# Patient Record
Sex: Male | Born: 1965 | Race: White | Hispanic: No
Health system: Western US, Academic
[De-identification: ages and names within clinical notes are randomized; demographics above are authoritative.]

## PROBLEM LIST (undated history)

## (undated) DIAGNOSIS — M459 Ankylosing spondylitis of unspecified sites in spine: Secondary | ICD-10-CM

## (undated) DIAGNOSIS — G4730 Sleep apnea, unspecified: Secondary | ICD-10-CM

## (undated) DIAGNOSIS — G2581 Restless legs syndrome: Secondary | ICD-10-CM

## (undated) DIAGNOSIS — K219 Gastro-esophageal reflux disease without esophagitis: Secondary | ICD-10-CM

## (undated) DIAGNOSIS — J3089 Other allergic rhinitis: Secondary | ICD-10-CM

## (undated) HISTORY — DX: Restless legs syndrome: G25.81

## (undated) HISTORY — PX: PR UNLISTED PROCEDURE SHOULDER: 23929

## (undated) HISTORY — DX: Other allergic rhinitis: J30.89

## (undated) HISTORY — DX: Sleep apnea, unspecified: G47.30

## (undated) HISTORY — PX: PR CHOLECYSTECTOMY: 47600

## (undated) HISTORY — DX: Ankylosing spondylitis of unspecified sites in spine: M45.9

## (undated) HISTORY — DX: Gastro-esophageal reflux disease without esophagitis: K21.9

## (undated) MED ORDER — LOSARTAN POTASSIUM 50 MG OR TABS
ORAL_TABLET | ORAL | Status: AC
Start: 2012-03-17 — End: ?

## (undated) MED ORDER — TIZANIDINE HCL 4 MG OR TABS
4.0000 mg | ORAL_TABLET | Freq: Three times a day (TID) | ORAL | 0 refills | Status: AC | PRN
Start: 2023-09-30 — End: ?

## (undated) MED ORDER — TIZANIDINE HCL 4 MG OR TABS
4.0000 mg | ORAL_TABLET | Freq: Three times a day (TID) | ORAL | 0 refills | Status: AC | PRN
Start: 2023-10-07 — End: ?

## (undated) MED ORDER — METFORMIN HCL ER 500 MG OR TB24
EXTENDED_RELEASE_TABLET | ORAL | Status: AC
Start: 2015-09-18 — End: ?

## (undated) MED ORDER — TIZANIDINE HCL 4 MG OR TABS
4.0000 mg | ORAL_TABLET | Freq: Three times a day (TID) | ORAL | 0 refills | Status: AC | PRN
Start: 2023-10-13 — End: ?

## (undated) MED ORDER — CLARITHROMYCIN 500 MG OR TABS
ORAL_TABLET | ORAL | Status: AC
Start: 2014-10-18 — End: ?

## (undated) MED ORDER — TIZANIDINE HCL 4 MG OR TABS
4.0000 mg | ORAL_TABLET | Freq: Three times a day (TID) | ORAL | 0 refills | Status: AC | PRN
Start: 2023-10-09 — End: ?

---

## 2006-06-24 HISTORY — PX: PR COLONOSCOPY STOMA DX INCLUDING COLLJ SPEC SPX: 44388

## 2006-06-24 HISTORY — PX: PR ESOPHAGOGASTRODUODENOSCOPY TRANSORAL DIAGNOSTIC: 43235

## 2006-11-18 ENCOUNTER — Encounter (INDEPENDENT_AMBULATORY_CARE_PROVIDER_SITE_OTHER): Payer: Self-pay | Admitting: Family Medicine

## 2006-11-18 DIAGNOSIS — R197 Diarrhea, unspecified: Secondary | ICD-10-CM

## 2007-05-31 ENCOUNTER — Encounter (INDEPENDENT_AMBULATORY_CARE_PROVIDER_SITE_OTHER): Payer: Self-pay | Admitting: Family Medicine

## 2007-07-14 ENCOUNTER — Encounter (INDEPENDENT_AMBULATORY_CARE_PROVIDER_SITE_OTHER): Payer: Self-pay | Admitting: Family Medicine

## 2007-07-14 DIAGNOSIS — M25519 Pain in unspecified shoulder: Secondary | ICD-10-CM

## 2007-12-13 IMAGING — CT CT ABDOMEN W/ CM
2 of 5 series · 17 of 46 positions shown, 19 images · IV contrast (READICAT/WATER & [ID] OMNI 300)
Comparison: None.

CLINICAL DATA: Left sided abdominal and pelvic pain. 
 ABDOMEN CT WITH CONTRAST:
TECHNIQUE: Multidetector CT imaging of the abdomen was performed following the standard protocol during bolus administration of intravenous contrast.
 Contrast:  888cc Omnipaque 300 and oral contrast.
TECHNIQUE: Multidetector CT imaging of the pelvis was performed following the standard protocol during bolus administration of intravenous contrast.

[Series 3: routine abdomen · axial · 0.78mm/px · z∈[-455,-40]mm · 14 of 92 slices shown, 16 images]
[im 5/92  soft-tissue]
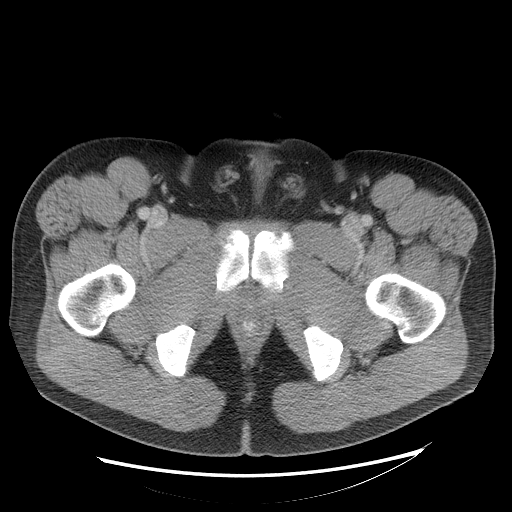
[im 5/92  bone]
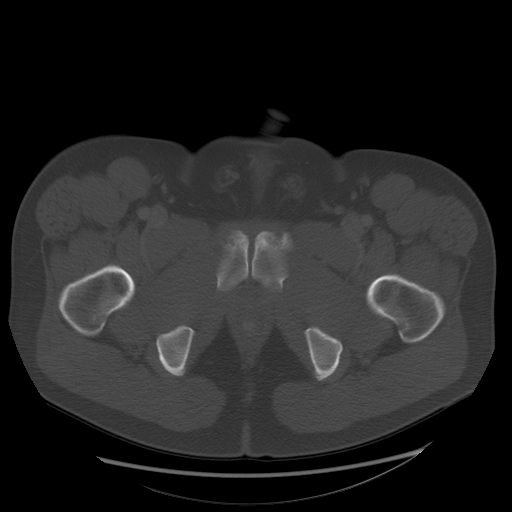
[im 10/92  soft-tissue]
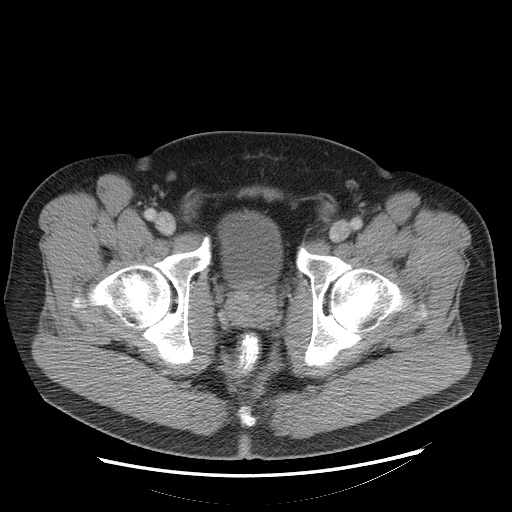
[im 20/92  soft-tissue]
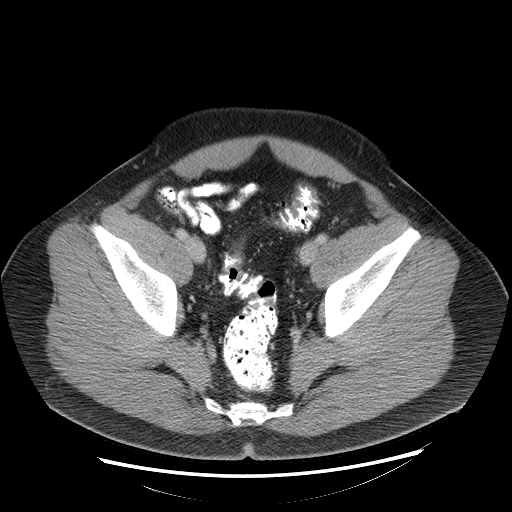
[im 24/92  soft-tissue]
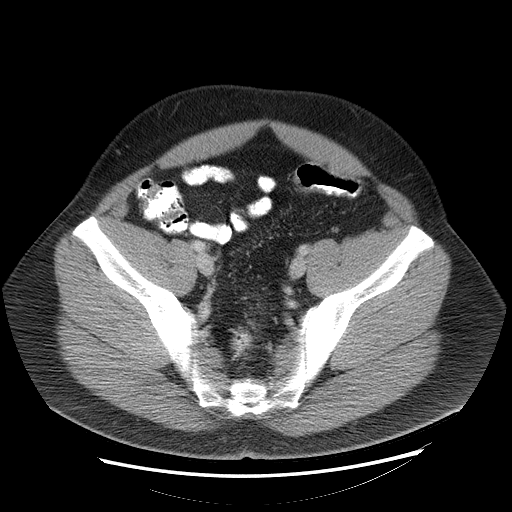
[im 29/92  soft-tissue]
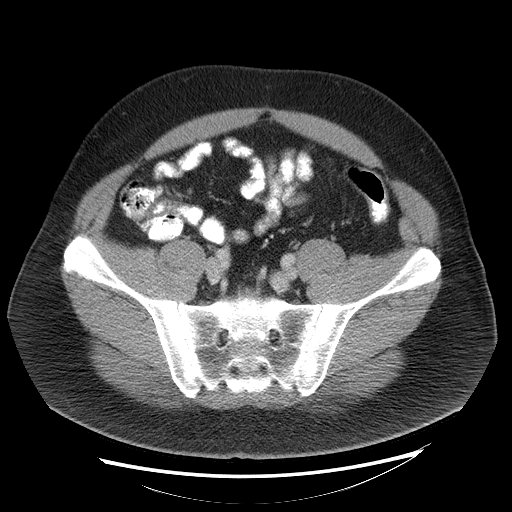
[im 39/92  soft-tissue]
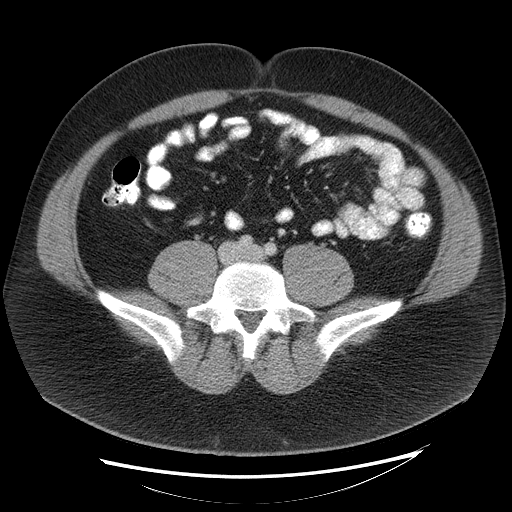
[im 44/92  soft-tissue]
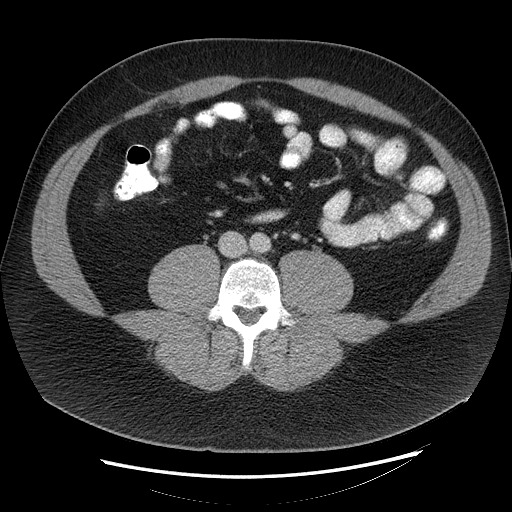
[im 48/92  soft-tissue]
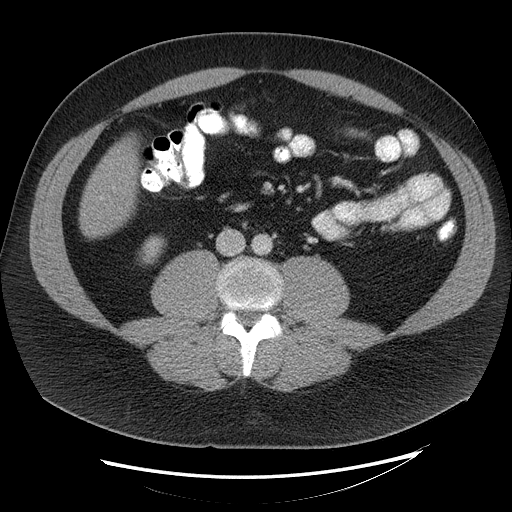
[im 53/92  soft-tissue]
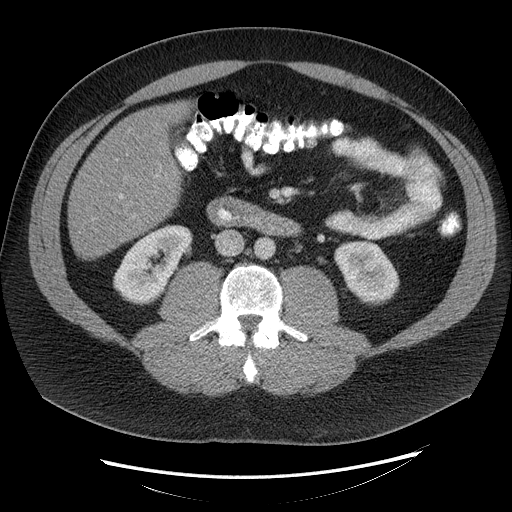
[im 53/92  bone]
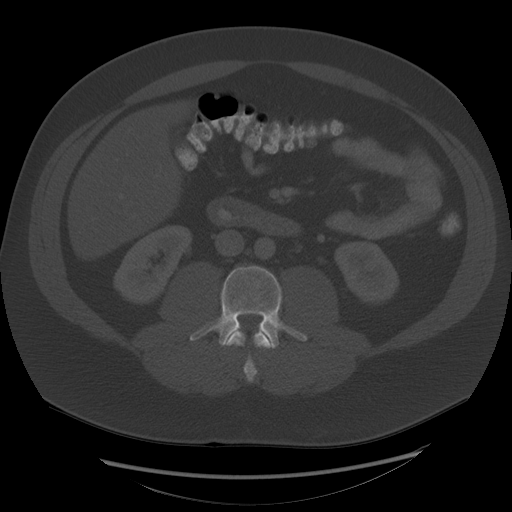
[im 63/92  soft-tissue]
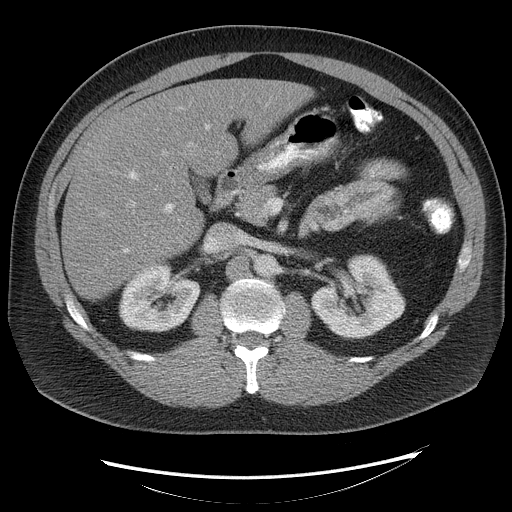
[im 68/92  soft-tissue]
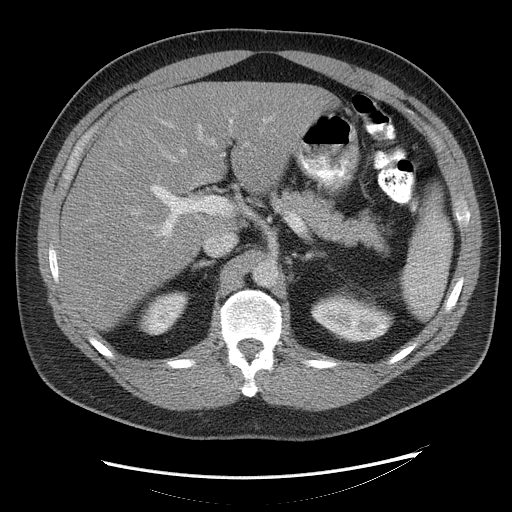
[im 72/92  soft-tissue]
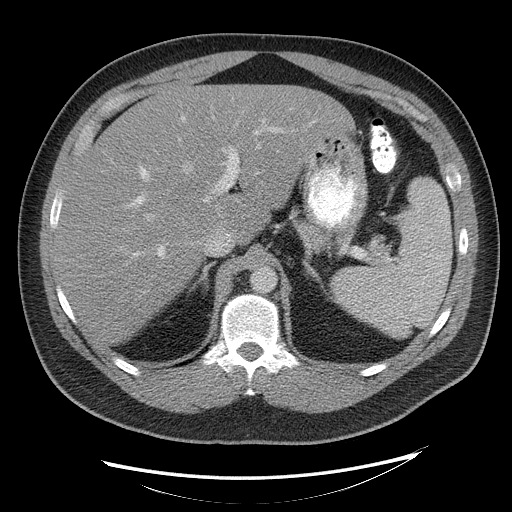
[im 82/92  soft-tissue]
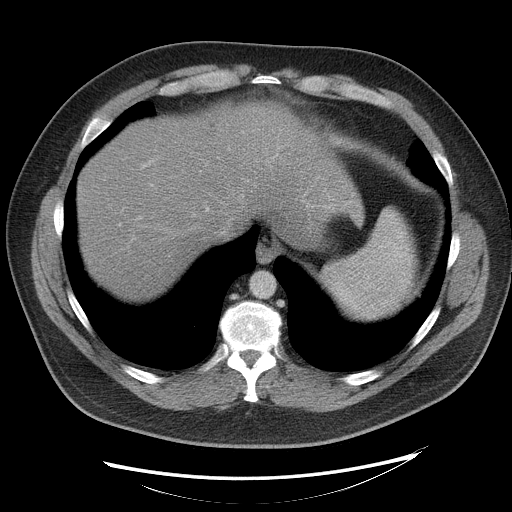
[im 87/92  soft-tissue]
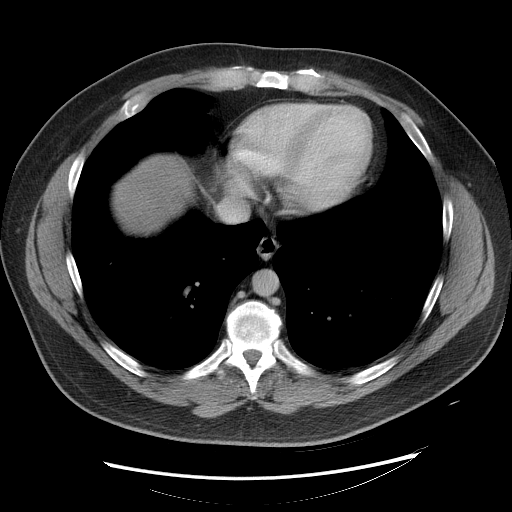

[Series 602: sagittal body · sagittal · 0.92mm/px · 3 of 161 slices shown]
[im 54/161  soft-tissue]
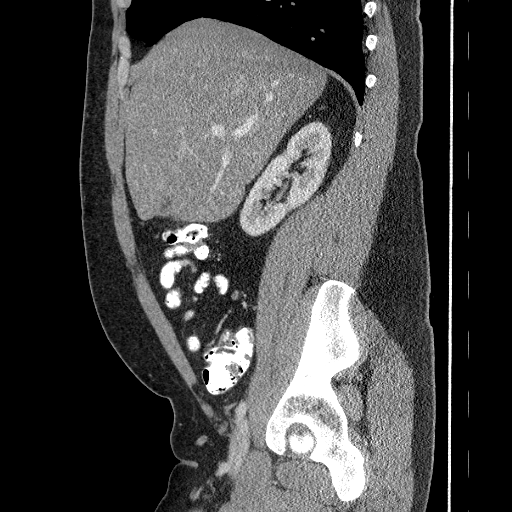
[im 72/161  soft-tissue]
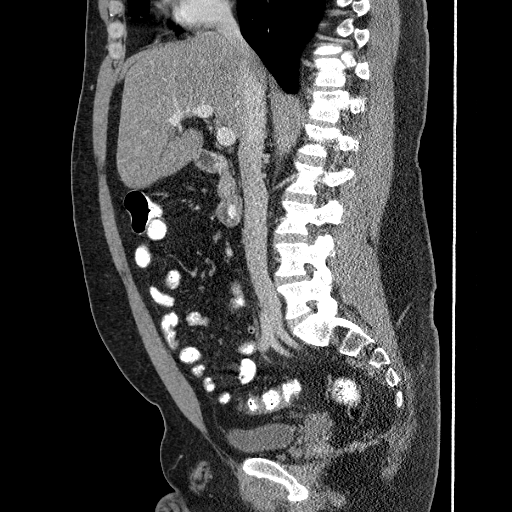
[im 89/161  soft-tissue]
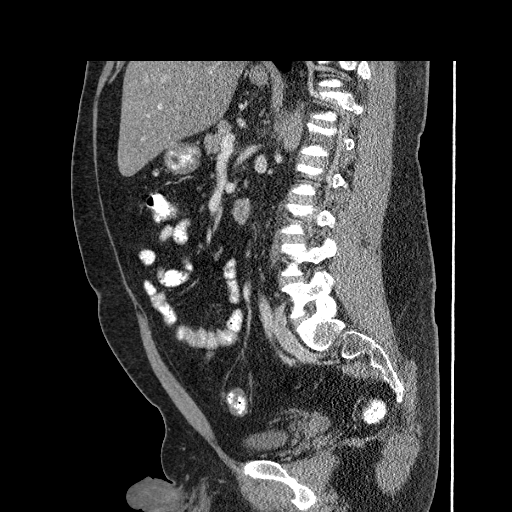

[17 of 46 positions shown; findings below may reference images not displayed]

FINDINGS: Diffuse fatty infiltration of the liver is noted however no focal liver lesions are identified.  The gallbladder is unremarkable and there is no evidence of biliary ductal dilatation.  The spleen, pancreas, adrenal glands, and kidneys are normal in appearance.  
 There is no evidence of abnormal soft tissue masses or adenopathy.  There is no evidence of inflammatory process or abnormal fluid collections within the abdomen.  Abdominal bowel loops are unremarkable.
IMPRESSION: 1.  No acute findings. 
 2.  Diffuse fatty infiltration of the liver. 
 PELVIS CT WITH CONTRAST:
FINDINGS: There is no evidence of pelvic mass or adenopathy.  There is no evidence of inflammatory process or abnormal fluid collection.  Pelvic bowel loops and other soft tissue structures are unremarkable.
IMPRESSION: Negative pelvis CT.

## 2008-01-19 IMAGING — US US ABDOMEN COMPLETE
1 series · 14 of 25 positions shown · non-contrast
Comparison: none

CLINICAL DATA: Abdominal pain, nausea, and vomiting.  
ABDOMEN ULTRASOUND COMPLETE ? 12/19/06:
TECHNIQUE: Complete abdominal ultrasound examination was performed including evaluation of the liver, gallbladder, bile ducts, pancreas, kidneys, spleen, IVC, and abdominal aorta.

[Series 1: unknown · 0.39mm/px · 14 of 50 slices shown]
[im 1/50]
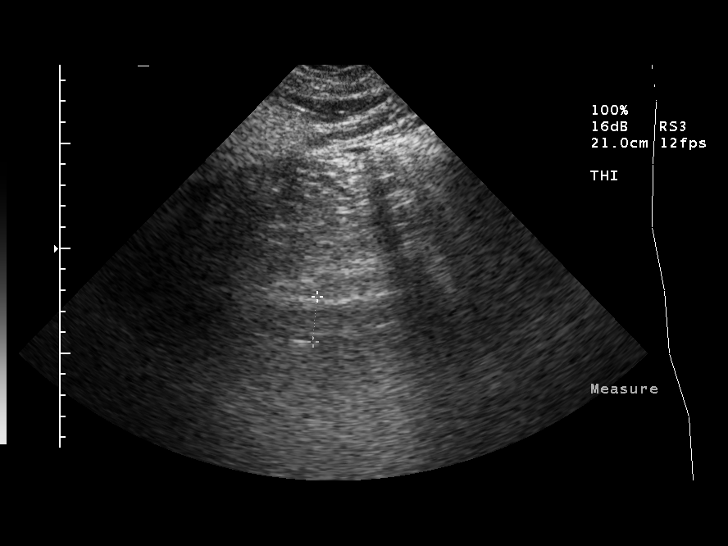
[im 5/50]
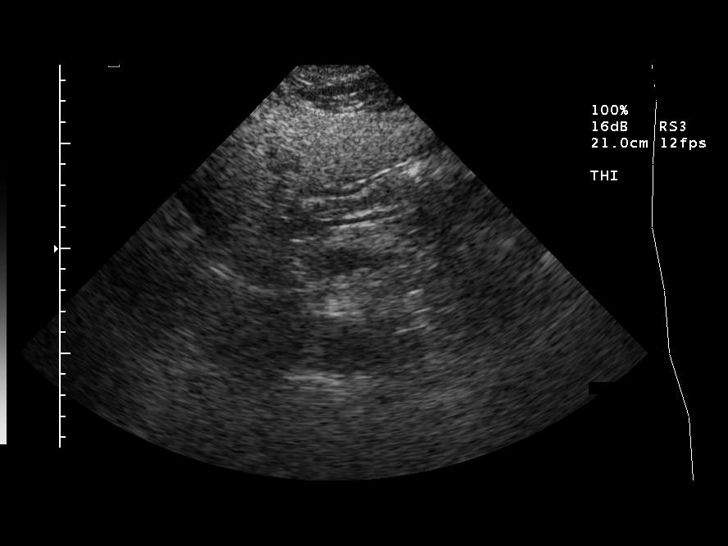
[im 9/50]
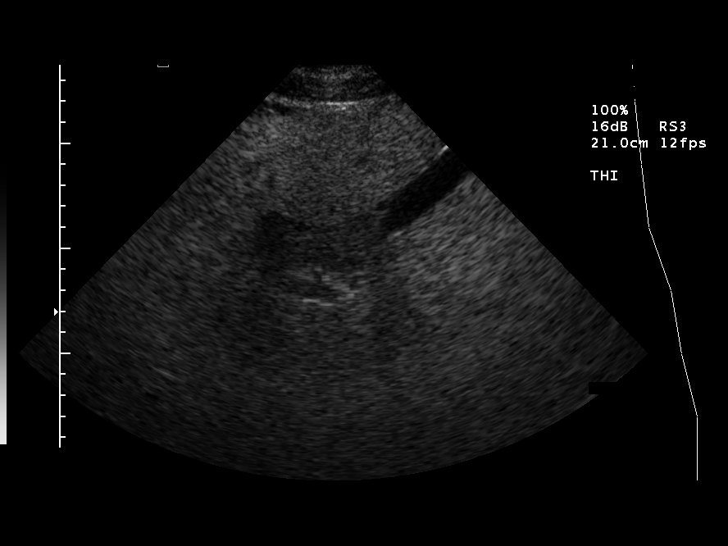
[im 13/50]
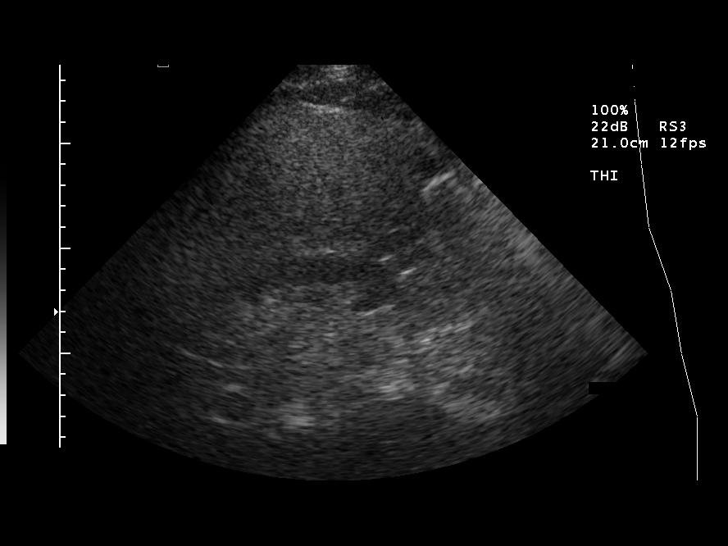
[im 17/50]
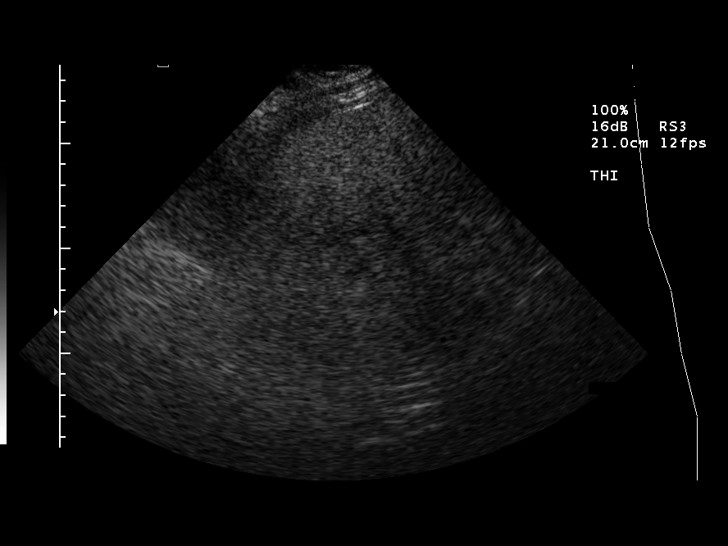
[im 19/50]
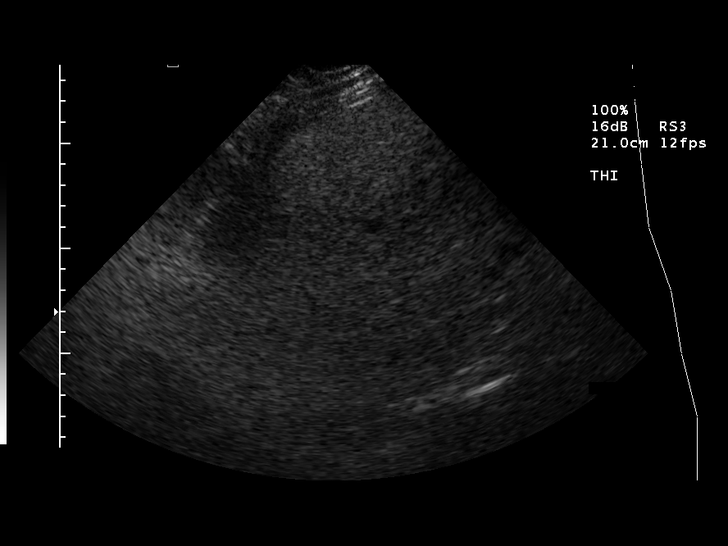
[im 23/50]
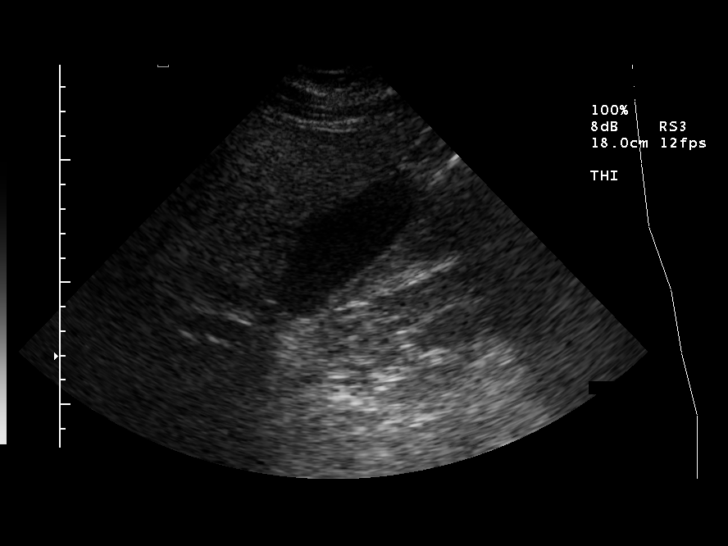
[im 27/50]
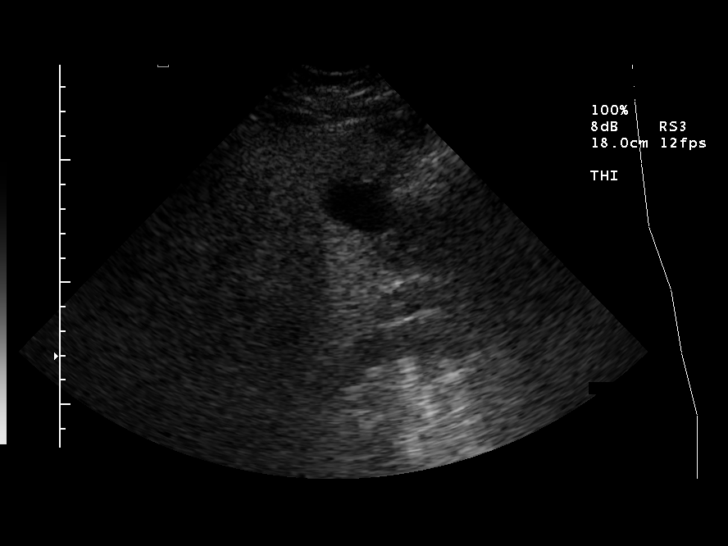
[im 31/50]
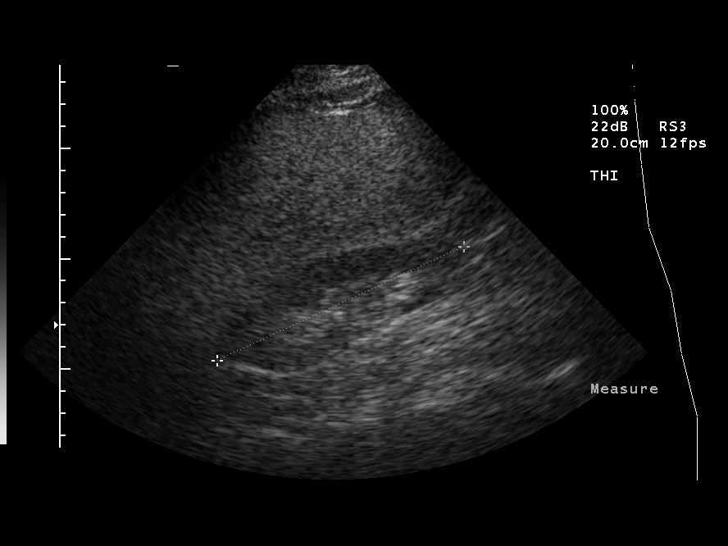
[im 33/50]
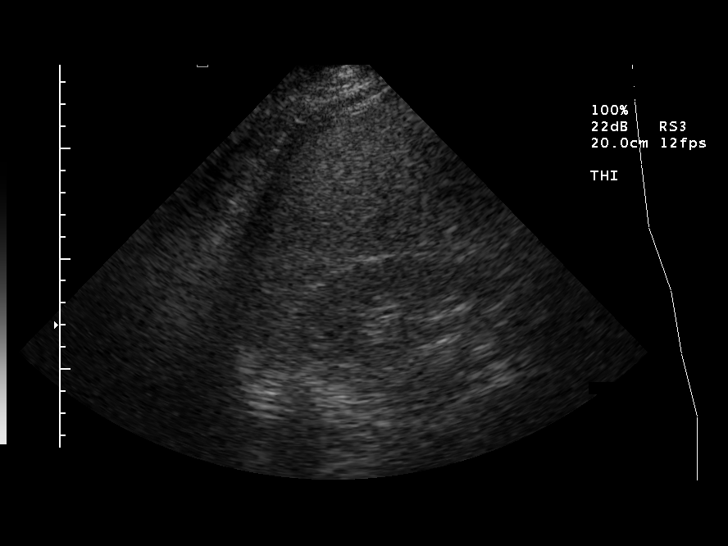
[im 37/50]
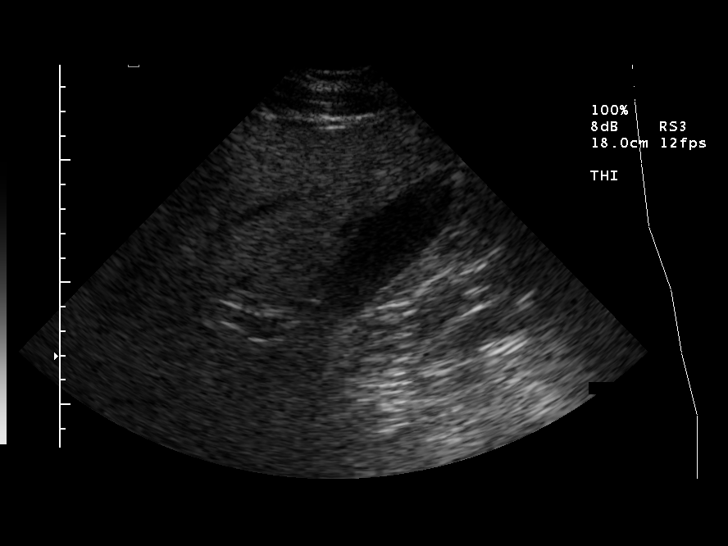
[im 41/50]
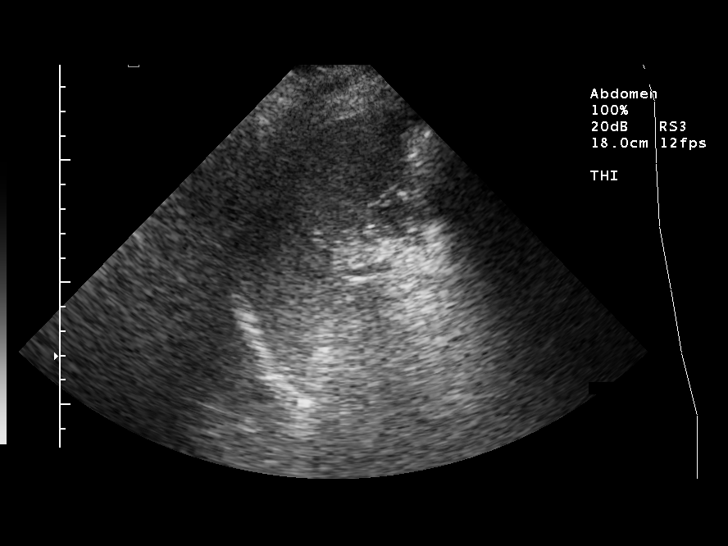
[im 45/50]
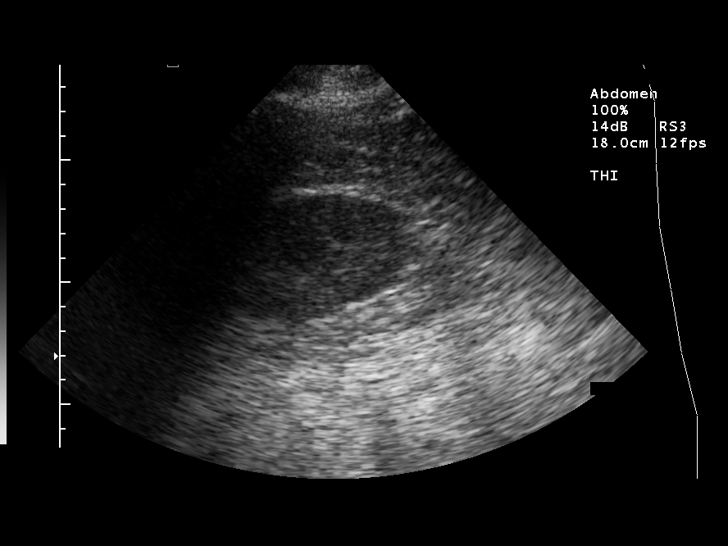
[im 50/50]
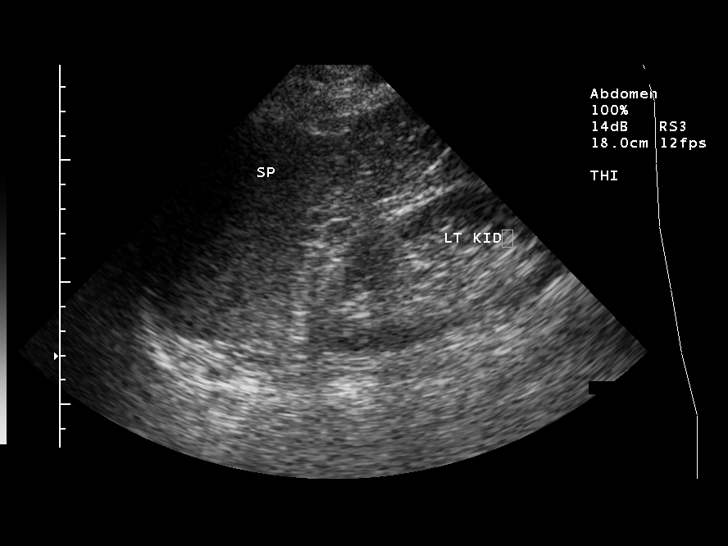

[14 of 25 positions shown; findings below may reference images not displayed]

FINDINGS: Normal gallbladder.  Gallbladder wall thickness is 0.7 mm.  The common duct measures 5.2 mm which is within normal limits.  There is diffusely increased hepatic echogenicity.  No hepatic space-occupying lesion.  The pancreas, spleen, and kidneys are unremarkable. The spleen measures 11.5 cm in length. The right and left kidneys measure 12.3 cm and 11.2 cm in length, respectively. The abdominal aorta maximal diameter is 2.2 cm.  The inferior vena cava cannot be visualized.
IMPRESSION: Normal gallbladder.  Diffuse hepatocellular disease, most likely fatty infiltration.  No biliary ductal dilatation.

## 2008-12-16 ENCOUNTER — Encounter (INDEPENDENT_AMBULATORY_CARE_PROVIDER_SITE_OTHER): Payer: Self-pay | Admitting: Medical

## 2008-12-16 ENCOUNTER — Ambulatory Visit (INDEPENDENT_AMBULATORY_CARE_PROVIDER_SITE_OTHER): Payer: HMO | Admitting: Medical

## 2008-12-16 DIAGNOSIS — F3289 Other specified depressive episodes: Secondary | ICD-10-CM

## 2008-12-16 DIAGNOSIS — J309 Allergic rhinitis, unspecified: Secondary | ICD-10-CM

## 2008-12-16 HISTORY — DX: Other specified depressive episodes: F32.89

## 2008-12-16 MED ORDER — PROAIR HFA 108 (90 BASE) MCG/ACT IN AERS
INHALATION_SPRAY | RESPIRATORY_TRACT | Status: DC
Start: 2008-12-16 — End: 2009-06-08

## 2008-12-16 MED ORDER — FLUTICASONE PROPIONATE 50 MCG/ACT NA SUSP
NASAL | Status: DC
Start: 2008-12-16 — End: 2009-06-08

## 2008-12-16 MED ORDER — ZOLOFT 100 MG OR TABS
ORAL_TABLET | ORAL | Status: DC
Start: 2008-12-16 — End: 2009-04-04

## 2008-12-16 MED ORDER — PREDNISONE 10 MG OR TABS
ORAL_TABLET | ORAL | Status: DC
Start: 2008-12-16 — End: 2009-01-19

## 2008-12-16 NOTE — Progress Notes (Signed)
S:  Anthony Andrade is a 43 year old patient who presents today and complains of rhinorrhea and coughing for the past 4 weeks.  No sick contacts.  No recent travel.  Good po intake and urine output.    Started Zithromax 6/22.    Was seen at another provider's office.  Symptoms haven't improved at all.   Feeling like he is short of breath.  No history of asthma, but sister is an Charity fundraiser and she was wondering if this what he has.      ROS:  Constitutional: Admits to fatigue.  Denies fever, chills.    Eyes: Denies changes in vision, eye pain, or eye discharge.    Ears:  Denies ear pain, ear discharge  Nose:  Admits to clear rhinorrhea, no sinus pain.    Throat:  Denies sore throat, difficulty swallowing.    Cardiovascular: Denies chest pain, palpitations, dyspnea on exertion  Respiratory: see above  Gastrointestinal: Denies abdominal pain, nausea, vomiting, and diarrhea    Patient Active Problem List   Diagnoses Code   . DEPRESSIVE DISORDER NEC 311       Review of patient's allergies indicates:  No Known Allergies         No medications prior to encounter.         OBJECTIVE:  Blood pressure 122/80, pulse 81, temperature 98.4 F (36.9 C), temperature source Oral, resp. rate 12, weight 258 lb (117.028 kg).  GENERAL: Well developed, well nourished patient in no acute distress.  Alert and oriented x3.    HYDRATION:  Moist mucous membranes, good skin turgor.    SKIN:  No rashes or lesions noted.    HEAD:  Normocephalic, atraumatic.    EYES:  PERLA.  EOMI.  No conjuctival erythema or discharge.    EARS:  Canals patent bilaterally.  TMs gray with visible bony landmarks and mobile on insufflation.    NOSE:  Clear Rhinorrhea.  No sinus tenderness.    MOUTH:  No mucosal lesions. Good dentition.    PHARYNX:  Pharynx pink.  No tonsillar hypertrophy or exudate.  Uvula midline.    NECK:  Supple, nontender.  No lymphadenopathy.   PULM:  Mild wheezing bilaterally.    CV:  RRR.  Nl S1 and S2 without murmurs rubs or gallops.       A:  Bronchitis  Asthma    P:    Nebulizer given in office with improvement of wheezing.  Continue Zithromax and add medications as below.  Recheck in 1-2 weeks or sooner if symptoms worse.    Discussed use of other the counter products to alleviate symptoms.  Encouraged fluid, rest, handwashing.  Dx, Tx, risks and alternatives discussed with patient and questions answered. Return to clinic if symptoms increase, persist, or worsen.    Face to face consultation regarding HPI, exam, review of differential diagnoses, rationale for decision-making and use of medications including answering all questions and concerns, lasting 30 min with >= 50% of the time spent counseling on above issues.

## 2008-12-19 ENCOUNTER — Telehealth (INDEPENDENT_AMBULATORY_CARE_PROVIDER_SITE_OTHER): Payer: Self-pay | Admitting: Medical

## 2008-12-19 NOTE — Telephone Encounter (Signed)
Noted  See him on my schedule  Closing TE.

## 2008-12-19 NOTE — Telephone Encounter (Signed)
Triage Nurse Telephone Encounter  Chief Complaint: congestion, shortness of breath  Reviewed Problem List, Medications and Allergies: YES    Description of symptoms: Pt saw Bryna on Friday for asthma-type symptoms, was given nebulizer in clinic. Pt notes feeling better yesterday and intended to go to work today but after walking dog in am he became SOB and today is feeling congested. Pt unsure if from overexertion or maybe pollen. Pt wondering what to do about these symptoms. Pt denies chest pain or feeling of suffocation. Pt speaking in complete sentences.     ROS: negative per protocol except as noted in history  Protocol used for assessment: breathing problems  Recommended disposition: Made future appointment  Caller agrees:  YES    PLAN: No openings in clinic for Pt to be seen today. Pt made appt with Idelia Salm for tomorrow am and understands reasoning to go to ER sooner PRN.  Home care instructions provided:  YES   Instructed to call back or seek tx if sxs worsen or has new concerns: YES   Caller understands:  YES  Follow up call needed  NO  Reference used: [employer] 3rd edition, Telephone Triage Protocols for Nurses

## 2008-12-20 ENCOUNTER — Ambulatory Visit (INDEPENDENT_AMBULATORY_CARE_PROVIDER_SITE_OTHER): Payer: Self-pay | Admitting: Nurse Practitioner

## 2008-12-20 NOTE — Telephone Encounter (Addendum)
LM for Pt that it was noted his appt for today was cancelled. Encouraged Pt to call back to make a new appt if needed.

## 2008-12-20 NOTE — Telephone Encounter (Addendum)
Clinical Staff, I see that the Pt cancelled his appt today. Please call to find out if he plans to reschedule or if symptoms are improving. Thanks.

## 2009-01-04 ENCOUNTER — Ambulatory Visit (INDEPENDENT_AMBULATORY_CARE_PROVIDER_SITE_OTHER): Payer: HMO | Admitting: Medical

## 2009-01-10 ENCOUNTER — Encounter (INDEPENDENT_AMBULATORY_CARE_PROVIDER_SITE_OTHER): Payer: Self-pay | Admitting: Medical

## 2009-01-19 ENCOUNTER — Encounter (INDEPENDENT_AMBULATORY_CARE_PROVIDER_SITE_OTHER): Payer: Self-pay | Admitting: Nurse Practitioner

## 2009-01-19 ENCOUNTER — Ambulatory Visit (INDEPENDENT_AMBULATORY_CARE_PROVIDER_SITE_OTHER): Payer: HMO | Admitting: Nurse Practitioner

## 2009-01-19 VITALS — BP 126/80 | HR 86 | Temp 97.7°F | Resp 12 | Wt 258.0 lb

## 2009-01-19 DIAGNOSIS — J309 Allergic rhinitis, unspecified: Secondary | ICD-10-CM

## 2009-01-19 DIAGNOSIS — J209 Acute bronchitis, unspecified: Secondary | ICD-10-CM

## 2009-01-19 HISTORY — DX: Allergic rhinitis, unspecified: J30.9

## 2009-01-19 MED ORDER — GUAIFENESIN-CODEINE 100-10 MG/5ML OR LIQD
ORAL | Status: DC
Start: 2009-01-19 — End: 2009-06-08

## 2009-01-19 MED ORDER — AZITHROMYCIN 250 MG OR TABS
ORAL_TABLET | ORAL | Status: AC
Start: 2009-01-19 — End: 2009-01-24

## 2009-01-19 NOTE — Progress Notes (Signed)
SUBJECTIVE:  Anthony Andrade is an 43 year old male who presents with URI. Symptoms include   congestion, cough, ear pain bilateral, sore throat and fatigue. Onset 5 days, unchanged since   that time. Admits to allergies and taking claritin but just started this 4 days ago, has had allergies whole life. Usually during spring and summer. Taking OTC cough medication. Denies: fever, chills, headache       Prior Encounter Medications   Medication Sig Dispense Refill   . ALBUTEROL SULFATE (PROAIR HFA) 108 (90 BASE) MCG/ACT IN AERS 2 puffs inhaled every 4 hours as needed for asthma; may use every 2 hours for attacks, or preventively before exercise  1  2   . Fluticasone Propionate 50 MCG/ACT NA SUSP 1-2 puffs each nostril once or twice daily for prevention/control of nasal/sinus congestion  3 MDI  3   . PredniSONE 10 MG OR TABS take 2 tabs today, then take one per day for 5 more days  7  0   . Sertraline HCl (ZOLOFT) 100 MG OR TABS Take 1 tablet by mouth every night for mood  90  0         Review of patient's allergies indicates:  No Known Allergies    History   Substance Use Topics   . Tobacco Use: Never   . Alcohol Use: Not on file   Patient Active Problem List   Diagnoses Code   . DEPRESSIVE DISORDER NEC 311   . ALLERGIC RHINITIS NOS 477.9       No past medical history on file.  Past Surgical History   Procedure Date   . Removal gallbladder    . Shoulder surg proc unlisted      left shoulder       History   Social History   . Marital Status: N/A     Spouse Name: N/A     Number of Children: N/A   . Years of Education: N/A   Occupational History   . Not on file.   Social History Main Topics   . Tobacco Use: Never   . Alcohol Use: Not on file   . Drug Use: Not on file   . Sexually Active: Not on file   Other Topics Concern   . Not on file   Social History Narrative    Recently moved from [place].  Works for [employer].  One daughter.  Sister is an Charity fundraiser.              OBJECTIVE:  BP 126/80  Pulse 86  Temp 97.7 F (36.5 C)   Resp 12  Wt 258 lb (117.028 kg)  SpO2 98%  General appearance: healthy, alert, no distress  Ears: R TM - normal, L TM - normal  Nose: congested, clear secretions in nose  Oropharynx: mild erythema  Neck: normal, supple and no adenopathy  Lungs: clear to auscultation and forced expiratory wheezes  Heart: normal rate, regular rhythm and no murmurs, clicks, or gallops    ASSESSMENT:  466.0 ACUTE BRONCHITIS  (primary encounter diagnosis)  Comment: discussed diagnosis, medication use and SE.  Plan: AZITHROMYCIN 250 MG OR TABS,         GUAIFENESIN-CODEINE 100-10 MG/5ML OR LIQD            477.9 ALLERGIC RHINITIS NOS  Comment: discussed need to treat allergies to decrease secondary infections  Plan: note for work written        PLAN:  1)  Symptomatic treatment  with fluids, vaporizer, acetaminophen.  2)  Recheck as needed for persistence, worsening, appearance of new   symptoms.

## 2009-01-23 ENCOUNTER — Ambulatory Visit (INDEPENDENT_AMBULATORY_CARE_PROVIDER_SITE_OTHER): Payer: HMO | Admitting: Family Medicine

## 2009-04-04 ENCOUNTER — Telehealth (INDEPENDENT_AMBULATORY_CARE_PROVIDER_SITE_OTHER): Payer: Self-pay | Admitting: Medical

## 2009-04-04 NOTE — Telephone Encounter (Signed)
Medication Refill Documentation    Name of Medication: Sertraline    Prescribing provider:  Remer Macho, PA-C    Protocol used (by medication type, not necessarily patient diagnosis): Adult:  ANTIDEPRESSANTS - OK to refill for 3 months unless last chart note specifies otherwise, patient must be seen annually.    Date last filled: 12/16/08    Date last seen for this issue:  12/16/08      Last 2 Encounter BP Readings:   Date BP   01/19/2009 126/80   12/16/2008 122/80           Next scheduled appointment date:  Visit date not found

## 2009-04-05 MED ORDER — ZOLOFT 100 MG OR TABS
ORAL_TABLET | ORAL | Status: DC
Start: 2009-04-04 — End: 2009-05-26

## 2009-04-05 NOTE — Telephone Encounter (Signed)
Done! Closing

## 2009-05-26 ENCOUNTER — Encounter (INDEPENDENT_AMBULATORY_CARE_PROVIDER_SITE_OTHER): Payer: Self-pay | Admitting: Family Medicine

## 2009-05-26 ENCOUNTER — Ambulatory Visit (INDEPENDENT_AMBULATORY_CARE_PROVIDER_SITE_OTHER): Payer: HMO | Admitting: Family Medicine

## 2009-05-26 VITALS — BP 140/88 | HR 76 | Temp 98.5°F | Wt 264.0 lb

## 2009-05-26 DIAGNOSIS — F3289 Other specified depressive episodes: Secondary | ICD-10-CM

## 2009-05-26 DIAGNOSIS — R197 Diarrhea, unspecified: Secondary | ICD-10-CM

## 2009-05-26 DIAGNOSIS — R110 Nausea: Secondary | ICD-10-CM

## 2009-05-26 LAB — CBC (HEMOGRAM)
Hematocrit: 41 % (ref 38–50)
Hemoglobin: 14.1 g/dL (ref 13.0–18.0)
MCH: 28.8 pg (ref 27.3–33.6)
MCHC: 34.1 g/dL (ref 32.2–36.5)
MCV: 85 fL (ref 81–98)
Platelet Count: 230 10*3/uL (ref 150–400)
RBC: 4.89 mil/uL (ref 4.40–5.60)
RDW-CV: 13.5 % (ref 11.6–14.4)
WBC: 7.96 10*3/uL (ref 4.3–10.0)

## 2009-05-26 MED ORDER — ZOLOFT 100 MG OR TABS
ORAL_TABLET | ORAL | Status: DC
Start: 2009-05-26 — End: 2009-08-28

## 2009-05-26 NOTE — Progress Notes (Signed)
SUBJECTIVE:  Anthony Andrade is an 43 year old male who presents with diarrhea and nausea        Onset:3 weeks back.     Frequency:  3-4 times/ 24 hours.  Last episode: this AM.    Stools are described as loose  Blood or mucous:NO    Denies history of  vomiting, constipation, melena, hematochezia, dysuria, frequency, vaginal discharge, belching, flatus, fever, chills, sweats, arthralgias, myalgias, headache, chest pain and cough  States that the nausea comes and goes    Other family members affected - not known.     Recent travel?  NO    Recent NSAIDs/antibiotics?  NO    Requests refill on zoloft      No past medical history on file.    Past Surgical History   Procedure Date   . Removal gallbladder    . Shoulder surg proc unlisted      left shoulder              Prior Encounter Medications   Medication Sig Dispense Refill   . ALBUTEROL SULFATE (PROAIR HFA) 108 (90 BASE) MCG/ACT IN AERS 2 puffs inhaled every 4 hours as needed for asthma; may use every 2 hours for attacks, or preventively before exercise  1  2   . Fluticasone Propionate 50 MCG/ACT NA SUSP 1-2 puffs each nostril once or twice daily for prevention/control of nasal/sinus congestion  3 MDI  3   . Guaifenesin-Codeine 100-10 MG/5ML OR LIQD 1-2 tsp at bedtime, as needed for cough, may repeat in 4 hours  180 ml  0   . Sertraline HCl (ZOLOFT) 100 MG OR TABS Take 1 tablet by mouth every night for mood  90  0         Review of patient's allergies indicates:  Allergies   Allergen Reactions   . Morphine Nausea/Vomiting   . Penicillins Rash           History   Substance Use Topics   . Tobacco Use: Never   . Alcohol Use: Not on file       OBJECTIVE:  BP 130/88  Pulse 76  Temp 98.5 F (36.9 C)  Wt 264 lb (119.75 kg)  General appearance: healthy, alert, no distress  Hydration: well hydrated  HEENT:  Nose:  normal, Throat:  oropharynx w/o lesions, erythema, exudate; MMM and no tonsillar enlargement and Neck:  Neck supple. No adenopathy. Thyroid symmetric,  normal size, without nodules  Chest:  clear to auscultation  Abdomen:  soft, nontender, no palpable masses, no organomegaly  Bowel sounds:normal bowel sounds08}.   Rectal:deferred    ASSESSMENT/PLAN: see orders  1) Diarrhea Viral vs bacterial most likely.  Discussed potential etiologies.    PLAN:  1)  Symptomatic treatment.  May use OTC anti-diarrheals for 3 days.  2)  Clear liquids/ electrolyte solutions in frequent, small amounts, advance diet as   tolerated. Bland foods.  3)  Recheck prn persistence, worsening, appearance of new symptoms.  4)  Stool culture for enteric pathogens, WBC: YES   Understands will need additional; workup if symptoms persist    Depression:  zoloft refilled  Doing really well  F/u in 6 months

## 2009-05-27 LAB — COMPREHENSIVE METABOLIC PANEL
ALT (GPT): 54 U/L (ref 10–64)
AST (GOT): 37 U/L (ref 15–40)
Albumin: 3.8 g/dL (ref 3.5–5.2)
Alkaline Phosphatase (Total): 109 U/L (ref 39–139)
Anion Gap: 8 (ref 3–11)
Bilirubin (Total): 0.7 mg/dL (ref 0.2–1.3)
Calcium: 8.7 mg/dL — ABNORMAL LOW (ref 8.9–10.2)
Carbon Dioxide, Total: 26 mEq/L (ref 22–32)
Chloride: 103 mEq/L (ref 98–108)
Creatinine: 0.8 mg/dL (ref 0.2–1.1)
GFR, Calc, African American: >60 mL/min (ref >59)
GFR, Calc, European American: >60 mL/min (ref >59)
Glucose: 158 mg/dL — ABNORMAL HIGH (ref 62–125)
Potassium: 3.8 mEq/L (ref 3.7–5.2)
Protein (Total): 6.8 g/dL (ref 6.0–8.2)
Sodium: 137 mEq/L (ref 136–145)
Urea Nitrogen: 13 mg/dL (ref 8–21)

## 2009-05-27 LAB — THYROID STIMULATING HORMONE: Thyroid Stimulating Hormone: 2.178 u[IU]/mL (ref 0.400–5.000)

## 2009-05-30 ENCOUNTER — Other Ambulatory Visit (INDEPENDENT_AMBULATORY_CARE_PROVIDER_SITE_OTHER): Payer: Self-pay | Admitting: Family Medicine

## 2009-05-30 DIAGNOSIS — R197 Diarrhea, unspecified: Secondary | ICD-10-CM

## 2009-05-30 LAB — PR LEUKOCYTE COUNT, FECAL ONSITE: WBC: NEGATIVE

## 2009-05-31 ENCOUNTER — Ambulatory Visit (INDEPENDENT_AMBULATORY_CARE_PROVIDER_SITE_OTHER): Payer: HMO | Admitting: Family Medicine

## 2009-05-31 ENCOUNTER — Encounter (INDEPENDENT_AMBULATORY_CARE_PROVIDER_SITE_OTHER): Payer: Self-pay | Admitting: Family Medicine

## 2009-05-31 NOTE — Progress Notes (Signed)
Patient did not come to scheduled appointment

## 2009-06-01 ENCOUNTER — Encounter (INDEPENDENT_AMBULATORY_CARE_PROVIDER_SITE_OTHER): Payer: Self-pay | Admitting: Family Medicine

## 2009-06-01 LAB — H. PYLORI AG, STL: H. Pylori Ag, Stool (Sendout): NEGATIVE

## 2009-06-02 ENCOUNTER — Encounter (INDEPENDENT_AMBULATORY_CARE_PROVIDER_SITE_OTHER): Payer: Self-pay | Admitting: Medical

## 2009-06-02 LAB — EXTENDED STOOL PARASITE MICROSCOPIC EXAM

## 2009-06-02 LAB — STOOL C/S AND ENTERIC BATTERY

## 2009-06-05 ENCOUNTER — Ambulatory Visit (INDEPENDENT_AMBULATORY_CARE_PROVIDER_SITE_OTHER): Payer: HMO | Admitting: Family Medicine

## 2009-06-05 ENCOUNTER — Encounter (INDEPENDENT_AMBULATORY_CARE_PROVIDER_SITE_OTHER): Payer: Self-pay | Admitting: Family Medicine

## 2009-06-05 VITALS — BP 142/78 | HR 80 | Temp 98.5°F | Wt 260.0 lb

## 2009-06-05 DIAGNOSIS — L259 Unspecified contact dermatitis, unspecified cause: Secondary | ICD-10-CM

## 2009-06-05 DIAGNOSIS — Z13220 Encounter for screening for lipoid disorders: Secondary | ICD-10-CM

## 2009-06-05 DIAGNOSIS — Z131 Encounter for screening for diabetes mellitus: Secondary | ICD-10-CM

## 2009-06-05 MED ORDER — LOTRISONE 1-0.05 % EX CREA
TOPICAL_CREAM | CUTANEOUS | Status: DC
Start: 2009-06-05 — End: 2009-11-03

## 2009-06-05 NOTE — Progress Notes (Signed)
S:Patient is here for f/u results  Reports that the diarrhoea and nausea have almost resolved    Also wants to discuss  Rash in groin folds and  anal area  nonitchy  Desitin/vaseline havent helped  Nothing prescription tired so far  Ongoing for 6-8 months seems to be worsening  No rash elsewhere  No fever/chills      No FMH of rashes      O  BP 142/78 rechecked 130/80  Pulse 80  Temp 98.5 F (36.9 C)  Wt 260 lb (117.935 kg)  In gen in NAD    IN the groin folds bilaterally he has pinkish rash all along the fold approx 2-3 cm in width  Also has the same rash extending perianally        A/P    692.9 DERMATITIS NOS  (primary encounter diagnosis)  Comment: DDX fungal vs eczematous vs intertrigo  Keep dry  Sparingly try lotrisone  If unresolved in 1 week see dermatology  Plan: REFERRAL TO DERMATOLOGY           V77.1 SCREENING-DIABETES MELLITUS   Plan: A1C, ONSITE, RAPID, GLUCOSE SERUM, FASTING           V77.91 SCREENING FOR LIPOID DISORDERS  Plan: LIPID PANEL      Discussed with patient that the stool studies are normal  Considering that his symptoms are almost resolved no further recommendations now  F/u prn

## 2009-06-08 ENCOUNTER — Encounter (INDEPENDENT_AMBULATORY_CARE_PROVIDER_SITE_OTHER): Payer: Self-pay | Admitting: Internal Medicine

## 2009-06-08 ENCOUNTER — Other Ambulatory Visit (INDEPENDENT_AMBULATORY_CARE_PROVIDER_SITE_OTHER): Payer: HMO

## 2009-06-08 ENCOUNTER — Ambulatory Visit (INDEPENDENT_AMBULATORY_CARE_PROVIDER_SITE_OTHER): Payer: HMO | Admitting: Internal Medicine

## 2009-06-08 VITALS — BP 130/76 | HR 80 | Resp 16

## 2009-06-08 DIAGNOSIS — Z131 Encounter for screening for diabetes mellitus: Secondary | ICD-10-CM

## 2009-06-08 DIAGNOSIS — G4730 Sleep apnea, unspecified: Secondary | ICD-10-CM

## 2009-06-08 DIAGNOSIS — Z13220 Encounter for screening for lipoid disorders: Secondary | ICD-10-CM

## 2009-06-08 LAB — PR LIPID PANEL, ONSITE
Cholesterol (LDL): 124 mg/dL (ref ?–130)
Cholesterol/HDL Ratio: 11.4
HDL Cholesterol: 15 mg/dL — ABNORMAL LOW (ref 40–?)
Non-HDL Cholesterol: 159 mg/dL (ref 0–159)
Total Cholesterol: 174 mg/dL (ref ?–200)
Triglyceride: 173 mg/dL — ABNORMAL HIGH (ref ?–150)

## 2009-06-08 LAB — PR A1C RAPID, ONSITE: Hemoglobin A1C: 5.9 % (ref 4.0–6.0)

## 2009-06-08 NOTE — Progress Notes (Addendum)
Addended by: HOBBS, BREANNA on: 06/08/2009      Modules accepted: Orders

## 2009-06-08 NOTE — Progress Notes (Signed)
Anthony Andrade is a 43 year old male. Chief Complaint   Patient presents with   . Establish Care     w/ sleep doctor   . Follow-Up      CPAP-pt has had machine for 2 years         History of Present Illness: He was diagnosed with apnea a few years ago in [employer] He initially felt better with CPAP, but has felt worse over last year. He has gained weight. His wife notes his sleep is not as rtestful. He was briefly hitting in his sleep, which improved with CPAP, but has been recurrent. He reports he has been on SSRI for 5 years, he notes he does much better on this medication.       Past Surgical History   Procedure Date   . Removal gallbladder    . Shoulder surg proc unlisted      left shoulder   . Upper gi endoscopy,diagnosis 2008     on prevacid   . Colonoscopy 2008     normal per patient         Patient Active Problem List   Diagnoses Date Noted   ALLERGIC RHINITIS NOS [477.9] 01/19/2009      DEPRESSIVE DISORDER NEC [311] 12/16/2008               Current outpatient prescriptions   Medication Sig   . DISCONTD: Clotrimazole-Betamethasone (LOTRISONE) 1-0.05 % EX CREA Apply to affected area 2 times per day until rash clears   . Sertraline HCl (ZOLOFT) 100 MG OR TABS Take 1 tablet by mouth every night for mood           History   Substance Use Topics   . Tobacco Use: Never   . Alcohol Use: Not on file       Allergies:Morphine and Penicillins      ROS:  CONST: Fatique  ENDO: No DM  Exam:    Gen: healthy, alert, no distress  BP 130/76  Pulse 80  Resp 16  CURRENT CPAP Records only compliance  Exam otherwise deferred.    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 He felt better initially on CPAP. He had arm and leg kicks in sleep, better initially with CPAP, getting worse. He has been stable on SSRI, does much better on this medication. He has no RLS symptoms  Plan: REFERRAL TO SLEEP DISORDER [facility]        Discussed. No down load data with  regard to AHI. Likely under treated apnea. Repeat CPAP titration, with parasomnia motage to help distinguish between RBD vs arouals with apnea. He will follow-up in 4 weeks, sooner as needed. He understands and agrees with this plan      Face to face consultation regarding HPI, exam, review of differential diagnoses, rationale for decision-making and use of medications including answering all questions and concerns, lasting 25 min

## 2009-06-09 LAB — GLUCOSE, FASTING: Glucose, Fasting: 89 mg/dL (ref 62–125)

## 2009-06-19 ENCOUNTER — Encounter (INDEPENDENT_AMBULATORY_CARE_PROVIDER_SITE_OTHER): Payer: Self-pay | Admitting: Family Medicine

## 2009-06-19 ENCOUNTER — Ambulatory Visit (INDEPENDENT_AMBULATORY_CARE_PROVIDER_SITE_OTHER): Payer: HMO | Admitting: Family Medicine

## 2009-06-19 ENCOUNTER — Telehealth (INDEPENDENT_AMBULATORY_CARE_PROVIDER_SITE_OTHER): Payer: Self-pay | Admitting: Medical

## 2009-06-19 VITALS — BP 130/76 | HR 84 | Temp 98.5°F | Wt 257.0 lb

## 2009-06-19 DIAGNOSIS — M459 Ankylosing spondylitis of unspecified sites in spine: Secondary | ICD-10-CM

## 2009-06-19 MED ORDER — INDOMETHACIN ER 75 MG OR CPCR
EXTENDED_RELEASE_CAPSULE | ORAL | Status: DC
Start: 2009-06-19 — End: 2009-07-19

## 2009-06-19 MED ORDER — HYDROCODONE-ACETAMINOPHEN 5-500 MG OR TABS
ORAL_TABLET | ORAL | Status: AC
Start: 2009-06-19 — End: 2009-06-28

## 2009-06-19 NOTE — Telephone Encounter (Signed)
Left msg for pt to call , if he will see a rheumatologist prior to 06/23/09 , he will need to call and inform us of the provider he will see, we will then get prior auth from insurance,

## 2009-06-19 NOTE — Progress Notes (Signed)
SUBJECTIVE:   Anthony Andrade is a 43 year old male who complains of  upper back pain  on the right side radiating to the right side of the front of the chest  Pain feels like in muscle.states that he has ankylosing spondylitis that has presented like this in the past  No SOB/palpitation/cough/dizziness/fever  Started on 06/16/09.  NSAIDS and flexeril didn't help much  His AS was diagnosed in the 1990s and has had indocin in the past that helped  Not established with rheumatologist in this area  Denies any pain in other joints  Pain the chest worsens with any movements of the shoulder area,rolling over    OBJECTIVE:  Vital signs as noted above. Patient appears to be in mild pain,  Chest: tenderness over the right side of the chest wall back and front  FROM of the shoulder but painful  Chest : CTA  normal gait noted.   Lumbosacral spine area reveals no local tenderness or mass.     ASSESSMENT:   720.0 ANKYLOSING SPONDYLITIS  (primary encounter diagnosis)  Comment: discussed indocin  Advised not to take any other NSAID  Referral to rheumatology  vicodin for prn use  D/c muscle relaxant  F/u in 2 days if unimproved and earlier prn  Plan: REFERRAL TO RHEUMATOLOGY

## 2009-06-26 ENCOUNTER — Ambulatory Visit (HOSPITAL_BASED_OUTPATIENT_CLINIC_OR_DEPARTMENT_OTHER): Payer: BLUE CROSS/BLUE SHIELD | Attending: Internal Medicine

## 2009-06-26 DIAGNOSIS — G4733 Obstructive sleep apnea (adult) (pediatric): Secondary | ICD-10-CM | POA: Insufficient documentation

## 2009-07-05 ENCOUNTER — Telehealth (INDEPENDENT_AMBULATORY_CARE_PROVIDER_SITE_OTHER): Payer: Self-pay | Admitting: Internal Medicine

## 2009-07-05 ENCOUNTER — Encounter (INDEPENDENT_AMBULATORY_CARE_PROVIDER_SITE_OTHER): Payer: Self-pay | Admitting: Internal Medicine

## 2009-07-05 NOTE — Telephone Encounter (Signed)
Please offer appointment with me at [facility] 4 weeks after beginning CPAP, sooner if any problems. It is best to schedule on Thurs afternoon, as we usually have respiratory therapist here on Thurs afternoon.  She should be getting her CPAP in the next few days-if she has not received it soon please have him call me in the next few days.

## 2009-07-06 NOTE — Telephone Encounter (Signed)
Called patient and left voicemail.

## 2009-07-11 ENCOUNTER — Encounter (INDEPENDENT_AMBULATORY_CARE_PROVIDER_SITE_OTHER): Payer: Self-pay | Admitting: Family Medicine

## 2009-07-13 NOTE — Progress Notes (Signed)
Date Received: 07/11/2009    Request Sent to: [facility]    Request was sent via:Fax    Contact for follow up:faxed to [phone].  Received fax confirmation at 11:33 am.  Telephone number to follow up [phone].

## 2009-07-17 NOTE — Telephone Encounter (Signed)
Left msg as below.

## 2009-07-19 ENCOUNTER — Telehealth (INDEPENDENT_AMBULATORY_CARE_PROVIDER_SITE_OTHER): Payer: Self-pay | Admitting: Family Medicine

## 2009-07-19 NOTE — Telephone Encounter (Signed)
I had referred him to rheumatologist in 12/10  Has he seen one since?

## 2009-07-19 NOTE — Telephone Encounter (Signed)
Medication Refill Documentation    Name of Medication: Indomethacin    Prescribing provider:  Hedy Camara, M.D.    Protocol used (by medication type, not necessarily patient diagnosis): Adult: no protocol      Date last filled: 12/10     Date last seen for this issue:  12/10  Please advise does this patient need to be seen before refills?      Last 2 Encounter BP Readings:   Date BP   06/19/2009 130/76   06/08/2009 130/76           Next scheduled appointment date:  Visit date not found

## 2009-07-20 NOTE — Telephone Encounter (Signed)
Left a message to call back clinic

## 2009-07-24 MED ORDER — INDOMETHACIN ER 75 MG OR CPCR
EXTENDED_RELEASE_CAPSULE | ORAL | Status: DC
Start: 2009-07-19 — End: 2012-07-30

## 2009-07-24 NOTE — Telephone Encounter (Signed)
He hasn't seen one since, knows he needs to. He has been dealing with the sleep apnea and some other appointments you set up for him.

## 2009-07-24 NOTE — Telephone Encounter (Signed)
Patient notified

## 2009-07-24 NOTE — Telephone Encounter (Signed)
Left 2nd msg to call. 

## 2009-07-24 NOTE — Telephone Encounter (Signed)
Please Call again

## 2009-07-24 NOTE — Telephone Encounter (Signed)
faxed

## 2009-08-14 ENCOUNTER — Ambulatory Visit (INDEPENDENT_AMBULATORY_CARE_PROVIDER_SITE_OTHER): Payer: Self-pay | Admitting: Family Medicine

## 2009-08-14 VITALS — BP 130/80 | Temp 96.2°F | Wt 260.0 lb

## 2009-08-14 DIAGNOSIS — J019 Acute sinusitis, unspecified: Secondary | ICD-10-CM

## 2009-08-14 DIAGNOSIS — H1030 Unspecified acute conjunctivitis, unspecified eye: Secondary | ICD-10-CM

## 2009-08-14 MED ORDER — AZITHROMYCIN 250 MG OR TABS
ORAL_TABLET | ORAL | Status: AC
Start: 2009-08-14 — End: 2009-08-19

## 2009-08-14 MED ORDER — MOXIFLOXACIN HCL 0.5 % OP SOLN
OPHTHALMIC | Status: DC
Start: 2009-08-14 — End: 2009-08-28

## 2009-08-14 NOTE — Progress Notes (Addendum)
Called [facility].  Records have not been processed at this time. According to staff, they are the next records request to be processed.

## 2009-08-14 NOTE — Progress Notes (Signed)
SUBJECTIVE  43 c/o cough, congestion, runny nose, right sided sinus pain and pressure worsening over the past 1 weeks. Sx started off as mild postnasal drip, congestion, seemed to improve, now are worsening.  He has not had fever and chills. No vomiting or diarrhea.   In addition, he started getting irritation and redness in his right eye over the weekend and symptoms seem to be worsening. He does c/o some photophobia on the right and copious discharge. No visual changes.    ROS:  RESP:NEG for SOB  GI:NEG    Objective:  Blood pressure 130/80, temperature 96.2 F (35.7 C), weight 260 lb (117.935 kg).  GEN:NAD  HEENT: right conjunctiva/sclera erythematous with cloudy discharge noted. No pain with pressure over the globe. No pain periorbital area, posterior nasopharynx erythematous with purulent discharge noted. Nasal mucosa swollen, erythematous with purulent drainage, TMs clear bilaterally, neck supple, pain with palp over left maxillary sinus  RESP: lungs clear to auscultation bilaterally, no wheezes, rales or rhonchi noted    Assessment/Plan  461.9 ACUTE SINUSITIS NOS  (primary encounter diagnosis)  Comment: otc supp care  Plan: AZITHROMYCIN 250 MG OR TABS        F/u if worsens or persists    372.00 ACUTE CONJUNCTIVITIS NOS  Comment:   Plan: MOXIFLOXACIN HCL 0.5 % OP SOLN        F/u if worsens or persists

## 2009-08-23 ENCOUNTER — Encounter (INDEPENDENT_AMBULATORY_CARE_PROVIDER_SITE_OTHER): Payer: Self-pay | Admitting: Family Medicine

## 2009-08-23 NOTE — Progress Notes (Signed)
Date Received: 08/23/2009    Request Sent to: [facility]  [facility]    Request was sent via:Fax    Contact for follow WG:NFAOZ to [phone]

## 2009-08-25 DIAGNOSIS — Z9089 Acquired absence of other organs: Secondary | ICD-10-CM | POA: Insufficient documentation

## 2009-08-28 ENCOUNTER — Ambulatory Visit (INDEPENDENT_AMBULATORY_CARE_PROVIDER_SITE_OTHER): Payer: BLUE CROSS/BLUE SHIELD | Admitting: Internal Medicine

## 2009-08-28 ENCOUNTER — Encounter (INDEPENDENT_AMBULATORY_CARE_PROVIDER_SITE_OTHER): Payer: Self-pay | Admitting: Internal Medicine

## 2009-08-28 VITALS — BP 148/86 | HR 68 | Temp 97.6°F | Resp 20 | Wt 260.0 lb

## 2009-08-28 DIAGNOSIS — G2581 Restless legs syndrome: Secondary | ICD-10-CM

## 2009-08-28 DIAGNOSIS — G4730 Sleep apnea, unspecified: Secondary | ICD-10-CM

## 2009-08-28 MED ORDER — GABAPENTIN 300 MG OR CAPS
ORAL_CAPSULE | ORAL | Status: DC
Start: 2009-08-28 — End: 2009-10-23

## 2009-08-28 MED ORDER — SERTRALINE HCL 100 MG OR TABS
ORAL_TABLET | ORAL | Status: DC
Start: 2009-08-28 — End: 2009-10-25

## 2009-08-28 NOTE — Progress Notes (Signed)
Anthony Andrade is a 44 year old male. Chief Complaint   Patient presents with   . Follow-Up      Sleep study         History of Present Illness: He is feeling better with his CPAP, feels his sleep is more restful. He notes he has RLS symptpoms which are disturbing both his sleep, and his wife's sleep. He notes his wife is awaking him because of his RLS symptoms, which he feels is making him less rested. He reports he does not leave his bed to act out dreams. He requests RLS med. He has depression/anxiety. He is on zoloft 100 mg, was on 200 mg. He requests refill.      Past Surgical History   Procedure Date   . Removal gallbladder    . Shoulder surg proc unlisted      left shoulder   . Upper gi endoscopy,diagnosis 2008     on prevacid   . Colonoscopy 2008     normal per patient         Patient Active Problem List   Diagnoses Code   . DEPRESSIVE DISORDER NEC 311   . ALLERGIC RHINITIS NOS 477.9   . 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 Gabapentin (NEURONTIN) 300 MG OR CAPS Take 1 capsule by mouth at bed time   . Indomethacin CR (INDOCIN SR) 75 MG OR CPCR 1 CAPSULE DAILY   . Lansoprazole (PREVACID OR) one tab daily   . Sertraline HCl (ZOLOFT) 100 MG OR TABS Take 1 tablet by mouth every night for mood         No family history on file.    History   Substance Use Topics   . Tobacco Use: Never   . Alcohol Use: Not on file       Allergies:Morphine and Penicillins      ROS:  Const: Improved fatique  COR: No HTN    Exam:    Gen: healthy, alert, no distress  BP 148/86  Pulse 68  Temp 97.6 F (36.4 C)  Resp 20  Wt 260 lb (117.935 kg)  DOWN LOAD: 9 hrs AHI 1.2 90 % pressure 14    Exam otherwise deferred.    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 He is on APAP, with 90 % pressure. He feels he might need a slight increase in pressure  Plan: Set at 15. Advised follow-up, with  BP recheck in 2-4 weeks, sooner as needed    333.94 RESTLESS LEGS SYNDROME  Comment: Discussed. He does not have a clear history of RBD. 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 He feels he id doing well on SSRI, and would like to try a lower dose. Advised I would not want his dose lowered if he will worsen depression/anxiety. He would like to cut his dose in half, and will call promptly if any symptom regression  Plan:Discussed neurontin vs mirapex/requip for RLS. Try neurontin. Advised not to drive if drowsy    ADVISED follow-up in 2-4 weeks, sooner as needed. He understands and agrees with this plan      Face to face consultation regarding HPI, exam, review of differential diagnoses, rationale for decision-making and use of medications including answering all questions and concerns, lasting 25 min

## 2009-08-30 ENCOUNTER — Ambulatory Visit (INDEPENDENT_AMBULATORY_CARE_PROVIDER_SITE_OTHER): Payer: BLUE CROSS/BLUE SHIELD | Admitting: Family Medicine

## 2009-08-30 ENCOUNTER — Encounter (INDEPENDENT_AMBULATORY_CARE_PROVIDER_SITE_OTHER): Payer: Self-pay | Admitting: Family Medicine

## 2009-08-30 VITALS — BP 136/84 | HR 80 | Temp 98.8°F | Resp 16 | Wt 262.0 lb

## 2009-08-30 DIAGNOSIS — H5710 Ocular pain, unspecified eye: Secondary | ICD-10-CM

## 2009-08-30 NOTE — Progress Notes (Signed)
SUBJECTIVE:   44 year old male with right eye pain, redness, constant watering,photophobia since this AM  Reports recovering from a sinus infection and conjunctivitis treated few weeks back   slight headache on the right frontal area that started 1 hour back  No rash/fever/chills/other URI symptoms  Unable to keep his eye open  .  No significant prior ophthalmological history. No change in visual acuity Does not wear contact lenses.    OBJECTIVE:   Patient appears well, vitals signs are as noted above. Eyes:  right eye  With blephrospasm,noted to be congested,lot of watering.Due to patients intolerance even after using ophth. Local anaesthetic drops  I couldn't get a good look at the pupils and anterior chamber    No periorbital cellulitis.   No rash on the face/eyelids    ASSESSMENT:   379.91 PAIN IN OR AROUND EYE  (primary encounter diagnosis)  Comment: discussed with patient and his wife that this had to be immediately be evaluated by an opthamologist  An appointment made for 3 PM today with opthamologist in [place] by my MA.patient given directions to get there  Plan: REFERRAL TO EYE CARE

## 2009-08-31 ENCOUNTER — Telehealth (HOSPITAL_BASED_OUTPATIENT_CLINIC_OR_DEPARTMENT_OTHER): Payer: Self-pay

## 2009-08-31 NOTE — Telephone Encounter (Signed)
LM for pt to CB

## 2009-08-31 NOTE — Telephone Encounter (Signed)
Urgent referral in epic/ left message for patient to call clinic to discuss problem

## 2009-09-04 NOTE — Telephone Encounter (Signed)
Per encounter from Doctors office- appt was made for local ophthalmologist in [place] on the same day

## 2009-09-11 ENCOUNTER — Encounter (INDEPENDENT_AMBULATORY_CARE_PROVIDER_SITE_OTHER): Payer: Self-pay | Admitting: Family Medicine

## 2009-09-11 NOTE — Progress Notes (Signed)
Date Received: 09/11/2009    From Whom: [facility]    Patient was notified: Notification Letter    Records are filed: Yes

## 2009-09-25 ENCOUNTER — Ambulatory Visit (INDEPENDENT_AMBULATORY_CARE_PROVIDER_SITE_OTHER): Payer: BLUE CROSS/BLUE SHIELD | Admitting: Internal Medicine

## 2009-10-02 ENCOUNTER — Ambulatory Visit (INDEPENDENT_AMBULATORY_CARE_PROVIDER_SITE_OTHER): Payer: BLUE CROSS/BLUE SHIELD | Admitting: Internal Medicine

## 2009-10-06 ENCOUNTER — Ambulatory Visit (INDEPENDENT_AMBULATORY_CARE_PROVIDER_SITE_OTHER): Payer: BLUE CROSS/BLUE SHIELD | Admitting: Internal Medicine

## 2009-10-09 ENCOUNTER — Encounter: Payer: Self-pay | Admitting: Family Medicine

## 2009-10-09 NOTE — Progress Notes (Signed)
Date request received: 10-09-09  Reason for ROI Patient transferring care  Release to Provider Office:  Company/Third Party name: [employer]  Invoiced YES   IOD Staff Member:1970

## 2009-10-11 DIAGNOSIS — H18599 Other hereditary corneal dystrophies, unspecified eye: Secondary | ICD-10-CM | POA: Insufficient documentation

## 2009-10-23 ENCOUNTER — Encounter (INDEPENDENT_AMBULATORY_CARE_PROVIDER_SITE_OTHER): Payer: Self-pay | Admitting: Internal Medicine

## 2009-10-23 ENCOUNTER — Ambulatory Visit (INDEPENDENT_AMBULATORY_CARE_PROVIDER_SITE_OTHER): Payer: BLUE CROSS/BLUE SHIELD | Admitting: Internal Medicine

## 2009-10-23 VITALS — BP 144/92 | HR 56 | Temp 97.6°F | Resp 16 | Wt 267.0 lb

## 2009-10-23 DIAGNOSIS — R030 Elevated blood-pressure reading, without diagnosis of hypertension: Secondary | ICD-10-CM

## 2009-10-23 DIAGNOSIS — G2581 Restless legs syndrome: Secondary | ICD-10-CM

## 2009-10-23 DIAGNOSIS — G4730 Sleep apnea, unspecified: Secondary | ICD-10-CM

## 2009-10-23 MED ORDER — GABAPENTIN 300 MG OR CAPS
ORAL_CAPSULE | ORAL | Status: DC
Start: 2009-10-23 — End: 2011-03-09

## 2009-10-23 NOTE — Progress Notes (Signed)
Anthony Andrade is a 44 year old male. Chief Complaint   Patient presents with   . Follow-Up      medication and sleep problem. eye results.         History of Present Illness: Follow-up on CPAP. He feels much better with new CPAP, currently on 13-17 APAP. He feels sleep is restful. His RLS is now controlled with neurontin. No further symptoms. Has borderline increase in BP. No chest pain or shortness of breath. He notes eye dryness, is seeing optho    Past Surgical History   Procedure Date   . Removal gallbladder    . Shoulder surg proc unlisted      left shoulder   . Upper gi endoscopy,diagnosis 2008     on prevacid   . Colonoscopy 2008     normal per patient         Patient Active Problem List   Diagnoses Code   . DEPRESSIVE DISORDER NEC 311   . ALLERGIC RHINITIS NOS 477.9   . 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 Gabapentin (NEURONTIN) 300 MG OR CAPS Take 1 capsule by mouth at bed time   . Indomethacin CR (INDOCIN SR) 75 MG OR CPCR 1 CAPSULE DAILY   . Lansoprazole (PREVACID OR) one tab daily   . Sertraline HCl (ZOLOFT) 100 MG OR TABS Take 1 tablet by mouth every night for mood         No family history on file.    History   Substance Use Topics   . Tobacco Use: Never   . Alcohol Use: Not on file       Allergies:Morphine and Penicillins      ROS:  CONST: Resolved fatique  ENDO: No DM  Exam:    Gen: healthy, alert, no distress  BP 144/92  Pulse 56  Temp 97.6 F (36.4 C)  Resp 16  Wt 267 lb (121.11 kg)  AHI 1.7. Great use, no leak. 90% pressure 14.1  Exam otherwise deferred.    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 He feels sleep is better  Plan: Set CPAP to 14.5    333.94 RESTLESS LEGS SYNDROME  Comment: Doing well  Plan: IRON BINDING CAPACITY/TOT IRON, FERRITIN        Check labs, refill med    BO: Advised recheck with nurse visits x2. If remains elevated, will likely need Rx. He  will increase excersise, has low salt, no EtOH    ADVISED recheck BP nurse visit over next few weeks. He understands and agrees with this plan\

## 2009-10-24 LAB — FERRITIN: Ferritin: 81 ng/mL (ref 20–230)

## 2009-10-24 LAB — IRON BINDING CAPACITY (W/IRON, TRANSFERRIN & TRANSF SAT)
Iron, SRM: 60 ug/dL (ref 40–155)
Total Iron Binding Capacity: 323 ug/dL (ref 250–460)
Transferrin Saturation: 19 % (ref 15–50)
Transferrin: 231 mg/dL (ref 180–329)

## 2009-10-24 NOTE — Progress Notes (Addendum)
Addended by: Bethena Roys on: 10/24/2009      Modules accepted: Orders

## 2009-10-25 ENCOUNTER — Telehealth (INDEPENDENT_AMBULATORY_CARE_PROVIDER_SITE_OTHER): Payer: Self-pay | Admitting: Internal Medicine

## 2009-10-25 NOTE — Telephone Encounter (Signed)
Medication Refill Documentation    Name of Medication: sertraline    Prescribing provider:  Wayland Salinas, MD    Protocol used (by medication type, not necessarily patient diagnosis): Adult:  ANTIDEPRESSANTS - OK to refill for 3 months unless last chart note specifies otherwise, patient must be seen annually.    Date last filled: 08/28/09    Date last seen for this issue:  Either 08/28/09 or 10/23/09 if discussed then.      Last 2 Encounter BP Readings:   Date BP   10/23/2009 144/92   08/30/2009 136/84           Next scheduled appointment date:  Visit date not found

## 2009-10-26 MED ORDER — SERTRALINE HCL 100 MG OR TABS
ORAL_TABLET | ORAL | Status: DC
Start: 2009-10-25 — End: 2012-07-30

## 2009-11-03 ENCOUNTER — Telehealth (INDEPENDENT_AMBULATORY_CARE_PROVIDER_SITE_OTHER): Payer: Self-pay | Admitting: Family Medicine

## 2009-11-03 MED ORDER — CLOTRIMAZOLE-BETAMETHASONE 1-0.05 % EX CREA
TOPICAL_CREAM | CUTANEOUS | Status: DC
Start: 2009-11-03 — End: 2011-03-09

## 2009-11-03 NOTE — Telephone Encounter (Signed)
Medication Refill Documentation    Name of Medication: lotrisone    Prescribing provider:  Hedy Camara, M.D.    Protocol used (by medication type, not necessarily patient diagnosis): N/A    Date last filled: 12/10    Date last seen for this issue:  12/10  Please advise      Last 2 Encounter BP Readings:   Date BP   10/23/2009 144/92   08/30/2009 136/84           Next scheduled appointment date:  Visit date not found

## 2009-11-03 NOTE — Telephone Encounter (Signed)
Needs to be seen if no improvement.

## 2009-11-04 NOTE — Telephone Encounter (Signed)
Patient notified

## 2010-02-16 ENCOUNTER — Ambulatory Visit (INDEPENDENT_AMBULATORY_CARE_PROVIDER_SITE_OTHER): Payer: BLUE CROSS/BLUE SHIELD | Admitting: Family Medicine

## 2010-02-16 VITALS — BP 128/80 | HR 88 | Temp 98.1°F | Wt 269.0 lb

## 2010-02-16 DIAGNOSIS — R1011 Right upper quadrant pain: Secondary | ICD-10-CM

## 2010-02-16 NOTE — Progress Notes (Signed)
Pt presents for evaluation of GI symptoms.  Primary symptom has been abdominal pain.    Pain location: in the RUQ.  Quality of pain has been: mild.  Abdominal pain symptoms started 5 days ago.   Dull pain and then becomes sharp intermittently  No  Nausea/vomiting  Has had a constant dull pain 1-2/10    Is pain waking patient from sleep? NO.  Is pain associated with any   particular foods? NO.  Frequent "wet", "acidy", or   "bad-tasting" belches or burps? NO.  Any dysuria,   urinary frequency or urgency? NO    Bowel habits:  stool consistency: soft.  Stool   frequency: 2 times per day.    Gross blood in stools? NO.  Mucus in stools?   NO  Stools black or tarry? NO.  Stools fatty   or greasy? NO.  Cramping or abdominal pain relieved by   bowel movement? NO.    Any associated: fever: NO                  weight loss: NO                  fatigue: yes                  change in appetite: YES: no appetite                     nausea: NO                  vomiting: NO                  Urinary symptoms:none      Any recent history of travel? NO  Any NSAIDS/antibiotics recently NO    No past medical history on file.  previous similar complaints? NO    There is no problem list on file for this patient.      No past medical history on file.  Past Surgical History   Procedure Date   . Removal gallbladder    . Shoulder surg proc unlisted      left shoulder   . Upper gi endoscopy,diagnosis 2008     on prevacid   . Colonoscopy 2008     normal per patient       PHYSICAL EXAMINATION:   BP 98/64  Pulse 72  Temp 98.3 F (36.8 C)  Wt 151 lb (68.493 kg)  LMP 01/08/2010  General: in nAD  Skin: no rash  HEENT: normocephalic, PERRL, EOMI, no ocular lesions noted, oropharynx without lesions and dentition intact  Neck: supple and no significant lymphadenopathy  Lungs: clear to auscultation bilaterally, no wheezes or crackles.  Cardiovascular: regular rate and rhythm, normal S1, S2 and no murmur  Back: no CVA tenderness  Abdomen: +bowel  sounds, soft, NT/ND, no HSM, no mass  Rectal:deferred  Extremities: warm, well-perfused, 2+ distal pulses, brisk cap refill     IMPRESSION:      ABDOMINAL PAIN RUQ  (primary encounter diagnosis)  Comment: DDX: diverticulitis vs colitis vs others  Labs today  Advised patient to go to Er prn worsening/new symptoms  F/u in clinic on Monday if unresolved  Plan: CBC, DIFF, COMPREHENSIVE METABOLIC PANEL,         VENIPUNCTURE FEE, VENIPUNCTURE FEE            Abdominal pain instructions given.    Call for onset of new or worsening symptoms including new fever,  emesis with blood or bile, unable tolerate po's due to emesis, gross   blood in stools, increased severity of pain, localization of pain to   RLQ or other new concerns.

## 2010-02-17 LAB — COMPREHENSIVE METABOLIC PANEL
ALT (GPT): 102 U/L — ABNORMAL HIGH (ref 10–64)
AST (GOT): 45 U/L — ABNORMAL HIGH (ref 15–40)
Albumin: 4 g/dL (ref 3.5–5.2)
Alkaline Phosphatase (Total): 97 U/L (ref 39–139)
Anion Gap: 10 (ref 3–11)
Bilirubin (Total): 0.6 mg/dL (ref 0.2–1.3)
Calcium: 9.5 mg/dL (ref 8.9–10.2)
Carbon Dioxide, Total: 25 mEq/L (ref 22–32)
Chloride: 103 mEq/L (ref 98–108)
Creatinine: 1.01 mg/dL (ref 0.51–1.18)
GFR, Calc, African American: >60 mL/min (ref >59)
GFR, Calc, European American: >60 mL/min (ref >59)
Glucose: 154 mg/dL — ABNORMAL HIGH (ref 62–125)
Potassium: 4 mEq/L (ref 3.7–5.2)
Protein (Total): 6.9 g/dL (ref 6.0–8.2)
Sodium: 138 mEq/L (ref 136–145)
Urea Nitrogen: 19 mg/dL (ref 8–21)

## 2010-02-17 LAB — CBC, DIFF
% Basophils: 1 % (ref 0–1)
% Eosinophils: 2 % (ref 0–7)
% Immature Granulocytes: 1 % (ref 0–1)
% Lymphocytes: 28 % (ref 19–53)
% Monocytes: 7 % (ref 5–13)
% Neutrophils: 61 % (ref 34–71)
Absolute Eosinophil Count: 0.14 10*3/uL (ref 0.00–0.50)
Absolute Lymphocyte Count: 2.15 10*3/uL (ref 1.00–4.80)
Basophils: 0.05 10*3/uL (ref 0.00–0.20)
Hematocrit: 44 % (ref 38–50)
Hemoglobin: 15.6 g/dL (ref 13.0–18.0)
Immature Granulocytes: 0.04 10*3/uL (ref 0.00–0.05)
MCH: 30.4 pg (ref 27.3–33.6)
MCHC: 35.5 g/dL (ref 32.2–36.5)
MCV: 86 fL (ref 81–98)
Monocytes: 0.57 10*3/uL (ref 0.00–0.80)
Neutrophils: 4.84 10*3/uL (ref 1.80–7.00)
Platelet Count: 181 10*3/uL (ref 150–400)
RBC: 5.14 mil/uL (ref 4.40–5.60)
RDW-CV: 13.4 % (ref 11.6–14.4)
WBC: 7.79 10*3/uL (ref 4.3–10.0)

## 2010-03-27 ENCOUNTER — Encounter (INDEPENDENT_AMBULATORY_CARE_PROVIDER_SITE_OTHER): Payer: Self-pay | Admitting: Family Medicine

## 2010-03-31 ENCOUNTER — Encounter (INDEPENDENT_AMBULATORY_CARE_PROVIDER_SITE_OTHER): Payer: Self-pay | Admitting: Family Medicine

## 2010-03-31 NOTE — Progress Notes (Signed)
Date: 03/31/2010    Disposal Method: Shredded

## 2010-04-12 ENCOUNTER — Encounter (INDEPENDENT_AMBULATORY_CARE_PROVIDER_SITE_OTHER): Payer: Self-pay | Admitting: Family Medicine

## 2010-04-12 ENCOUNTER — Ambulatory Visit (INDEPENDENT_AMBULATORY_CARE_PROVIDER_SITE_OTHER): Payer: BLUE CROSS/BLUE SHIELD | Admitting: Family Medicine

## 2010-04-12 VITALS — BP 138/86 | HR 80 | Temp 97.4°F | Resp 12 | Wt 270.0 lb

## 2010-04-12 DIAGNOSIS — J019 Acute sinusitis, unspecified: Secondary | ICD-10-CM

## 2010-04-12 MED ORDER — AZITHROMYCIN 250 MG OR TABS
ORAL_TABLET | ORAL | Status: AC
Start: 2010-04-12 — End: 2010-04-17

## 2010-04-12 NOTE — Progress Notes (Signed)
Anthony Andrade is a 44 year old  year old male who presents with a respiratory illness of 4 days duration. Symptoms include productive cough with sputum described as yellow, rhinorrhea (yellow in color), nasal congestion, facial pain (frontal in location), ear congestion bilateral, headache and sweats and the course has been one of gradually worsening.   Outpatient Prescriptions Prior to Visit   Medication Sig Dispense Refill   . Clotrimazole-Betamethasone (LOTRISONE) 1-0.05 % External Cream Apply to affected area 2 times per day until rash clears  30 g  0   . Esomeprazole Magnesium (NEXIUM OR) None Entered       . Gabapentin (NEURONTIN) 300 MG OR CAPS Take 1 capsule by mouth at bed time  90 Cap  3   . Indomethacin CR (INDOCIN SR) 75 MG OR CPCR 1 CAPSULE DAILY  30 Cap  0   . Lansoprazole (PREVACID OR) one tab daily       . Sertraline HCl (ZOLOFT) 100 MG OR TABS Take 1 tablet by mouth every night for mood  30 Tab  1     Review of patient's allergies indicates:  Allergies   Allergen Reactions   . Morphine Nausea/Vomiting   . Penicillins Rash     Past Medical History   Diagnosis Date   . ANKYLOSING SPONDYLITIS    . ESOPHAGEAL REFLUX    . ALLERGIC RHINITIS NEC    . OTHER UNSPEC SLEEP APNEA    . RESTLESS LEGS SYNDROME      Recent travel or exposure to traveler-No  Ill contacts-No  Tobacco usage-No  pets and seasonal allergies-Yes: hay fever  ROS: Constitutional: No  fever. No  wt change over 5 lbs.            CV:  No  chest pain/palpitations.            RESP: productive  Cough. No SOB.            GI: No  nausea/vomiting. No change in bowel habits.            GU: No  urinary sx. no menses problems.            INTEG:No  rash.            ALLERGY/IMMUNOL:No  allergic sx. No  recent        infections.            MUSCULOSKELETAL: No hot,swollen or tender joints.   O:  BP 138/86  Pulse 80  Temp(Src) 97.4 F (36.3 C) (Temporal)  Resp 12  Wt 270 lb (122.471 kg)  General appearance*: Color is normal, Weight appears  normal for height.  and No acute distress  Affect*: Judgement/insight normal, Mood/affect normal, Orientation oriented to time, person and place and Recent/remote Memory normal  Ears:         positive findings: R TM - amber, L TM - amber  Nose:         cloudy rhinorrhea, mucosal erythema and mucosal edema  Oropharynx:   mild erythema  Neck:         Supple, nontender, no adenopathy  Lungs:        Bilateral breath sounds clear and equal,No wheezes, rhonchi, or rales  A:   ACUTE SINUSITIS NOS  (primary encounter diagnosis)  Plan: Azithromycin (ZITHROMAX) 250 MG Oral Tab        Given  Return to clinic if symptoms worsen

## 2010-04-16 ENCOUNTER — Telehealth (INDEPENDENT_AMBULATORY_CARE_PROVIDER_SITE_OTHER): Payer: Self-pay | Admitting: Family Medicine

## 2010-04-16 MED ORDER — LEVOFLOXACIN 500 MG OR TABS
ORAL_TABLET | ORAL | Status: DC
Start: 2010-04-16 — End: 2010-09-26

## 2010-04-16 NOTE — Telephone Encounter (Signed)
There is a medication alternative for him; if not better after this, need to see him; will need pharmacy

## 2010-04-16 NOTE — Telephone Encounter (Signed)
Pt reports he saw Dr. [name] on Thu 04/12/10 for acute sinusitis, took zithromax as prescribed, today is last day. Not feeling much better. Has developed cough that's keeping him up, unable to sleep in bed with wife due to troublesome coughing. Head is still "packed solid" with congestion. Coughing has increased. Using Claritin-D, Benadryl, Tylenol, Mucinex, Robitussin and cough drops. No SOB, fast breathing or wheezing. Pt thinks he is afebrile. Routing to Dr. [name] to see if another visit is warranted at this point, or medication alternative?

## 2010-04-16 NOTE — Telephone Encounter (Signed)
Called pt; verified pharmacy as [facility]. Rx for Levaquin faxed and pt informed.

## 2010-05-31 ENCOUNTER — Encounter (INDEPENDENT_AMBULATORY_CARE_PROVIDER_SITE_OTHER): Payer: Self-pay | Admitting: Family Medicine

## 2010-08-20 DIAGNOSIS — G4730 Sleep apnea, unspecified: Secondary | ICD-10-CM

## 2010-08-20 HISTORY — DX: Sleep apnea, unspecified: G47.30

## 2010-08-24 ENCOUNTER — Encounter (INDEPENDENT_AMBULATORY_CARE_PROVIDER_SITE_OTHER): Payer: Self-pay | Admitting: Family Medicine

## 2010-09-11 ENCOUNTER — Encounter (INDEPENDENT_AMBULATORY_CARE_PROVIDER_SITE_OTHER): Payer: Self-pay | Admitting: Family Medicine

## 2010-09-11 ENCOUNTER — Telehealth (INDEPENDENT_AMBULATORY_CARE_PROVIDER_SITE_OTHER): Payer: Self-pay | Admitting: Family Medicine

## 2010-09-19 ENCOUNTER — Encounter (INDEPENDENT_AMBULATORY_CARE_PROVIDER_SITE_OTHER): Payer: Self-pay | Admitting: Family Medicine

## 2010-09-26 ENCOUNTER — Ambulatory Visit (INDEPENDENT_AMBULATORY_CARE_PROVIDER_SITE_OTHER): Payer: BLUE CROSS/BLUE SHIELD | Admitting: Family Medicine

## 2010-09-26 VITALS — BP 140/90 | HR 88 | Temp 98.2°F

## 2010-09-26 DIAGNOSIS — J209 Acute bronchitis, unspecified: Secondary | ICD-10-CM

## 2010-09-26 MED ORDER — AZITHROMYCIN 250 MG OR TABS
ORAL_TABLET | ORAL | Status: AC
Start: 2010-09-26 — End: 2010-10-01

## 2010-09-26 MED ORDER — ALBUTEROL 90 MCG/ACT IN AERS
INHALATION_SPRAY | RESPIRATORY_TRACT | Status: DC
Start: 2010-09-26 — End: 2011-03-09

## 2010-09-26 NOTE — Progress Notes (Signed)
Cc cough    hPI  Since Sat, cough, getting worse,  Taking allegra, mucinex. Lots of running nose, clogged. Some diarrhea. No blood.   Can't sleep at noc due to cough.      Hx of bad allergies.  2 yrs ago had bad bronchitis, tx with nebulizer.    Need a note stating he has been sick since Monday.    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  O NAd. Some cough. Blood pressure 140/90, pulse 88, temperature 98.2 F (36.8 C), temperature source Tympanic.  Head: Normocephalic. No masses, lesions, tenderness or abnormalities.  Eyes: conjunctivae/corneas clear. PERRL, EOM's intact.   Ears: External ears normal. Canals clear. TM's clear  Nose/Sinuses: Nares normal. Septum midline. Mucosa normal. No drainage, sinus mild tenderness.  Oropharynx: Lips, mucosa, tongue and normal. Teeth and gums normal. min post-nasal drip/drainage. mild erythema.   Neck supple without thyromegaly, no lymphadenopathy  Heart RRR without murmur, clicks, or gallops  Lungs clear to ascultation    A/p 466.0 ACUTE BRONCHITIS  (primary encounter diagnosis)  Comment: responded to albuterol also  Plan: z pak  Albuterol prn  F/u prn  Letter for work

## 2010-09-28 ENCOUNTER — Telehealth (INDEPENDENT_AMBULATORY_CARE_PROVIDER_SITE_OTHER): Payer: Self-pay | Admitting: Family Medicine

## 2010-09-28 NOTE — Telephone Encounter (Signed)
When CCR checked Dr. Elon Alas schedule and informed the pt that he is not in the office until Monday, The pt stated that to forward this message to Did[place], Deatra James, MD, his PCP.

## 2010-09-28 NOTE — Telephone Encounter (Signed)
CONFIRMED PHONE NUMBER: [phone]  CALLERS FIRST AND LAST NAME: Anthony Andrade  FACILITY NAME: NA TITLE: NA   CALLERS RELATIONSHIP:Self  RETURN CALL: General message OK    SUBJECT: General Message   REASON FOR REQUEST: Medication Request     MESSAGE: Patient is not getting better would like to ask for cough medication and advise that the "puffer" is not working  In addition the patient will return back to work on Monday, can you revise the note to state - return on the 9th.  Please follow up with patient.  Thank you.   FORWARD TO: NA

## 2010-09-28 NOTE — Telephone Encounter (Signed)
The pt stated that that this message should be sent to Dr. Franchot Heidelberg. The caller stated that a detailed VM is okay. The pt requested a call to verify the status of this request ASAP.

## 2010-09-28 NOTE — Telephone Encounter (Signed)
Patient is requesting a cough medication the inhaler isnt helping him

## 2010-09-29 MED ORDER — PROMETHAZINE-CODEINE 6.25-10 MG/5ML OR SYRP
ORAL_SOLUTION | ORAL | Status: DC
Start: 2010-09-29 — End: 2010-10-01

## 2010-09-29 NOTE — Telephone Encounter (Signed)
Patient notified via v/m.

## 2010-09-29 NOTE — Telephone Encounter (Signed)
Will try cough suppressant with codeine. Please see order.

## 2010-10-01 ENCOUNTER — Ambulatory Visit (INDEPENDENT_AMBULATORY_CARE_PROVIDER_SITE_OTHER): Payer: BLUE CROSS/BLUE SHIELD | Admitting: Internal Medicine

## 2010-10-01 VITALS — BP 168/98 | HR 92 | Temp 97.0°F

## 2010-10-01 DIAGNOSIS — J209 Acute bronchitis, unspecified: Secondary | ICD-10-CM

## 2010-10-01 MED ORDER — PREDNISONE 10 MG OR TABS
ORAL_TABLET | ORAL | Status: DC
Start: 2010-10-01 — End: 2010-10-09

## 2010-10-01 MED ORDER — PROMETHAZINE-CODEINE 6.25-10 MG/5ML OR SYRP
ORAL_SOLUTION | ORAL | Status: DC
Start: 2010-10-01 — End: 2010-10-06

## 2010-10-01 MED ORDER — CEFUROXIME AXETIL 500 MG OR TABS
ORAL_TABLET | ORAL | Status: DC
Start: 2010-10-01 — End: 2010-10-10

## 2010-10-01 NOTE — Progress Notes (Signed)
G[facility] Peers still have    Work notice   Needs Azithromycin refill   Chest cough  Mucus is green, yellow

## 2010-10-01 NOTE — Progress Notes (Signed)
CHIEF COMPLAINT:   cough    History of present illness:   Patient has cough for 2 weeks, chest tightness. He has body ache, sinus congestion. Denies fever, chills.. No sore throat. No chest pain or wheezing. Already tried z-pak, but no improvement.   history of spondylitis, was on enbrel, hold off for 2 weeks with ongoing symptoms.     Review of systems:   Please see HPI for details.    Past Medical History   Diagnosis Date   . ANKYLOSING SPONDYLITIS    . ESOPHAGEAL REFLUX    . ALLERGIC RHINITIS NEC    . OTHER UNSPEC SLEEP APNEA    . RESTLESS LEGS SYNDROME       Review of patient's allergies indicates:  Allergies   Allergen Reactions   . Morphine Nausea/Vomiting   . Penicillins Rash     Current Outpatient Prescriptions   Medication Sig   . Albuterol 90 MCG/ACT Inhalation Aero Soln 2 puffs inhaled every 4 hours as needed for wheezing and/or cough   . Azithromycin (ZITHROMAX) 250 MG Oral Tab Take 2 tablets today, then take 1 tablet every day until gone   . BuPROPion HCl (WELLBUTRIN XL) 300 MG Oral TABLET SR 24 HR 1 daily   . Clotrimazole-Betamethasone (LOTRISONE) 1-0.05 % External Cream Apply to affected area 2 times per day until rash clears   . Esomeprazole Magnesium (NEXIUM OR) None Entered   . Etanercept (ENBREL) 50 MG/ML Subcutaneous Solution once a week   . Gabapentin (NEURONTIN) 300 MG OR CAPS Take 1 capsule by mouth at bed time   . Indomethacin CR (INDOCIN SR) 75 MG OR CPCR 1 CAPSULE DAILY   . Lansoprazole (PREVACID OR) one tab daily   . Promethazine-Codeine 6.25-10 MG/5ML Oral Syrup 1 TEASPOONFUL EVERY 4 TO 6 HOURS AS NEEDED   . Sertraline HCl (ZOLOFT) 100 MG OR TABS Take 1 tablet by mouth every night for mood     History   Substance Use Topics   . Smoking status: Never Smoker    . Smokeless tobacco: Not on file   . Alcohol Use: Not      Physical Exam:  General appearance: Patient appears well, in no acute disdress. vital signs are as noted by the nurse.    HEENT: PERRLA. Ears normal.  Oropharynx normal.  Nasal mucousa normal without enlarged turbinates.  Sinuses non tender.   Neck: supple. No adenopathy in the neck.    Chest: The chest is clear, without wheezes or rales. No egophony. No chest pain with palpation.  Heart: regular beat. No murmurs/gallops/rubs.    ASSESSMENT/PLAN:  466.0 ACUTE BRONCHITIS  (primary encounter diagnosis)  Plan: Cefuroxime Axetil (CEFTIN) 500 MG Oral Tab,         PredniSONE 10 MG Oral Tab, Promethazine-Codeine        6.25-10 MG/5ML Oral Syrup        Will try 2nd round of antibiotics, also start prednisone taper. .follow up in 1 week.

## 2010-10-05 ENCOUNTER — Telehealth (INDEPENDENT_AMBULATORY_CARE_PROVIDER_SITE_OTHER): Payer: Self-pay | Admitting: Family Medicine

## 2010-10-05 NOTE — Telephone Encounter (Signed)
1 day of Prednisone left.  6  Antibiotic days left.  No fever.    Albuterol q 4 hours. States tongue is white.  Would like med for thrush.   Cough med q 6 hours with poor effect.  Home care advice for cough given.    Work note dates needs to be changed to 4/2 to 4/13.  He will pick it up.    Instructed to call back or seek tx if sxs worsen or has new concerns.    4/9/2 - ASSESSMENT/PLAN:   466.0 ACUTE BRONCHITIS (primary encounter diagnosis)   Plan: Cefuroxime Axetil (CEFTIN) 500 MG Oral Tab,   PredniSONE 10 MG Oral Tab, Promethazine-Codeine   6.25-10 MG/5ML Oral Syrup   Will try 2nd round of antibiotics, also start prednisone taper. .follow up in 1 week.

## 2010-10-05 NOTE — Telephone Encounter (Signed)
Appt made for tomorrow 11 am.

## 2010-10-05 NOTE — Telephone Encounter (Signed)
Please offer OV. Need to examine his tongue, could not be thrush; also follow up cough.

## 2010-10-06 ENCOUNTER — Ambulatory Visit (INDEPENDENT_AMBULATORY_CARE_PROVIDER_SITE_OTHER): Payer: BLUE CROSS/BLUE SHIELD | Admitting: Family Medicine

## 2010-10-06 VITALS — BP 148/98 | HR 88 | Temp 98.4°F

## 2010-10-06 DIAGNOSIS — J989 Respiratory disorder, unspecified: Secondary | ICD-10-CM

## 2010-10-06 DIAGNOSIS — J209 Acute bronchitis, unspecified: Secondary | ICD-10-CM

## 2010-10-06 MED ORDER — FLUTICASONE PROPIONATE 50 MCG/ACT NA SUSP
NASAL | Status: DC
Start: 2010-10-06 — End: 2011-03-09

## 2010-10-06 MED ORDER — MONTELUKAST SODIUM 10 MG OR TABS
ORAL_TABLET | ORAL | Status: DC
Start: 2010-10-06 — End: 2011-03-09

## 2010-10-06 MED ORDER — PROMETHAZINE-CODEINE 6.25-10 MG/5ML OR SYRP
ORAL_SOLUTION | ORAL | Status: DC
Start: 2010-10-06 — End: 2010-10-17

## 2010-10-06 NOTE — Progress Notes (Signed)
Cc persistent cough    hPI  On claritin for allergies, but Cough not better even after Finished abx, x 2.   Finished prednisone, but min helpful. Uses albuterol inh, and it does help.     No one sick at home.    occ wheeze,   noc cough, about the same degree as day time cough    Soc he works at [employer]. Has been staying home.    Patient Active Problem List   Diagnoses   . DEPRESSIVE DISORDER NEC   . ALLERGIC RHINITIS NOS   . ANKYLOSING SPONDYLITIS     O NAD Blood pressure 148/98, pulse 88, temperature 98.4 F (36.9 C), temperature source Tympanic.  Cough episodically,   O/p clear. Min PND. Neck supple. Neg LN   Lungs clear.   Heart RRR without murmur    cxr neg for infiltrates    A/p 519.9 RESP SYSTEM DISEASE NOS  (primary encounter diagnosis)  Comment: susp related to allergy bronchospasm, and already tx with abx that would cover for pertussis.  Plan: X-RAY CHEST 2 VW        rec adding singulair, cont claritin, add flonase to dec PND.     466.0 ACUTE BRONCHITIS  Comment:   Plan: Promethazine-Codeine 6.25-10 MG/5ML Oral Syrup        For symptom relief. Note given through last Fri  For work.

## 2010-10-08 LAB — X-RAY CHEST 2 VW

## 2010-10-09 ENCOUNTER — Ambulatory Visit (INDEPENDENT_AMBULATORY_CARE_PROVIDER_SITE_OTHER): Payer: BLUE CROSS/BLUE SHIELD | Admitting: Family Medicine

## 2010-10-09 VITALS — BP 138/92 | HR 76 | Temp 98.5°F | Wt 269.0 lb

## 2010-10-09 DIAGNOSIS — R059 Cough, unspecified: Secondary | ICD-10-CM

## 2010-10-09 MED ORDER — BENZONATATE 100 MG OR CAPS
ORAL_CAPSULE | ORAL | Status: AC
Start: 2010-10-09 — End: 2010-10-15

## 2010-10-09 MED ORDER — AEROCHAMBER MV MISC
Status: DC
Start: 2010-10-09 — End: 2011-03-09

## 2010-10-09 MED ORDER — PREDNISONE 10 MG OR TABS
ORAL_TABLET | ORAL | Status: DC
Start: 2010-10-09 — End: 2010-10-17

## 2010-10-09 NOTE — Progress Notes (Signed)
SUBJECTIVE:  Anthony Andrade is an 45 year old male who presents for evaluation of persistent coughing  coughing so hard that making him SOB.  Was seen on 4/4,4/9,4/14 for theses symptoms  States that after the prednisone and 2 courses of antibiotic 30% better.  No fever/chills/chest pain  Getting tired from all the coughing  Chest xray on 10/06/2010 reported normal    Denies palpitations, irregular heart beat, exertional chest pain or pressure, paroxysmal nocturnal dyspnea, orthopnea, lower extremity edema and leg pain.    Stopped enbrel 3 weeks back due to the symptoms  To start with had runny nose,sinus congestion as well    Review Of Systems  Respiratory:per HPI-nonsmoker.PPD before starting enbrel was negative.No travel outside of here  Gastrointestinal: negative  Genitourinary: negative    Past Medical History   Diagnosis Date   . ANKYLOSING SPONDYLITIS    . ESOPHAGEAL REFLUX    . ALLERGIC RHINITIS NEC    . OTHER UNSPEC SLEEP APNEA    . RESTLESS LEGS SYNDROME      Past Surgical History   Procedure Date   . Removal gallbladder    . Shoulder surg proc unlisted      left shoulder   . Upper gi endoscopy,diagnosis 2008     on prevacid   . Colonoscopy 2008     normal per patient       Outpatient Prescriptions Prior to Visit   Medication Sig Dispense Refill   . Albuterol 90 MCG/ACT Inhalation Aero Soln 2 puffs inhaled every 4 hours as needed for wheezing and/or cough  1 Inhaler  1   . BuPROPion HCl (WELLBUTRIN XL) 300 MG Oral TABLET SR 24 HR 1 daily       . Cefuroxime Axetil (CEFTIN) 500 MG Oral Tab Take 1 tablet by mouth twice daily until gone  20 Tab  0   . Clotrimazole-Betamethasone (LOTRISONE) 1-0.05 % External Cream Apply to affected area 2 times per day until rash clears  30 g  0   . Esomeprazole Magnesium (NEXIUM OR) None Entered       . Etanercept (ENBREL) 50 MG/ML Subcutaneous Solution once a week       . Fluticasone Propionate 50 MCG/ACT Nasal Suspension 2 spray per nostril qd  1 Inhaler  3   .  Gabapentin (NEURONTIN) 300 MG OR CAPS Take 1 capsule by mouth at bed time  90 Cap  3   . Indomethacin CR (INDOCIN SR) 75 MG OR CPCR 1 CAPSULE DAILY  30 Cap  0   . Lansoprazole (PREVACID OR) one tab daily       . Montelukast Sodium (SINGULAIR) 10 MG Oral Tab one po qd  30 Tab  0   . PredniSONE 10 MG Oral Tab 4 tabs daily for 2 days, 2 tabs daily for 2 days, then 1 tab daily for 3 days, stop  15 Tab  0   . Promethazine-Codeine 6.25-10 MG/5ML Oral Syrup 1 TEASPOONFUL EVERY 4 TO 6 HOURS AS NEEDED  180 mL  0   . Sertraline HCl (ZOLOFT) 100 MG OR TABS Take 1 tablet by mouth every night for mood  30 Tab  1     Review of patient's allergies indicates:  Allergies   Allergen Reactions   . Morphine Nausea/Vomiting   . Penicillins Rash       History   Substance Use Topics   . Smoking status: Never Smoker    . Smokeless tobacco: Not on file   .  Alcohol Use: Not on file       OBJECTIVE:  BP 138/92  Pulse 76  Temp(Src) 98.5 F (36.9 C) (Oral)  Wt 269 lb (122.018 kg)  SpO2 98% PF:350  After neb treatment PF at 450 and Pox 99%    General appearance: healthy, alert, no distress except coughing .  Neck: supple and no adenopathy  Lungs: clear to auscultation with exp wheezes  Heart: normal rate, regular rhythm and no murmurs, clicks, or gallops  Abdomen: soft, non-tender. BS normal. No masses or organomegaly  Pulses: radial 4/4    ASSESSMENT/PLAN:  786.2 COUGH  (primary encounter diagnosis)  Comment: concern because he was on enbrel until recently  While exam suggests a RAD will  Get labs today  Also referral to pulmonologist  Symptoms improved significantly after the neb treatment.will repeat a course of prednisone taper,continue albuterol  To Er/call 911 prn worsening/new symptoms  Plan: REFERRAL TO PULMONOLOGIST, COMPREHENSIVE         METABOLIC PANEL, CBC, DIFF, SED RATE,ESR         ONSITE, VENIPUNCTURE FEE

## 2010-10-09 NOTE — Progress Notes (Signed)
Cold symptoms. Still sick, Needs letter for work.

## 2010-10-10 ENCOUNTER — Telehealth (INDEPENDENT_AMBULATORY_CARE_PROVIDER_SITE_OTHER): Payer: Self-pay | Admitting: Family Medicine

## 2010-10-10 NOTE — Telephone Encounter (Signed)
Dr. Irine Seal did you receive this form?

## 2010-10-10 NOTE — Telephone Encounter (Signed)
CONFIRMED PHONE NUMBER: [phone]  CALLERS FIRST AND LAST NAME: Tamera Punt  FACILITY NAME: [employer] TITLE: Rep  CALLERS RELATIONSHIP:OTHER:   RETURN CALL: OK to leave detailed message with anyone that answers    SUBJECT: Letter/Form Request   REASON FOR REQUEST: Miranda called to follow up regarding the Attending Physician Statement. They request that it be responded to before 10/12/2010. Thanks.    WHAT IS THE LETTER/FORM FOR: Other: [employer]  WHO SHOULD FILL IT OUT: Diddee, Deatra James, MD   WHERE SHOULD IT BE SENT: Return contact information included with fax.  DATE NEEDED BY: 10/12/2010

## 2010-10-10 NOTE — Telephone Encounter (Signed)
When I left my office today It was not in my inbox  Please locate and place on my desk.

## 2010-10-11 ENCOUNTER — Telehealth (INDEPENDENT_AMBULATORY_CARE_PROVIDER_SITE_OTHER): Payer: Self-pay | Admitting: Family Medicine

## 2010-10-11 NOTE — Telephone Encounter (Signed)
Patient picked up Promethazine-Codeine  on 10/11/10

## 2010-10-11 NOTE — Telephone Encounter (Signed)
We only received 2 of 5 pages.  Left msg asking for form to be re-faxed.

## 2010-10-12 ENCOUNTER — Other Ambulatory Visit (INDEPENDENT_AMBULATORY_CARE_PROVIDER_SITE_OTHER): Payer: BLUE CROSS/BLUE SHIELD

## 2010-10-12 DIAGNOSIS — R059 Cough, unspecified: Secondary | ICD-10-CM

## 2010-10-12 LAB — CBC, DIFF
% Basophils: 0 % (ref 0–1)
% Eosinophils: 0 % (ref 0–7)
% Immature Granulocytes: 2 % — ABNORMAL HIGH (ref 0–1)
% Lymphocytes: 10 % — ABNORMAL LOW (ref 19–53)
% Monocytes: 6 % (ref 5–13)
% Neutrophils: 82 % — ABNORMAL HIGH (ref 34–71)
Absolute Eosinophil Count: 0 10*3/uL (ref 0.00–0.50)
Absolute Lymphocyte Count: 1.19 10*3/uL (ref 1.00–4.80)
Basophils: 0.03 10*3/uL (ref 0.00–0.20)
Hematocrit: 42 % (ref 38–50)
Hemoglobin: 14.5 g/dL (ref 13.0–18.0)
Immature Granulocytes: 0.23 10*3/uL — ABNORMAL HIGH (ref 0.00–0.05)
MCH: 30.6 pg (ref 27.3–33.6)
MCHC: 34.7 g/dL (ref 32.2–36.5)
MCV: 88 fL (ref 81–98)
Monocytes: 0.71 10*3/uL (ref 0.00–0.80)
Neutrophils: 10.18 10*3/uL — ABNORMAL HIGH (ref 1.80–7.00)
Platelet Count: 206 10*3/uL (ref 150–400)
RBC: 4.74 mil/uL (ref 4.40–5.60)
RDW-CV: 12.9 % (ref 11.6–14.4)
WBC: 12.34 10*3/uL — ABNORMAL HIGH (ref 4.3–10.0)

## 2010-10-12 LAB — COMPREHENSIVE METABOLIC PANEL
ALT (GPT): 111 U/L — ABNORMAL HIGH (ref 10–64)
AST (GOT): 52 U/L — ABNORMAL HIGH (ref 15–40)
Albumin: 3.8 g/dL (ref 3.5–5.2)
Alkaline Phosphatase (Total): 116 U/L (ref 39–139)
Anion Gap: 11 (ref 3–11)
Bilirubin (Total): 0.6 mg/dL (ref 0.2–1.3)
Calcium: 9 mg/dL (ref 8.9–10.2)
Carbon Dioxide, Total: 22 mEq/L (ref 22–32)
Chloride: 101 mEq/L (ref 98–108)
Creatinine: 1 mg/dL (ref 0.51–1.18)
GFR, Calc, African American: >60 mL/min (ref >59)
GFR, Calc, European American: >60 mL/min (ref >59)
Glucose: 313 mg/dL — ABNORMAL HIGH (ref 62–125)
Potassium: 4.1 mEq/L (ref 3.7–5.2)
Protein (Total): 7.1 g/dL (ref 6.0–8.2)
Sodium: 134 mEq/L — ABNORMAL LOW (ref 136–145)
Urea Nitrogen: 14 mg/dL (ref 8–21)

## 2010-10-15 ENCOUNTER — Telehealth (INDEPENDENT_AMBULATORY_CARE_PROVIDER_SITE_OTHER): Payer: Self-pay | Admitting: Family Medicine

## 2010-10-15 NOTE — Telephone Encounter (Signed)
Called and left message asking patient to call office back     CCR-When patient calls back please schedule an appointment with Dr. Irine Seal this week to follow up and discuss lab results

## 2010-10-15 NOTE — Telephone Encounter (Signed)
Please advise patient to come in to see me  Abnormal blood results noted-high glucose.  Should see me within this week

## 2010-10-16 ENCOUNTER — Telehealth (INDEPENDENT_AMBULATORY_CARE_PROVIDER_SITE_OTHER): Payer: Self-pay | Admitting: Family Medicine

## 2010-10-16 NOTE — Telephone Encounter (Signed)
please call and check on patient to see how he is doing?  Recd paper work from [employer] and would like to know date of return to work

## 2010-10-16 NOTE — Telephone Encounter (Signed)
Left message for pt to call to relay message below.  Tried mobile as well, person that answered said it was the wrong number

## 2010-10-17 ENCOUNTER — Encounter (INDEPENDENT_AMBULATORY_CARE_PROVIDER_SITE_OTHER): Payer: Self-pay | Admitting: Family Medicine

## 2010-10-17 ENCOUNTER — Telehealth (INDEPENDENT_AMBULATORY_CARE_PROVIDER_SITE_OTHER): Payer: Self-pay | Admitting: Family Medicine

## 2010-10-17 ENCOUNTER — Ambulatory Visit (INDEPENDENT_AMBULATORY_CARE_PROVIDER_SITE_OTHER): Payer: BLUE CROSS/BLUE SHIELD | Admitting: Family Medicine

## 2010-10-17 VITALS — BP 143/84 | HR 117 | Temp 99.0°F | Resp 18

## 2010-10-17 DIAGNOSIS — R0602 Shortness of breath: Secondary | ICD-10-CM

## 2010-10-17 DIAGNOSIS — R059 Cough, unspecified: Secondary | ICD-10-CM

## 2010-10-17 LAB — COMPREHENSIVE METABOLIC PANEL
ALT (GPT): 140 U/L — ABNORMAL HIGH (ref 10–64)
AST (GOT): 88 U/L — ABNORMAL HIGH (ref 15–40)
Albumin: 3.7 g/dL (ref 3.5–5.2)
Alkaline Phosphatase (Total): 105 U/L (ref 39–139)
Anion Gap: 11 (ref 3–11)
Bilirubin (Total): 0.8 mg/dL (ref 0.2–1.3)
Calcium: 9.4 mg/dL (ref 8.9–10.2)
Carbon Dioxide, Total: 25 mEq/L (ref 22–32)
Chloride: 99 mEq/L (ref 98–108)
Creatinine: 1.15 mg/dL (ref 0.51–1.18)
GFR, Calc, African American: >60 mL/min (ref >59)
GFR, Calc, European American: >60 mL/min (ref >59)
Glucose: 306 mg/dL — ABNORMAL HIGH (ref 62–125)
Potassium: 3.6 mEq/L — ABNORMAL LOW (ref 3.7–5.2)
Protein (Total): 6.9 g/dL (ref 6.0–8.2)
Sodium: 135 mEq/L — ABNORMAL LOW (ref 136–145)
Urea Nitrogen: 17 mg/dL (ref 8–21)

## 2010-10-17 LAB — CBC, DIFF
% Basophils: 0 % (ref 0–1)
% Eosinophils: 2 % (ref 0–7)
% Immature Granulocytes: 2 % — ABNORMAL HIGH (ref 0–1)
% Lymphocytes: 20 % (ref 19–53)
% Monocytes: 7 % (ref 5–13)
% Neutrophils: 69 % (ref 34–71)
Absolute Eosinophil Count: 0.27 10*3/uL (ref 0.00–0.50)
Absolute Lymphocyte Count: 3.11 10*3/uL (ref 1.00–4.80)
Basophils: 0.06 10*3/uL (ref 0.00–0.20)
Hematocrit: 46 % (ref 38–50)
Hemoglobin: 15.8 g/dL (ref 13.0–18.0)
Immature Granulocytes: 0.31 10*3/uL — ABNORMAL HIGH (ref 0.00–0.05)
MCH: 30.7 pg (ref 27.3–33.6)
MCHC: 34.7 g/dL (ref 32.2–36.5)
MCV: 89 fL (ref 81–98)
Monocytes: 1.08 10*3/uL — ABNORMAL HIGH (ref 0.00–0.80)
Neutrophils: 10.66 10*3/uL — ABNORMAL HIGH (ref 1.80–7.00)
Platelet Count: 205 10*3/uL (ref 150–400)
RBC: 5.14 mil/uL (ref 4.40–5.60)
RDW-CV: 12.8 % (ref 11.6–14.4)
WBC: 15.49 10*3/uL — ABNORMAL HIGH (ref 4.3–10.0)

## 2010-10-17 LAB — X-RAY CHEST 2 VW

## 2010-10-17 LAB — PR SED RATE,ESR ONSITE: Erythrocyte Sedimentation Rate: 7 mm/hr (ref 0–15)

## 2010-10-17 LAB — PR A1C RAPID, ONSITE: Hemoglobin A1C: 7.4 % — ABNORMAL HIGH (ref 4.0–6.0)

## 2010-10-17 MED ORDER — HYDROCOD POLST-CPM POLST ER 10-8 MG/5ML OR SUER
ORAL | Status: AC
Start: 2010-10-17 — End: 2010-10-22

## 2010-10-17 NOTE — Progress Notes (Signed)
SUBJECTIVE:  Anthony Andrade is an 45 year old male who presents with an illness characterized   by nonproductive cough and SOB. Symptoms began over 3 weeks ago and are   unchanged since that time.  Past history is significant for no history of pneumonia or bronchitis   Has been seen on 4/4,4/9,4/14 and 4/17 for these complaints  The main complaint is a constant cough and some SOB.  Was treated with azithromycin and oral prednisone  Steroid inhalor and albuterol  None of this has helped  resolve these symptoms ,some improvement maybe 30%.  No chest pain/dizziness/palpitation.  Some sweating since he started prednisone  Finished prednisone  2 days back.  No fever.  Has an appt with pulmonolgist on 10/30/2010.  No leg swelling/calf pain.  No orthopnea  CBC showed mild elevation of white counts on 4/17.  Blood sugars were elevated on 10/09/2010.  No abdominal pain/nausea/vomiting/headache    Has had a chest xray  On 10/06/2010-normal    Codeine helps with the cough a little  He states that the most bothersome symptom is persistent cough.    Was on enbrel till 3 weeks back.  ESR on 10/09/2010 was 7      Outpatient Prescriptions Prior to Visit   Medication Sig Dispense Refill   . Albuterol 90 MCG/ACT Inhalation Aero Soln 2 puffs inhaled every 4 hours as needed for wheezing and/or cough  1 Inhaler  1   . BuPROPion HCl (WELLBUTRIN XL) 300 MG Oral TABLET SR 24 HR 1 daily       . Clotrimazole-Betamethasone (LOTRISONE) 1-0.05 % External Cream Apply to affected area 2 times per day until rash clears  30 g  0   . Esomeprazole Magnesium (NEXIUM OR) None Entered       . Etanercept (ENBREL) 50 MG/ML Subcutaneous Solution once a week       . Fluticasone Propionate 50 MCG/ACT Nasal Suspension 2 spray per nostril qd  1 Inhaler  3   . Gabapentin (NEURONTIN) 300 MG OR CAPS Take 1 capsule by mouth at bed time  90 Cap  3   . Indomethacin CR (INDOCIN SR) 75 MG OR CPCR 1 CAPSULE DAILY  30 Cap  0   . Lansoprazole (PREVACID OR) one tab  daily       . Montelukast Sodium (SINGULAIR) 10 MG Oral Tab one po qd  30 Tab  0   . Promethazine-Codeine 6.25-10 MG/5ML Oral Syrup 1 TEASPOONFUL EVERY 4 TO 6 HOURS AS NEEDED  180 mL  0   . Sertraline HCl (ZOLOFT) 100 MG OR TABS Take 1 tablet by mouth every night for mood  30 Tab  1   . Spacer/Aero-Holding Chambers (AEROCHAMBER MV) Misc use as directed  1 Device  0     Review of patient's allergies indicates:  Allergies   Allergen Reactions   . Morphine Nausea/Vomiting   . Penicillins Rash       History   Substance Use Topics   . Smoking status: Never Smoker    . Smokeless tobacco: Not on file   . Alcohol Use: Not on file       OBJECTIVE:  BP 143/84  Pulse 117  Temp(Src) 99 F (37.2 C) (Oral)  Resp 18  SpO2 97%.pt coughing a lot  Pox 98%,at rest,pulse 96  General appearance: healthy, alert, no distress  Ears: normal TM/EAC  Pulse checked normal  Nose: normal  Oropharynx: normal  Neck: supple and no adenopathy  Lungs:  clear to auscultation  Heart: normal rate, regular rhythm and no murmurs, clicks, or gallops    ASSESSMENT:  790.99 ABNL BLOOD EXAM FINDING OTHER SPEC    Comment: will get labs drawn again today  Plan: COMPREHENSIVE METABOLIC PANEL, CBC, DIFF, A1C,         ONSITE, RAPID      786.2 COUGH(primary encounter diagnosis)  Comment:concerning given his lack of response to antibiotics/steroids/inhalors.  His being on embrelwhen all this started also concerning,he discussed this with his rheumatologists office over the phone.  Recommend he stop the advair.  Switch to tussionex.  A pulmonologist' appt was made by my MA for tomorrow 1PM at [facility]  Pt advised to go to ER prn worsening/new symptoms  Xray today normal.  Plan: , X-RAY CHEST 2 VW, VENIPUNCTURE         FEE      786.05 SHORTNESS OF BREATH  Comment: possibly from all the coughing .has an appt with pulmonologist tomorrow.  Was coughing a lot through the entire visit today  Plan: X-RAY CHEST 2 VW              3)  Recheck prn persistence, worsening,  appearance of new symptoms.

## 2010-10-17 NOTE — Telephone Encounter (Signed)
[facility] requesting xray burned to disc and mailed.  Patient referred by provider.      [facility]  [address]  [address]  [place] [address]

## 2010-10-17 NOTE — Progress Notes (Signed)
[facility] requesting xray burned to disc and mailed.  Patient referred by provider.      [facility]  [address]  [address]  [place], [place] [address]

## 2010-10-17 NOTE — Telephone Encounter (Addendum)
LM for pt to call back to relay the message below. Pt has a OV scheduled today with Dr. Irine Seal. I will close TE.

## 2010-10-18 ENCOUNTER — Encounter (INDEPENDENT_AMBULATORY_CARE_PROVIDER_SITE_OTHER): Payer: Self-pay | Admitting: Family Medicine

## 2010-10-18 ENCOUNTER — Telehealth (INDEPENDENT_AMBULATORY_CARE_PROVIDER_SITE_OTHER): Payer: Self-pay | Admitting: Family Medicine

## 2010-10-18 NOTE — Telephone Encounter (Signed)
Called pt who is scheduled to see a pulmonologist today at  1PM,and informed hi m about the elevated white counts/sugar and liver enzymes  Released results to him for pickup  followup with me in 1-2 days

## 2010-10-18 NOTE — Telephone Encounter (Signed)
CD copy: CXR 3v  10/17/10 pick up by patient.

## 2010-10-18 NOTE — Telephone Encounter (Signed)
Faxed labs and last 4 visit nots per Dr Diddee

## 2010-10-19 ENCOUNTER — Encounter (INDEPENDENT_AMBULATORY_CARE_PROVIDER_SITE_OTHER): Payer: Self-pay | Admitting: Family Medicine

## 2010-10-29 ENCOUNTER — Encounter (INDEPENDENT_AMBULATORY_CARE_PROVIDER_SITE_OTHER): Payer: Self-pay | Admitting: Family Medicine

## 2010-11-01 ENCOUNTER — Telehealth (INDEPENDENT_AMBULATORY_CARE_PROVIDER_SITE_OTHER): Payer: Self-pay | Admitting: Family Medicine

## 2010-11-01 NOTE — Telephone Encounter (Signed)
Pt wasn't sure if you filled them out.  He will be returning to work tomorrow.  He just wanted to make sure his basis were covered. He does not need you to fill them out again if they were done a few weeks back.

## 2010-11-01 NOTE — Telephone Encounter (Signed)
I had already filled one few weeks back  Is the patient back to work?  Will be better if he comes in  So forms can be filled appropriately

## 2010-11-01 NOTE — Telephone Encounter (Signed)
Patient dropped off short term disability and FMLA forms to be completed.  Forms are in your mailbox.    Thank you!

## 2010-11-01 NOTE — Telephone Encounter (Signed)
Patient is returning Dr. Diddee's phone call.  Please call back to advise.  Thank you.

## 2010-11-02 NOTE — Telephone Encounter (Signed)
noted 

## 2010-11-14 DIAGNOSIS — G2581 Restless legs syndrome: Secondary | ICD-10-CM | POA: Insufficient documentation

## 2011-01-03 ENCOUNTER — Ambulatory Visit (INDEPENDENT_AMBULATORY_CARE_PROVIDER_SITE_OTHER): Payer: BLUE CROSS/BLUE SHIELD | Admitting: Family Medicine

## 2011-01-03 VITALS — BP 152/90 | HR 88 | Temp 98.3°F | Resp 18 | Wt 264.0 lb

## 2011-01-03 DIAGNOSIS — R197 Diarrhea, unspecified: Secondary | ICD-10-CM

## 2011-01-03 NOTE — Progress Notes (Signed)
p  Anthony Andrade is an 45 year old male who presents with diarrhea.  Associated symptoms:  anorexia, nausea and vomiting        HPI:  Onset 4 days ago, unchanged since that time.     Initially only vomiting for 1 days which then stopped  Frequency:  8-10 times/ 24 hours.  Last episode: 30 minutes back.    Stools are described as watery  Blood or mucous:NO    Denies history of  constipation, melena,abdominal pain, hematochezia, fever, chills and sweats  Other household members affected: NO.     Recent travel: NO    Exposure to stream or well water:  NO  Recent dining out:NO  Recent antibiotics:NO    Review of patient's allergies indicates:  Allergies   Allergen Reactions   . Morphine Nausea/Vomiting   . Penicillins Rash     Current Outpatient Prescriptions   Medication Sig   . Albuterol 90 MCG/ACT Inhalation Aero Soln 2 puffs inhaled every 4 hours as needed for wheezing and/or cough   . BuPROPion HCl (WELLBUTRIN XL) 300 MG Oral TABLET SR 24 HR 1 daily   . Clotrimazole-Betamethasone (LOTRISONE) 1-0.05 % External Cream Apply to affected area 2 times per day until rash clears   . Esomeprazole Magnesium (NEXIUM OR) None Entered   . Etanercept (ENBREL) 50 MG/ML Subcutaneous Solution once a week   . Fluticasone Propionate 50 MCG/ACT Nasal Suspension 2 spray per nostril qd   . Gabapentin (NEURONTIN) 300 MG OR CAPS Take 1 capsule by mouth at bed time   . Indomethacin CR (INDOCIN SR) 75 MG OR CPCR 1 CAPSULE DAILY   . Lansoprazole (PREVACID OR) one tab daily   . Montelukast Sodium (SINGULAIR) 10 MG Oral Tab one po qd   . Sertraline HCl (ZOLOFT) 100 MG OR TABS Take 1 tablet by mouth every night for mood   . Spacer/Aero-Holding Chambers (AEROCHAMBER MV) Misc use as directed       ROS:   Respiratory: negative  Cardiovascular: negative  Gastrointestinal: diarrhea  Genitourinary: negative    EXAM:  BP 152/90  Pulse 88  Temp(Src) 98.3 F (36.8 C) (Temporal)  Resp 18  Wt 264 lb (119.75 kg)  General appearance: healthy,  alert, no distress  Hydration: well hydrated  HEENT:  Throat:  oropharynx w/o lesions, erythema, exudate; MMM and no tonsillar enlargement and Neck:  Neck supple. No adenopathy. Thyroid symmetric, normal size, without nodules  Chest:  clear to auscultation  Abdomen:        Auscultation:normal bowel sounds.        Palpation:  soft, nontender, no palpable masses, no organomegaly  Rectal:deferred    ASSESSMENT/PLAN:   787.91 DIARRHEA NOS  (primary encounter diagnosis)  Comment:likely viral gastritis  Discussed imodium  BRAT diet  F/u in 2 days if unimproved  Earlier prn worsening

## 2011-03-09 ENCOUNTER — Ambulatory Visit (INDEPENDENT_AMBULATORY_CARE_PROVIDER_SITE_OTHER): Payer: BLUE CROSS/BLUE SHIELD | Admitting: Family Medicine

## 2011-03-09 ENCOUNTER — Encounter (INDEPENDENT_AMBULATORY_CARE_PROVIDER_SITE_OTHER): Payer: Self-pay | Admitting: Family Medicine

## 2011-03-09 VITALS — BP 146/80 | HR 79 | Temp 98.5°F | Ht 70.0 in | Wt 263.0 lb

## 2011-03-09 DIAGNOSIS — R5383 Other fatigue: Secondary | ICD-10-CM

## 2011-03-09 DIAGNOSIS — J019 Acute sinusitis, unspecified: Secondary | ICD-10-CM

## 2011-03-09 LAB — THYROID STIMULATING HORMONE: Thyroid Stimulating Hormone: 7.265 u[IU]/mL — ABNORMAL HIGH (ref 0.400–5.000)

## 2011-03-09 MED ORDER — FLUTICASONE-SALMETEROL 115-21 MCG/ACT IN AERO
INHALATION_SPRAY | RESPIRATORY_TRACT | Status: DC
Start: 2011-03-09 — End: 2011-05-04

## 2011-03-09 MED ORDER — ALBUTEROL 90 MCG/ACT IN AERS
INHALATION_SPRAY | RESPIRATORY_TRACT | Status: DC
Start: 2011-03-09 — End: 2012-07-30

## 2011-03-09 MED ORDER — HYDROCOD POLST-CPM POLST ER 10-8 MG/5ML OR SUER
ORAL | Status: AC
Start: 2011-03-09 — End: 2011-03-14

## 2011-03-09 MED ORDER — AZITHROMYCIN 250 MG OR TABS
ORAL_TABLET | ORAL | Status: AC
Start: 2011-03-09 — End: 2011-03-14

## 2011-03-09 NOTE — Progress Notes (Signed)
Flu like symptoms. Cough, congestion, diarrhea. Needs a note for work 9/10-9/14.

## 2011-03-09 NOTE — Progress Notes (Signed)
Addended by: Marlowe Aschoff on: 03/09/2011 01:34 PM     Modules accepted: Orders

## 2011-03-09 NOTE — Patient Instructions (Signed)
It was a pleasure to see you in clinic today.               You can schedule an appointment to see us by calling [phone] or via eCare. If you are not yet signed up for eCare, please speak with a team member at the front desk.  eCare  enrollment will allow you to make appointments online, view test results and obtain a copy of our After Visit Summary.    If labs were ordered today the results are expected to be available via eCare 5 days later. If viewing your results on eCare, please make sure to scroll all the way to the bottom of the results to view any messages written by your provider.  If you have an active eCare account, this is where we will submit your results.  If you wish to receive the results over the phone or via letter, please call our office directly at [phone] and leave a message for your provider.    Otherwise, result letters are mailed 7-14 days after your tests are completed. If your physician needs to change your care based on your results, you will receive a phone call to notify you. If you haven't heard from him/her and it has been more than 14 days please give us a call.     Thank you for choosing [facility].

## 2011-03-09 NOTE — Progress Notes (Signed)
Anthony Andrade is a 45 year old  year old male who presents with a respiratory illness of 5 days duration. Symptoms include productive cough with sputum described as yellow, nasal congestion, facial pain (maxillary in location), ear congestion bilateral, headache, eye irritation, chills and diarrhea and the course has been one of gradually worsening. Patient states he has had ongoing fatigue since having pertussis last year  Outpatient Prescriptions Prior to Visit   Medication Sig Dispense Refill   . Albuterol 90 MCG/ACT Inhalation Aero Soln 2 puffs inhaled every 4 hours as needed for wheezing and/or cough  1 Inhaler  1   . BuPROPion HCl (WELLBUTRIN XL) 300 MG Oral TABLET SR 24 HR 1 daily       . Clotrimazole-Betamethasone (LOTRISONE) 1-0.05 % External Cream Apply to affected area 2 times per day until rash clears  30 g  0   . Esomeprazole Magnesium (NEXIUM OR) None Entered       . Etanercept (ENBREL) 50 MG/ML Subcutaneous Solution once a week       . Fluticasone Propionate 50 MCG/ACT Nasal Suspension 2 spray per nostril qd  1 Inhaler  3   . Gabapentin (NEURONTIN) 300 MG OR CAPS Take 1 capsule by mouth at bed time  90 Cap  3   . Indomethacin CR (INDOCIN SR) 75 MG OR CPCR 1 CAPSULE DAILY  30 Cap  0   . Lansoprazole (PREVACID OR) one tab daily       . Montelukast Sodium (SINGULAIR) 10 MG Oral Tab one po qd  30 Tab  0   . Sertraline HCl (ZOLOFT) 100 MG OR TABS Take 1 tablet by mouth every night for mood  30 Tab  1   . Spacer/Aero-Holding Chambers (AEROCHAMBER MV) Misc use as directed  1 Device  0     Review of patient's allergies indicates:  Allergies   Allergen Reactions   . Morphine Nausea/Vomiting   . Penicillins Rash     Past Medical History   Diagnosis Date   . Ankylosing spondylitis    . Esophageal reflux    . Allergic rhinitis due to other allergen    . Unspecified sleep apnea    . Restless legs syndrome (RLS)      Recent travel or exposure to traveler-No  Ill contacts-No  Tobacco usage-No  pets and  seasonal allergies-allergic rhinitis  ROS: Constitutional: No  fever. No  wt change over 5 lbs.            CV:  No  chest pain/palpitations.            RESP: productive  Cough. No SOB.            GI: No  nausea/vomiting. diarrhea            GU: No  urinary sx. no menses problems.            INTEG:No  rash.            ALLERGY/IMMUNOL:No  allergic sx. No  recent        infections.            MUSCULOSKELETAL: No hot,swollen or tender joints.   O:  BP 146/80  Pulse 79  Temp(Src) 98.5 F (36.9 C) (Oral)  Ht 5\' 10"  (1.778 m)  Wt 263 lb (119.296 kg)  BMI 37.74 kg/m2  General appearance*: Color is normal, Weight appears heavy  for height.  and No acute distress  Affect*: Judgement/insight normal, Mood/affect  normal, Orientation oriented to time, person and place and Recent/remote Memory normal  Ears:         positive findings: R TM - amber, L TM - amber  Nose:         cloudy secretions in nose, mucosal erythema and mucosal edema  Oropharynx:   mild erythema  Neck:         Supple, nontender, no adenopathy  Lungs:        Bilateral breath sounds clear and equal,No wheezes, rhonchi, or rales  A:461.9 Acute sinusitis, unspecified  (primary encounter diagnosis)  Plan: 1)Azithromycin 250 MG Oral Tab given  2) Albuterol 90 MCG/ACT Inhalation Aero Soln given  3) Fluticasone-Salmeterol (ADVAIR HFA) 115-21   MCG/ACT Inhalation Aerosol given  4)Hydrocod Polst-Chlorphen Polst (TUSSIONEX PENNKINETIC ER) 10-8 MG/5ML Oral Liquid CR        given    780.79 Other malaise and fatigue  Plan: THYROID STIMULATING HORMONE   ordered    Return to clinic as needed for followup

## 2011-03-11 ENCOUNTER — Telehealth (INDEPENDENT_AMBULATORY_CARE_PROVIDER_SITE_OTHER): Payer: Self-pay | Admitting: Family Medicine

## 2011-03-11 DIAGNOSIS — E039 Hypothyroidism, unspecified: Secondary | ICD-10-CM

## 2011-03-12 MED ORDER — LEVOTHYROXINE SODIUM 75 MCG OR TABS
ORAL_TABLET | ORAL | Status: DC
Start: 2011-03-12 — End: 2011-03-15

## 2011-03-12 MED ORDER — CEFUROXIME AXETIL 500 MG OR TABS
ORAL_TABLET | ORAL | Status: DC
Start: 2011-03-12 — End: 2011-03-21

## 2011-03-12 NOTE — Telephone Encounter (Addendum)
Spoke with patient and went over the lab results; started on thyroid hormone; not feeling better from infection so changed to Ceftin which was faxed in; also rewrote his letter to return to work and he will pick this up at the front desk

## 2011-03-15 ENCOUNTER — Encounter (INDEPENDENT_AMBULATORY_CARE_PROVIDER_SITE_OTHER): Payer: Self-pay | Admitting: Internal Medicine

## 2011-03-15 ENCOUNTER — Ambulatory Visit (INDEPENDENT_AMBULATORY_CARE_PROVIDER_SITE_OTHER): Payer: BLUE CROSS/BLUE SHIELD | Admitting: Internal Medicine

## 2011-03-15 VITALS — BP 144/90 | HR 90 | Temp 97.8°F | Resp 18 | Ht 70.0 in | Wt 263.0 lb

## 2011-03-15 DIAGNOSIS — E039 Hypothyroidism, unspecified: Secondary | ICD-10-CM

## 2011-03-15 DIAGNOSIS — J019 Acute sinusitis, unspecified: Secondary | ICD-10-CM

## 2011-03-15 NOTE — Progress Notes (Signed)
Cough, Congestion, Runny nose. Form for work

## 2011-03-15 NOTE — Patient Instructions (Addendum)
Please take levothyroxine 1/2 tab of 75 mcg daily    Skip tomorrow dose.     It was a pleasure to see you in clinic today.               You can schedule an appointment to see [place] by calling [phone] or via eCare. If you are not yet signed up for eCare, please speak with a team member at the front desk.  eCare  enrollment will allow you to make appointments online, view test results and obtain a copy of our After Visit Summary.    If labs were ordered today the results are expected to be available via eCare 5 days later. If viewing your results on eCare, please make sure to scroll all the way to the bottom of the results to view any messages written by your provider.  If you have an active eCare account, this is where we will submit your results.  If you wish to receive the results over the phone or via letter, please call our office directly at [phone] and leave a message for your provider.    Otherwise, result letters are mailed 7-14 days after your tests are completed. If your physician needs to change your care based on your results, you will receive a phone call to notify you. If you haven't heard from him/her and it has been more than 14 days please give [place] a call.     Thank you for choosing [facility].

## 2011-03-15 NOTE — Progress Notes (Signed)
CHIEF COMPLAINT:   follow up sinus infection and hypothyroidism      History of present illness:   Patient has recent sinus infection, tried z-pak, did not work; later on was given ceftin on 9/18. His sinus congestion is improving; less discharge.   He started to take levothyroxine same time , but he feels tired and fatigue, jittery, shaky since then; also insomnia.   Denies fever, chills.     Review of systems:   Please see HPI for details.    Past Medical History   Diagnosis Date   . Ankylosing spondylitis    . Esophageal reflux    . Allergic rhinitis due to other allergen    . Unspecified sleep apnea    . Restless legs syndrome (RLS)       Review of patient's allergies indicates:  Allergies   Allergen Reactions   . Morphine Nausea/Vomiting   . Penicillins Rash     Current Outpatient Prescriptions   Medication Sig   . Albuterol 90 MCG/ACT Inhalation Aero Soln 2 puffs inhaled every 4 hours as needed for wheezing and/or cough   . BuPROPion HCl (WELLBUTRIN XL) 300 MG Oral TABLET SR 24 HR 1 daily   . Cefuroxime Axetil (CEFTIN) 500 MG Oral Tab Take 1 tablet by mouth twice daily until gone   . Esomeprazole Magnesium (NEXIUM OR) None Entered   . Etanercept (ENBREL) 50 MG/ML Subcutaneous Solution once a week   . Fluticasone-Salmeterol (ADVAIR HFA) 115-21 MCG/ACT Inhalation Aerosol 1 puff twice daily x 2 weeks   . Indomethacin CR (INDOCIN SR) 75 MG OR CPCR 1 CAPSULE DAILY   . Levothyroxine Sodium (SYNTHROID) 75 MCG Oral Tab Take 1 tablet by mouth every day for thyroid replacement   . Sertraline HCl (ZOLOFT) 100 MG OR TABS Take 1 tablet by mouth every night for mood     History   Substance Use Topics   . Smoking status: Never Smoker    . Smokeless tobacco: Not on file   . Alcohol Use: Not on file      Physical Exam:  General appearance: Patient appears well, in no acute disdress. vital signs are as noted by the nurse.    HEENT: PERRLA. Ears normal.  Oropharynx normal. Nasal mucousa normal without enlarged turbinates.   Sinuses non tender.   Neck: supple. No adenopathy in the neck.    Chest: The chest is clear, without wheezes or rales.   Heart: regular beat. No murmurs/gallops/rubs.  Fine tremor seen in both hands.     ASSESSMENT/PLAN:  461.9 Acute sinusitis, unspecified  (primary encounter diagnosis)  Comment: improving  Plan: will continue ceftin. Try warm compress and sinus irrigation.. follow up as needed     244.9 Unspecified hypothyroidism  Comment: now having jittery and shakiness, likely from levothyroxine.   Plan: Levothyroxine Sodium (SYNTHROID) 75 MCG Oral         Tab        Will try reduce levothyroxine dose to 37.5 mcg. follow up in 6 weeks with lab work.   And monitor for symptoms.     FMLA paper is completed for patient today.   More than 50% of 15 minutes was spent in face to face counseling regarding issues noted above and coordinating care.

## 2011-03-18 ENCOUNTER — Ambulatory Visit (INDEPENDENT_AMBULATORY_CARE_PROVIDER_SITE_OTHER): Payer: BLUE CROSS/BLUE SHIELD | Admitting: Family Medicine

## 2011-03-18 ENCOUNTER — Encounter (INDEPENDENT_AMBULATORY_CARE_PROVIDER_SITE_OTHER): Payer: Self-pay | Admitting: Family Medicine

## 2011-03-18 VITALS — BP 144/90 | HR 86 | Ht 70.0 in | Wt 259.0 lb

## 2011-03-18 DIAGNOSIS — R059 Cough, unspecified: Secondary | ICD-10-CM

## 2011-03-18 DIAGNOSIS — J45902 Unspecified asthma with status asthmaticus: Secondary | ICD-10-CM

## 2011-03-18 MED ORDER — PREDNISONE 20 MG OR TABS
ORAL_TABLET | ORAL | Status: DC
Start: 2011-03-18 — End: 2012-01-13

## 2011-03-18 NOTE — Progress Notes (Signed)
Subjective:  Anthony Andrade is a 45 year old Male here with chief complaint of 1)started on 03/09/11 with a cough; treated with a Zpack; can't stop coughing today      Past Medical History   Diagnosis Date   . Ankylosing spondylitis    . Esophageal reflux    . Allergic rhinitis due to other allergen    . Unspecified sleep apnea    . Restless legs syndrome (RLS)      Past Surgical History   Procedure Date   . Removal gallbladder    . Shoulder surg proc unlisted      left shoulder   . Upper gi endoscopy,diagnosis 2008     on prevacid   . Colonoscopy 2008     normal per patient     Patient Active Problem List   Diagnoses   . DEPRESSIVE DISORDER NEC   . ALLERGIC RHINITIS NOS   . ANKYLOSING SPONDYLITIS     Current Outpatient Prescriptions   Medication Sig   . Albuterol 90 MCG/ACT Inhalation Aero Soln 2 puffs inhaled every 4 hours as needed for wheezing and/or cough   . BuPROPion HCl (WELLBUTRIN XL) 300 MG Oral TABLET SR 24 HR 1 daily   . Cefuroxime Axetil (CEFTIN) 500 MG Oral Tab Take 1 tablet by mouth twice daily until gone   . Esomeprazole Magnesium (NEXIUM OR) None Entered   . Fluticasone-Salmeterol (ADVAIR HFA) 115-21 MCG/ACT Inhalation Aerosol 1 puff twice daily x 2 weeks   . Indomethacin CR (INDOCIN SR) 75 MG OR CPCR 1 CAPSULE DAILY   . Levothyroxine Sodium (SYNTHROID) 75 MCG Oral Tab Take 1/2 tablet by mouth every day for thyroid replacement   . Sertraline HCl (ZOLOFT) 100 MG OR TABS Take 1 tablet by mouth every night for mood     History     Social History   . Marital Status: Married     Spouse Name: N/A     Number of Children: N/A   . Years of Education: N/A     Occupational History   . Not on file.     Social History Main Topics   . Smoking status: Never Smoker    . Smokeless tobacco: Not on file   . Alcohol Use: Not on file   . Drug Use: Not on file   . Sexually Active: Not on file     Other Topics Concern   . Not on file     Social History Narrative    Recently moved from [place].  Works for  [employer].  One daughter.  Sister is an Charity fundraiser.      Allergies-Morphine and Penicillins  ROS: Review of Systems:  Constitutional: Positive for not sleeping due to difficulty with coughing   Eyes: Negative    Ears, Nose, Mouth, Throat: Negative    Cardiovascular: Negative    Respiratory: As noted in HPI above   Gastrointestinal: Negative   OBJECTIVE:     BP 144/90  Pulse 86  Wt 259 lb (117.482 kg)  SpO2 98%  General appearance*: Color is normal, Weight appears heavy  for height.  and in mild distress  Affect*: Judgement/insight normal, Mood/affect normal, Orientation oriented to time, person and place and Recent/remote Memory normal  EARS/NOSE/THROAT:  Otoscopic exam:  canals are clear; TMs show normal landmarks without injection or fluid behind the membranes,  Nose:  normal nasal mucosa, septum and turbinates, Mouth:  Lips, gums, tongue and oral mucosa are normal.  Teeth appear  healthy., Pharynx:  oropharynx shows normal tonsils and adenoids without post-nasal drainage  NECK:  Inspection:  normal alignment; no masses, Thyroid:  No enlargement, masses or tenderness., Cervical nodes:  no adenopathy  CARDIAC:  Palpation:  The PMI is normal in location and size, without thrills., Auscultation:  Regular rate and rhythm.  S1 and S2 are normal.  No murmurs, gallops or rubs.  RESPIRATORY:  Inspection:  significant findings include using accessory muscles,   Auscultation:  Significant findings include decreased breath sounds initially.  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

## 2011-03-18 NOTE — Patient Instructions (Signed)
It was a pleasure to see you in clinic today.               You can schedule an appointment to see us by calling [phone] or via eCare. If you are not yet signed up for eCare, please speak with a team member at the front desk.  eCare  enrollment will allow you to make appointments online, view test results and obtain a copy of our After Visit Summary.    If labs were ordered today the results are expected to be available via eCare 5 days later. If viewing your results on eCare, please make sure to scroll all the way to the bottom of the results to view any messages written by your provider.  If you have an active eCare account, this is where we will submit your results.  If you wish to receive the results over the phone or via letter, please call our office directly at [phone] and leave a message for your provider.    Otherwise, result letters are mailed 7-14 days after your tests are completed. If your physician needs to change your care based on your results, you will receive a phone call to notify you. If you haven't heard from him/her and it has been more than 14 days please give us a call.     Thank you for choosing [facility].

## 2011-03-19 ENCOUNTER — Telehealth (INDEPENDENT_AMBULATORY_CARE_PROVIDER_SITE_OTHER): Payer: Self-pay | Admitting: Family Medicine

## 2011-03-19 ENCOUNTER — Encounter (INDEPENDENT_AMBULATORY_CARE_PROVIDER_SITE_OTHER): Payer: Self-pay | Admitting: Family Medicine

## 2011-03-19 NOTE — Telephone Encounter (Signed)
Feeling better; started steroids yesterday

## 2011-03-21 ENCOUNTER — Encounter (INDEPENDENT_AMBULATORY_CARE_PROVIDER_SITE_OTHER): Payer: Self-pay | Admitting: Family Medicine

## 2011-03-21 ENCOUNTER — Ambulatory Visit (INDEPENDENT_AMBULATORY_CARE_PROVIDER_SITE_OTHER): Payer: BLUE CROSS/BLUE SHIELD | Admitting: Family Medicine

## 2011-03-21 MED ORDER — MONTELUKAST SODIUM 10 MG OR TABS
ORAL_TABLET | ORAL | Status: DC
Start: 2011-03-21 — End: 2012-01-13

## 2011-03-21 NOTE — Patient Instructions (Signed)
It was a pleasure to see you in clinic today.               You can schedule an appointment to see us by calling [phone] or via eCare. If you are not yet signed up for eCare, please speak with a team member at the front desk.  eCare  enrollment will allow you to make appointments online, view test results and obtain a copy of our After Visit Summary.    If labs were ordered today the results are expected to be available via eCare 5 days later. If viewing your results on eCare, please make sure to scroll all the way to the bottom of the results to view any messages written by your provider.  If you have an active eCare account, this is where we will submit your results.  If you wish to receive the results over the phone or via letter, please call our office directly at [phone] and leave a message for your provider.    Otherwise, result letters are mailed 7-14 days after your tests are completed. If your physician needs to change your care based on your results, you will receive a phone call to notify you. If you haven't heard from him/her and it has been more than 14 days please give us a call.     Thank you for choosing [facility].

## 2011-03-22 ENCOUNTER — Encounter (INDEPENDENT_AMBULATORY_CARE_PROVIDER_SITE_OTHER): Payer: Self-pay | Admitting: Family Medicine

## 2011-03-22 NOTE — Progress Notes (Signed)
Subjective:  Anthony Andrade is a 45 year old Male here with chief complaint of 1)asthma follow up- still coughing intermittently but not as bad as he was on Tuesday evening; still coughs with much movement; taking all of the meds; taking Albuterol every 4-6 hours; denies fever      Past Medical History   Diagnosis Date   . Ankylosing spondylitis    . Esophageal reflux    . Allergic rhinitis due to other allergen    . Unspecified sleep apnea    . Restless legs syndrome (RLS)      Past Surgical History   Procedure Date   . Removal gallbladder    . Shoulder surg proc unlisted      left shoulder   . Upper gi endoscopy,diagnosis 2008     on prevacid   . Colonoscopy 2008     normal per patient     Patient Active Problem List   Diagnoses   . DEPRESSIVE DISORDER NEC   . ALLERGIC RHINITIS NOS   . ANKYLOSING SPONDYLITIS     Current Outpatient Prescriptions   Medication Sig   . Albuterol 90 MCG/ACT Inhalation Aero Soln 2 puffs inhaled every 4 hours as needed for wheezing and/or cough   . BuPROPion HCl (WELLBUTRIN XL) 300 MG Oral TABLET SR 24 HR 1 daily   . Esomeprazole Magnesium (NEXIUM OR) None Entered   . Fluticasone-Salmeterol (ADVAIR HFA) 115-21 MCG/ACT Inhalation Aerosol 1 puff twice daily x 2 weeks   . Indomethacin CR (INDOCIN SR) 75 MG OR CPCR 1 CAPSULE DAILY   . Levothyroxine Sodium (SYNTHROID) 75 MCG Oral Tab Take 1/2 tablet by mouth every day for thyroid replacement   . Montelukast Sodium (SINGULAIR) 10 MG Oral Tab 1 tablet daily   . PredniSONE 20 MG Oral Tab 3 tablets daily x 3 days then 2 tablets daily x 3 days then 1 tablet daily x 3 weeks then 1/2 tablet daily x 4 days   . Sertraline HCl (ZOLOFT) 100 MG OR TABS Take 1 tablet by mouth every night for mood     History     Social History   . Marital Status: Married     Spouse Name: N/A     Number of Children: N/A   . Years of Education: N/A     Occupational History   . Not on file.     Social History Main Topics   . Smoking status: Never Smoker    . Smokeless  tobacco: Not on file   . Alcohol Use: Not on file   . Drug Use: Not on file   . Sexually Active: Not on file     Other Topics Concern   . Not on file     Social History Narrative    Recently moved from [place].  Works for [employer].  One daughter.  Sister is an Charity fundraiser.      Allergies-Morphine and Penicillins  ROS: Review of Systems:  Constitutional: As noted in HPI above   Eyes: Negative    Ears, Nose, Mouth, Throat: Negative    Cardiovascular: Negative    Respiratory: As noted in HPI above   Gastrointestinal: Negative   Genitourinary: Negative   Musculoskeletal: Positive for sore chest muscles.   OBJECTIVE:     BP 132/94  Pulse 86  Temp(Src) 98.9 F (37.2 C) (Oral)  Wt 262 lb (118.842 kg)  SpO2 96%  PF 370 L/min  General appearance*: Color is normal, Weight appears normal  for height.  and No acute distress  Affect*: Judgement/insight normal, Mood/affect normal, Orientation oriented to time, person and place and Recent/remote Memory normal  CARDIAC:  Palpation:  The PMI is normal in location and size, without thrills., Auscultation:  Regular rate and rhythm.  S1 and S2 are normal.  No murmurs, gallops or rubs.  RESPIRATORY:  Inspection:  normal respiratory effort and chest wall movement with respiration, Palpation:  Normal.,  Auscultation:  Clear lung fields without crackles, wheezing or expiratory prolongation  No change with albuterol treatment in peak flow  IMPRESSION:  493.90 Unspecified asthma  (primary encounter diagnosis)  Plan:1) Montelukast Sodium (SINGULAIR) 10 MG Oral Tab ordered  2)REFERRAL TO ALLERGY/IMMUNOLOGY ordered  3) AIRWAY  INHALATION TREATMENT, ALBUTEROL NON-COMP UNIT   Done x 1  Recheck 2 days, sooner as needed.

## 2011-03-23 ENCOUNTER — Ambulatory Visit (INDEPENDENT_AMBULATORY_CARE_PROVIDER_SITE_OTHER): Payer: BLUE CROSS/BLUE SHIELD | Admitting: Family Medicine

## 2011-03-25 ENCOUNTER — Encounter (INDEPENDENT_AMBULATORY_CARE_PROVIDER_SITE_OTHER): Payer: Self-pay | Admitting: Family Medicine

## 2011-03-25 ENCOUNTER — Telehealth (INDEPENDENT_AMBULATORY_CARE_PROVIDER_SITE_OTHER): Payer: Self-pay | Admitting: Family Medicine

## 2011-03-25 NOTE — Telephone Encounter (Signed)
Felt better on Saturday so did not come in; feeling worse today; will call allergy doctors to get in

## 2011-04-08 ENCOUNTER — Ambulatory Visit (INDEPENDENT_AMBULATORY_CARE_PROVIDER_SITE_OTHER): Payer: BLUE CROSS/BLUE SHIELD | Admitting: Family Medicine

## 2011-04-08 ENCOUNTER — Encounter (INDEPENDENT_AMBULATORY_CARE_PROVIDER_SITE_OTHER): Payer: Self-pay | Admitting: Family Medicine

## 2011-04-08 VITALS — BP 150/96 | HR 100 | Temp 98.0°F | Resp 18 | Ht 70.0 in | Wt 267.0 lb

## 2011-04-08 DIAGNOSIS — R5383 Other fatigue: Secondary | ICD-10-CM

## 2011-04-08 DIAGNOSIS — E039 Hypothyroidism, unspecified: Secondary | ICD-10-CM

## 2011-04-08 LAB — COMPREHENSIVE METABOLIC PANEL
ALT (GPT): 69 U/L — ABNORMAL HIGH (ref 10–64)
AST (GOT): 38 U/L (ref 15–40)
Albumin: 3.7 g/dL (ref 3.5–5.2)
Alkaline Phosphatase (Total): 129 U/L (ref 39–139)
Anion Gap: 10 (ref 3–11)
Bilirubin (Total): 0.3 mg/dL (ref 0.2–1.3)
Calcium: 9.3 mg/dL (ref 8.9–10.2)
Carbon Dioxide, Total: 28 mEq/L (ref 22–32)
Chloride: 100 mEq/L (ref 98–108)
Creatinine: 0.99 mg/dL (ref 0.51–1.18)
GFR, Calc, African American: >60 mL/min (ref >59)
GFR, Calc, European American: >60 mL/min (ref >59)
Glucose: 152 mg/dL — ABNORMAL HIGH (ref 62–125)
Potassium: 3.8 mEq/L (ref 3.7–5.2)
Protein (Total): 6.5 g/dL (ref 6.0–8.2)
Sodium: 138 mEq/L (ref 136–145)
Urea Nitrogen: 5 mg/dL — ABNORMAL LOW (ref 8–21)

## 2011-04-08 LAB — CBC, DIFF
% Basophils: 0 % (ref 0–1)
% Eosinophils: 3 % (ref 0–7)
% Immature Granulocytes: 1 % (ref 0–1)
% Lymphocytes: 25 % (ref 19–53)
% Monocytes: 7 % (ref 5–13)
% Neutrophils: 64 % (ref 34–71)
Absolute Eosinophil Count: 0.22 10*3/uL (ref 0.00–0.50)
Absolute Lymphocyte Count: 1.99 10*3/uL (ref 1.00–4.80)
Basophils: 0.03 10*3/uL (ref 0.00–0.20)
Hematocrit: 42 % (ref 38–50)
Hemoglobin: 14.6 g/dL (ref 13.0–18.0)
Immature Granulocytes: 0.05 10*3/uL (ref 0.00–0.05)
MCH: 30 pg (ref 27.3–33.6)
MCHC: 35.2 g/dL (ref 32.2–36.5)
MCV: 85 fL (ref 81–98)
Monocytes: 0.58 10*3/uL (ref 0.00–0.80)
Neutrophils: 5.24 10*3/uL (ref 1.80–7.00)
Platelet Count: 197 10*3/uL (ref 150–400)
RBC: 4.86 mil/uL (ref 4.40–5.60)
RDW-CV: 13.4 % (ref 11.6–14.4)
WBC: 8.11 10*3/uL (ref 4.3–10.0)

## 2011-04-08 LAB — THYROID STIMULATING HORMONE: Thyroid Stimulating Hormone: 4.074 u[IU]/mL (ref 0.400–5.000)

## 2011-04-08 LAB — C_REACTIVE PROTEIN: C_Reactive Protein: 6.1 mg/L (ref 0.0–10.0)

## 2011-04-08 LAB — MAGNESIUM: Magnesium: 2 mg/dL (ref 1.8–2.4)

## 2011-04-08 LAB — SED RATE: Erythrocyte Sedimentation Rate: 7 mm/hr (ref 0–15)

## 2011-04-08 LAB — PR MONO TEST, ONSITE: Mononucleosis Test: NEGATIVE

## 2011-04-08 NOTE — Progress Notes (Signed)
Subjective:  Anthony Andrade is a 45 year old Male here with chief complaint of 1)fatigued, profusely sweating; still coughing somewhat; 12 hours of sleep but still tired  2)still coughing but has allergy appointment next week    Past Medical History   Diagnosis Date   . Ankylosing spondylitis    . Esophageal reflux    . Allergic rhinitis due to other allergen    . Unspecified sleep apnea    . Restless legs syndrome (RLS)      Past Surgical History   Procedure Date   . Removal gallbladder    . Shoulder surg proc unlisted      left shoulder   . Upper gi endoscopy,diagnosis 2008     on prevacid   . Colonoscopy 2008     normal per patient     Patient Active Problem List   Diagnoses   . DEPRESSIVE DISORDER NEC   . ALLERGIC RHINITIS NOS   . ANKYLOSING SPONDYLITIS     Current Outpatient Prescriptions   Medication Sig   . Albuterol 90 MCG/ACT Inhalation Aero Soln 2 puffs inhaled every 4 hours as needed for wheezing and/or cough   . BuPROPion HCl (WELLBUTRIN XL) 300 MG Oral TABLET SR 24 HR 1 daily   . Esomeprazole Magnesium (NEXIUM OR) None Entered   . Fluticasone-Salmeterol (ADVAIR HFA) 115-21 MCG/ACT Inhalation Aerosol 1 puff twice daily x 2 weeks   . Indomethacin CR (INDOCIN SR) 75 MG OR CPCR 1 CAPSULE DAILY   . Levothyroxine Sodium (SYNTHROID) 75 MCG Oral Tab Take 1/2 tablet by mouth every day for thyroid replacement   . Montelukast Sodium (SINGULAIR) 10 MG Oral Tab 1 tablet daily   . PredniSONE 20 MG Oral Tab 3 tablets daily x 3 days then 2 tablets daily x 3 days then 1 tablet daily x 3 weeks then 1/2 tablet daily x 4 days   . Sertraline HCl (ZOLOFT) 100 MG OR TABS Take 1 tablet by mouth every night for mood     History     Social History   . Marital Status: Married     Spouse Name: N/A     Number of Children: N/A   . Years of Education: N/A     Occupational History   . Not on file.     Social History Main Topics   . Smoking status: Never Smoker    . Smokeless tobacco: Not on file   . Alcohol Use: Not on file   .  Drug Use: Not on file   . Sexually Active: Not on file     Other Topics Concern   . Not on file     Social History Narrative    Recently moved from [place].  Works for [employer].  One daughter.  Sister is an Charity fundraiser.      Allergies-Morphine and Penicillins  ROS: Review of Systems:  Constitutional: As noted in HPI above   Eyes: Negative    Ears, Nose, Mouth, Throat: Negative    Cardiovascular: Negative    Respiratory: As noted in HPI above   Gastrointestinal: Negative   Genitourinary: Negative   Musculoskeletal: Positive for muscle weakness throughout.   Psychiatric: Positive for getting depressed because of ongoing issues.  OBJECTIVE:     BP 150/96  Pulse 100  Temp(Src) 98 F (36.7 C) (Temporal)  Resp 18  Ht 5\' 10"  (1.778 m)  Wt 267 lb (121.11 kg)  BMI 38.31 kg/m2  General appearance*: Color is normal,  Weight appears heavy for height.  and No acute distress  Affect*: Judgement/insight normal, Mood/affect normal, Orientation oriented to time, person and place and Recent/remote Memory normal  EYES:  Lids and Conjunctiva Conjunctiva and lids are normal.,  Pupils: Pupils equal, round and reactive to light.  , Fundoscopic Exam: Discs are normal in appearance, there no vessel changes, no exudates or hemorrhages.  EARS/NOSE/THROAT:  Otoscopic exam:  canals are clear; TMs show normal landmarks without injection or fluid behind the membranes,  Nose:  normal nasal mucosa, septum and turbinates, Mouth:  Lips, gums, tongue and oral mucosa are normal.  Teeth appear healthy., Pharynx:  oropharynx shows normal tonsils and adenoids without post-nasal drainage  NECK:  Inspection:  normal alignment; no masses, Thyroid:  No enlargement, masses or tenderness., Cervical nodes:  no adenopathy  RESPIRATORY:  Inspection:  normal respiratory effort and chest wall movement with respiration, Palpation:  Normal.,  Auscultation:  Clear lung fields without crackles, wheezing or expiratory prolongation  CARDIAC:  Palpation:  The PMI is  normal in location and size, without thrills., Auscultation:  Regular rate and rhythm.  S1 and S2 are normal.  No murmurs, gallops or rubs.  ABDOMINAL/GI:  Abdominal exam:  normal in appearance with normal bowel sounds and no masses or tenderness., Liver and spleen:  no hepatosplenomegaly, Abd Wall Hernias:  no hernias present      IMPRESSION:  244.9 Unspecified hypothyroidism  Plan: THYROID STIMULATING HORMONE   ordered    780.79 Other malaise and fatigue  Plan: CBC, DIFF, MONO TEST, ONSITE, COMPREHENSIVE  METABOLIC PANEL, MAGNESIUM, CRP, HIGH SENSITIVITY, ANA REFLEX COMP PNL BTY,         RHEUMATOID FACTOR, SED RATE ordered  Return to clinic if symptoms worsen

## 2011-04-08 NOTE — Patient Instructions (Signed)
It was a pleasure to see you in clinic today.               You can schedule an appointment to see us by calling [phone] or via eCare. If you are not yet signed up for eCare, please speak with a team member at the front desk.  eCare  enrollment will allow you to make appointments online, view test results and obtain a copy of our After Visit Summary.    If labs were ordered today the results are expected to be available via eCare 5 days later. If viewing your results on eCare, please make sure to scroll all the way to the bottom of the results to view any messages written by your provider.  If you have an active eCare account, this is where we will submit your results.  If you wish to receive the results over the phone or via letter, please call our office directly at [phone] and leave a message for your provider.    Otherwise, result letters are mailed 7-14 days after your tests are completed. If your physician needs to change your care based on your results, you will receive a phone call to notify you. If you haven't heard from him/her and it has been more than 14 days please give us a call.     Thank you for choosing  [facility].

## 2011-04-09 ENCOUNTER — Encounter (INDEPENDENT_AMBULATORY_CARE_PROVIDER_SITE_OTHER): Payer: Self-pay | Admitting: Family Medicine

## 2011-04-09 LAB — ANA REFLEX COMPREHENSIVE PANEL
ANA: NEGATIVE
Ana Interpretation Comment 1: NEGATIVE
Ana Interpretation Comment 1: NEGATIVE
Ana Screen By Multiplex: NEGATIVE

## 2011-04-09 LAB — RHEUMATOID FACTOR: Rheumatoid Factor: <13 IU/mL (ref <13)

## 2011-04-10 ENCOUNTER — Encounter (INDEPENDENT_AMBULATORY_CARE_PROVIDER_SITE_OTHER): Payer: Self-pay | Admitting: Family Medicine

## 2011-04-12 ENCOUNTER — Encounter (INDEPENDENT_AMBULATORY_CARE_PROVIDER_SITE_OTHER): Payer: Self-pay | Admitting: Family Medicine

## 2011-04-18 DIAGNOSIS — J309 Allergic rhinitis, unspecified: Secondary | ICD-10-CM | POA: Insufficient documentation

## 2011-04-22 ENCOUNTER — Encounter (INDEPENDENT_AMBULATORY_CARE_PROVIDER_SITE_OTHER): Payer: Self-pay | Admitting: Family Medicine

## 2011-04-26 ENCOUNTER — Encounter (INDEPENDENT_AMBULATORY_CARE_PROVIDER_SITE_OTHER): Payer: Self-pay | Admitting: Family Medicine

## 2011-05-04 ENCOUNTER — Telehealth (INDEPENDENT_AMBULATORY_CARE_PROVIDER_SITE_OTHER): Payer: Self-pay | Admitting: Family Medicine

## 2011-05-04 DIAGNOSIS — J019 Acute sinusitis, unspecified: Secondary | ICD-10-CM

## 2011-05-04 MED ORDER — FLUTICASONE-SALMETEROL 115-21 MCG/ACT IN AERO
INHALATION_SPRAY | RESPIRATORY_TRACT | Status: DC
Start: 2011-05-04 — End: 2012-07-30

## 2011-05-04 NOTE — Telephone Encounter (Signed)
faxed

## 2011-05-04 NOTE — Telephone Encounter (Signed)
Chart states pt was to use for 2 weeks with 1 refill. Please advise.  Medication Refill Documentation    Name of Medication: Fluticasone-Salmeterol (ADVAIR HFA) 115-21 MCG/ACT Inhalation Aerosol    Prescribing provider:  Glenetta Borg MD    Protocol used (by medication type, not necessarily patient diagnosis): Adult:  ASTHMA  -  OK to refill if patient has been seen in last 6 months for asthma.  Fill amount up to their next 6 month follow-up appointment & schedule next appointment.  Note:  Do not refill if patient is using more than 1 Albuterol MDI per month, or having ED visits or hospitalizations - indicating poor control, or if patient has self-discontinued their preventive medication.    Date last filled: 03/09/11    Date last seen for this issue:  03/21/11      BP Readings from Last 2 Encounters:   04/08/11 150/96   03/21/11 132/94       Next scheduled appointment date:  Visit date not found

## 2011-05-06 ENCOUNTER — Other Ambulatory Visit (INDEPENDENT_AMBULATORY_CARE_PROVIDER_SITE_OTHER): Payer: Self-pay | Admitting: Family Medicine

## 2011-05-06 MED ORDER — LEVOTHYROXINE SODIUM 75 MCG OR TABS
ORAL_TABLET | ORAL | Status: DC
Start: 2011-05-06 — End: 2012-08-04

## 2011-05-06 NOTE — Telephone Encounter (Signed)
Medication Refill Documentation    Name of Medication: Levothyroxine    Prescribing provider:  Hedy Camara, M.D.    Protocol used (by medication type, not necessarily patient diagnosis): Adult:  THYROID REPLACEMENT - OK to refill for 12 months from last normal TSH.     THYROID STIMULATING HORMONE   Date Value Range Status   04/08/2011 4.074  0.400 - 5.000 uIU/mL Final   .    Date last filled: 03/15/11    Date last seen for this issue:  04/08/11      BP Readings from Last 2 Encounters:   04/08/11 150/96   03/21/11 132/94       Next scheduled appointment date:  Visit date not found

## 2011-07-01 ENCOUNTER — Encounter (INDEPENDENT_AMBULATORY_CARE_PROVIDER_SITE_OTHER): Payer: Self-pay | Admitting: Internal Medicine

## 2011-07-01 ENCOUNTER — Ambulatory Visit (INDEPENDENT_AMBULATORY_CARE_PROVIDER_SITE_OTHER): Payer: BLUE CROSS/BLUE SHIELD | Admitting: Internal Medicine

## 2011-07-01 VITALS — BP 142/86 | HR 88 | Temp 98.6°F | Wt 262.0 lb

## 2011-07-01 DIAGNOSIS — J45909 Unspecified asthma, uncomplicated: Secondary | ICD-10-CM

## 2011-07-01 DIAGNOSIS — J329 Chronic sinusitis, unspecified: Secondary | ICD-10-CM

## 2011-07-01 DIAGNOSIS — J209 Acute bronchitis, unspecified: Secondary | ICD-10-CM

## 2011-07-01 MED ORDER — CEFUROXIME AXETIL 500 MG OR TABS
ORAL_TABLET | ORAL | Status: DC
Start: 2011-07-01 — End: 2012-01-13

## 2011-07-01 MED ORDER — PREDNISONE 10 MG OR TABS
ORAL_TABLET | ORAL | Status: AC
Start: 2011-07-01 — End: 2011-07-15

## 2011-07-01 NOTE — Patient Instructions (Signed)
It was a pleasure to see you in clinic today.               You can schedule an appointment to see us by calling [phone] or via eCare. If you are not yet signed up for eCare, please speak with a team member at the front desk.  eCare  enrollment will allow you to make appointments online, view test results and obtain a copy of our After Visit Summary.    If labs were ordered today the results are expected to be available via eCare 5 days later. If viewing your results on eCare, please make sure to scroll all the way to the bottom of the results to view any messages written by your provider.  If you have an active eCare account, this is where we will submit your results.  If you wish to receive the results over the phone or via letter, please call our office directly at [phone] and leave a message for your provider.    Otherwise, result letters are mailed 7-14 days after your tests are completed. If your physician needs to change your care based on your results, you will receive a phone call to notify you. If you haven't heard from him/her and it has been more than 14 days please give us a call.     Thank you for choosing [facility].

## 2011-07-01 NOTE — Progress Notes (Signed)
Anthony Andrade is a 46 year old male.   Chief Complaint   Patient presents with   . URI     Congestion and flu like symptoms 5 days long       History of Present Illness: He has sinus pain, cough. Yellow sputum. He has wheezing. No chest pain. No headache, except sinus pain. No dizziness, no focal numbness or weakness. No ear ache, no sore throat. Allergic to PCN. He has inhalers, helping. He reports had a rash to PCN, but has done well 9/12 with ceftin.    Past Medical History   Diagnosis Date   . Ankylosing spondylitis    . Esophageal reflux    . Allergic rhinitis due to other allergen    . Unspecified sleep apnea    . Restless legs syndrome (RLS)        Past Surgical History   Procedure Date   . Removal gallbladder    . Shoulder surg proc unlisted      left shoulder   . Upper gi endoscopy,diagnosis 2008     on prevacid   . Colonoscopy 2008     normal per patient       Patient Active Problem List   Diagnoses   . DEPRESSIVE DISORDER NEC   . ALLERGIC RHINITIS NOS   . ANKYLOSING SPONDYLITIS       Current Outpatient Prescriptions   Medication Sig   . Albuterol 90 MCG/ACT Inhalation Aero Soln 2 puffs inhaled every 4 hours as needed for wheezing and/or cough   . BuPROPion HCl (WELLBUTRIN XL) 300 MG Oral TABLET SR 24 HR 1 daily   . Esomeprazole Magnesium (NEXIUM OR) None Entered   . Fluticasone-Salmeterol (ADVAIR HFA) 115-21 MCG/ACT Inhalation Aerosol 1 puff twice daily   . Indomethacin CR (INDOCIN SR) 75 MG OR CPCR 1 CAPSULE DAILY   . Levothyroxine Sodium (SYNTHROID) 75 MCG Oral Tab Take 1/2 tablet by mouth every day for thyroid replacement   . Levothyroxine Sodium 75 MCG Oral Tab take 1 tablet by mouth once daily for THYROID REPLACEMENT   . Montelukast Sodium (SINGULAIR) 10 MG Oral Tab 1 tablet daily   . PredniSONE 20 MG Oral Tab 3 tablets daily x 3 days then 2 tablets daily x 3 days then 1 tablet daily x 3 weeks then 1/2 tablet daily x 4 days   . Pregabalin (LYRICA) 25 MG Oral Cap 1 tablet daily   . Sertraline  HCl (ZOLOFT) 100 MG OR TABS Take 1 tablet by mouth every night for mood   . SULFADIAZINE OR 2 tablets daily, outside prescriber       No family history on file.    History   Substance Use Topics   . Smoking status: Never Smoker    . Smokeless tobacco: Not on file   . Alcohol Use: Not on file       Allergies:Morphine and Penicillins      ROS:  COR: no chest pain  ID: no fever    Exam:    Gen: healthy, alert, no distress  BP 142/86  Pulse 88  Temp(Src) 98.6 F (37 C) (Oral)  Wt 262 lb (118.842 kg)  LUNGS: The lungs are clear to auscultation-except wheezing which resolved with 1 neb.  COR: RRR S1S2  HEENT: Pupils equal reactive to light. Sclera and conjunctivae clear. EOM l. External ears and canals clear. TM's normal bilaterally. Nose normal without lesions or discharge. Gums and dentition normal. Sinus tenderness is appreciated. Oropharynx  normal with normal tongue and palate. Neck supple.  NEURO: Non-focal  Exam otherwise deferred.    IMPRESSION/PLAN:    493.90 Asthma  (primary encounter diagnosis)  Comment: Much better with neb. He has inhaler  Plan: Prednisone burst. Advised follow-up at his regular appointment, sooner if not better by later this week and promptly/ER if worse    473.9 Sinusitis  Comment: Discussed. He has no allergy to ceftin  Plan: Rx ceftin. Advised follow-up at his regular appointment, sooner if not better by later this week and promptly/ER if worse    Face to face consultation regarding HPI, exam, review of differential diagnoses, rationale for decision-making and use of medications including answering all questions and concerns, lasting 25 min

## 2011-09-24 ENCOUNTER — Ambulatory Visit (INDEPENDENT_AMBULATORY_CARE_PROVIDER_SITE_OTHER): Payer: BLUE CROSS/BLUE SHIELD | Admitting: Family Medicine

## 2011-09-25 ENCOUNTER — Ambulatory Visit (INDEPENDENT_AMBULATORY_CARE_PROVIDER_SITE_OTHER): Payer: BLUE CROSS/BLUE SHIELD | Admitting: Family Medicine

## 2011-10-14 ENCOUNTER — Encounter (INDEPENDENT_AMBULATORY_CARE_PROVIDER_SITE_OTHER): Payer: Self-pay

## 2011-10-15 ENCOUNTER — Ambulatory Visit (INDEPENDENT_AMBULATORY_CARE_PROVIDER_SITE_OTHER): Payer: BLUE CROSS/BLUE SHIELD | Admitting: Family Medicine

## 2011-10-18 ENCOUNTER — Encounter (INDEPENDENT_AMBULATORY_CARE_PROVIDER_SITE_OTHER): Payer: Self-pay | Admitting: Family Medicine

## 2011-10-23 ENCOUNTER — Ambulatory Visit (INDEPENDENT_AMBULATORY_CARE_PROVIDER_SITE_OTHER): Payer: BLUE CROSS/BLUE SHIELD | Admitting: Family Medicine

## 2012-01-13 ENCOUNTER — Encounter (INDEPENDENT_AMBULATORY_CARE_PROVIDER_SITE_OTHER): Payer: Self-pay | Admitting: Family Medicine

## 2012-01-13 ENCOUNTER — Ambulatory Visit (INDEPENDENT_AMBULATORY_CARE_PROVIDER_SITE_OTHER): Payer: BLUE CROSS/BLUE SHIELD | Admitting: Family Medicine

## 2012-01-13 VITALS — BP 149/89 | HR 78 | Temp 98.3°F | Ht 70.0 in | Wt 256.2 lb

## 2012-01-13 DIAGNOSIS — M459 Ankylosing spondylitis of unspecified sites in spine: Secondary | ICD-10-CM

## 2012-01-13 DIAGNOSIS — I10 Essential (primary) hypertension: Secondary | ICD-10-CM

## 2012-01-13 DIAGNOSIS — J309 Allergic rhinitis, unspecified: Secondary | ICD-10-CM

## 2012-01-13 MED ORDER — LOSARTAN POTASSIUM 50 MG OR TABS
ORAL_TABLET | ORAL | Status: DC
Start: 2012-01-13 — End: 2012-02-11

## 2012-01-13 MED ORDER — TIZANIDINE HCL 4 MG OR TABS
ORAL_TABLET | ORAL | Status: DC
Start: 2012-01-13 — End: 2012-09-18

## 2012-01-13 MED ORDER — HYDROCODONE-ACETAMINOPHEN 5-500 MG OR TABS
ORAL_TABLET | ORAL | Status: DC
Start: 2012-01-13 — End: 2012-01-22

## 2012-01-13 NOTE — Patient Instructions (Addendum)
It was a pleasure to see you in clinic today.               You can schedule an appointment to see [place] by calling [phone] or via eCare. If you are not yet signed up for eCare, please speak with a team member at the front desk.  eCare  enrollment will allow you to make appointments online, view test results and obtain a copy of our After Visit Summary.    If labs were ordered today the results are expected to be available via eCare 5 days later. If viewing your results on eCare, please make sure to scroll all the way to the bottom of the results to view any messages written by your provider.  If you have an active eCare account, this is where we will submit your results.  If you wish to receive the results over the phone or via letter, please call our office directly at [phone] and leave a message for your provider.    Otherwise, result letters are mailed 7-14 days after your tests are completed. If your physician needs to change your care based on your results, you will receive a phone call to notify you. If you haven't heard from him/her and it has been more than 14 days please give [place] a call.     Medication Refills: If you need to get a prescription refill, please contact your pharmacy 1 week before your current supply will run out to request the refill.  Contacting your pharmacy is the fastest and safest way to obtain a medication refill.  The pharmacy will notify our office.  Please note, that a minimum of 48 to 72 hours is needed to refill a medication.  Please call your pharmacy early to allow enough time to refill before you anticipate running out.    We know you have a choice in where you receive your healthcare and we sincerely thank you for choosing [facility].  Clinical Reference Documents    Index   Dietary Approaches to Stop Hypertension (The DASH Diet)   What is hypertension?   Hypertension is blood pressure that keeps being higher than normal. Blood pressure is the force  of blood against artery walls as the heart pumps blood through the body. Blood pressure can be unhealthy if it is above 120/80. The higher your blood pressure, the greater the health risk.   High blood pressure can be controlled if you take these steps:    Maintain a healthy weight.    Are physically active.    Follow a healthy eating plan, which includes foods that do not have a lot of salt and sodium.    Do not drink a lot of alcohol.   Diet affects high blood pressure. Following the DASH diet and reducing the amount of sodium in your diet will help lower your blood pressure. It will also help prevent high blood pressure.   What is the DASH diet?   Dietary Approaches to Stop Hypertension (DASH) is a diet that is low in saturated fat, cholesterol, and total fat. It emphasizes fruits, vegetables, and low-fat dairy foods. The DASH diet also includes whole-grain products, fish, poultry, and nuts. It encourages fewer servings of red meat, sweets, and sugar-containing beverages. It is rich in magnesium, potassium, and calcium, as well as protein and fiber.   How do I get started on the DASH diet?   The DASH diet requires no special foods and has no  hard-to-follow recipes. Start by seeing how DASH compares with your current eating habits.   The DASH eating plan illustrated below is based on a diet of 2,000 calories a day. Your healthcare provider or a dietitian can help you determine how many calories a day you need. Most adults need somewhere between 1600 and 2800 calories a day. Serving sizes for different foods vary from 1/2 cup to 1 and 1/4 cups. Check product nutrition labels for serving sizes and the number of calories per serving.                      Number of        Examples of  Food Group      servings         serving size  ----------------------------------------------------------------    Grains and      7 to 8 per day   1 slice of bread  grain products                   1 cup ready-to-eat cold cereal                                    1/2 cup cooked rice, pasta,                                   or cereal    Vegetables      4 to 5 per day   1 cup raw leafy vegetable                                   1/2 cup cooked vegetable                                   6 ounces (oz) vegetable juice      Fruits          4 to 5 per day   1 medium fruit                                   1/4 cup dried fruit                                   1/2 cup fresh, frozen, or                                   canned fruit                                   6 oz fruit juice      Low-fat or      2 to 3 per day   8 oz milk  fat-free                         1 cup yogurt  dairy foods  1 and 1/2 oz cheese    Lean meats,  poultry,        2 or fewer per   3 oz cooked lean meat,  or fish         day              skinless poultry, or fish    Nuts, seeds,    4 to 5 per week  1/3 cup or 1 and 1/2 oz nuts  and dry beans                    1 tablespoon or 1/2 oz seeds                                   1/2 cup cooked dry beans    Fats and oils   2 to 3 per day   1 teaspoon soft margarine                                   1 tablespoon low-fat mayonnaise                                   2 tablespoons light salad                                   dressing                                   1 teaspoon vegetable oil    Sweets          5 per week       1 tablespoon sugar                                   1 tablespoon jelly or jam                                   1/2 oz jelly beans                                   8 oz lemonade  ----------------------------------------------------------------    Make changes gradually. Here are some suggestions that might help:    If you now eat 1 or 2 servings of vegetables a day, add a serving at lunch and another at dinner.    If you have not been eating fruit regularly, or have only juice at breakfast, add a serving to your meals or have it as a snack.    Drink milk or water with lunch or dinner instead  of soda, sugar-sweetened tea, or alcohol. Choose low-fat (1%) or fat-free (nonfat) dairy products so that you are eating fewer calories and less saturated fat, total fat, and cholesterol.    Read food labels on margarines and salad dressings to choose products lowest in fat.    If you now eat large portions of meat, slowly cut back--by a half or  a third at each meal. Limit meat to 6 ounces a day (two 3-ounce servings). Three to 4 ounces is about the size of a deck of cards.    Have 2 or more meatless meals each week. Increase servings of vegetables, rice, pasta, and beans in all meals. Try casseroles, pasta, and stir-fry dishes, which have less meat and more vegetables, grains, and beans.    Use fruits canned in their own juice. Fresh fruits require little or no preparation. Dried fruits are a good choice to carry with you or to have ready in the car.    Try these snacks ideas: unsalted pretzels or nuts mixed with raisins, graham crackers, low-fat and fat-free yogurt or frozen yogurt, popcorn with no salt or butter added, and raw vegetables.    Choose whole-grain foods to get more nutrients, including minerals and fiber. For example, choose whole-wheat bread, whole-grain cereals, or brown rice.    Use fresh, frozen, or no-salt-added canned vegetables.   Remember to also reduce the salt and sodium in your diet. Try to have no more than 2000 milligrams (mg) of sodium per day, with a goal of further reducing the sodium to 1500 mg per day. Three important ways to reduce sodium are:    Eat food products with reduced-sodium or no salt added.    Use less salt when you prepare foods and do not add salt to your food at the table.    Read food labels. Aim for foods that contain less than 5% of the daily value of sodium.   The DASH eating plan is not designed for weight loss. But it contains many lower-calorie foods, such as fruits and vegetables. You can make it lower in calories by replacing high-calorie foods with  more fruits and vegetables. Some ideas to increase fruits and vegetables and decrease calories include:    Eat a medium apple instead of 4 shortbread cookies. You'll save 80 calories.    Eat 1/4 cup of dried apricots instead of a 2-ounce bag of pork rinds. You'll save 230 calories.    Have a hamburger that's 3 ounces instead of 6 ounces. Add a 1/2 cup serving of carrots and a 1/2 cup serving of spinach. You'll save more than 200 calories.    Instead of 5 ounces of chicken, have a stir fry with 2 ounces of chicken and 1 and 1/2 cups of raw vegetables. Use just a small amount of vegetable oil. You'll save 50 calories.    Have a 1/2 cup serving of low-fat frozen yogurt instead of a 1-and-1/2-ounce chocolate bar. You'll save about 110 calories.    Use low-fat or fat-free condiments, such as fat-free salad dressings.    Eat smaller portions. Cut back gradually.    Use food labels to compare fat and calorie content in packaged foods. Items marked low fat or fat free may be lower in fat but not lower in calories than their regular versions.    Limit foods with lots of added sugar, such as pies, flavored yogurts, candy bars, ice cream, sherbet, regular soft drinks, and fruit drinks.    Drink water or club soda instead of cola or other soda drinks.   For more information, see the National Heart, Lung, and Blood Institute Web site at: asphaltmakina.com.   Based on [employer] guidelines.   Published by [employer].   Last modified: 2007-02-02   Last reviewed: 2007-04-26   This content is reviewed periodically and is subject to change  as new health information becomes available. The information is intended to inform and educate and is not a replacement for medical evaluation, advice, diagnosis or treatment by a healthcare professional.   Cardiology Advisor 2009.3 Index  Cardiology Advisor 2009.3 Credits    2009 [employer] and/or its affiliates. All Rights  Reserved.

## 2012-01-13 NOTE — Progress Notes (Signed)
Subjective:  Anthony Andrade is a 46 year old Male here with chief complaint of 1)right ear discomfort ongoing for almost 1 month; denies any other symptoms such as nasal congestion  2)blood pressure was 154/90-95- feels pulsing in his eye; changed diet and walks 5 miles daily  3) seeing new rheumatologist (Dr. Diona Browner) at [facility]; has had Flexeril in the past;wants Vicodin to be refilled since he is in significant pain; states he fired his last rheumatologist    Past Medical History   Diagnosis Date   . Ankylosing spondylitis    . Esophageal reflux    . Allergic rhinitis due to other allergen    . Unspecified sleep apnea    . Restless legs syndrome (RLS)      Past Surgical History   Procedure Date   . Removal gallbladder    . Shoulder surg proc unlisted      left shoulder   . Upper gi endoscopy,diagnosis 2008     on prevacid   . Colonoscopy 2008     normal per patient     Patient Active Problem List   Diagnosis   . DEPRESSIVE DISORDER NEC   . ALLERGIC RHINITIS NOS   . ANKYLOSING SPONDYLITIS     Current Outpatient Prescriptions   Medication Sig   . Albuterol 90 MCG/ACT Inhalation Aero Soln 2 puffs inhaled every 4 hours as needed for wheezing and/or cough   . BuPROPion HCl (WELLBUTRIN XL) 300 MG Oral TABLET SR 24 HR 1 daily   . Esomeprazole Magnesium (NEXIUM OR) None Entered   . Fexofenadine HCl (ALLEGRA OR) None Entered   . Fluticasone-Salmeterol (ADVAIR HFA) 115-21 MCG/ACT Inhalation Aerosol 1 puff twice daily   . Indomethacin CR (INDOCIN SR) 75 MG OR CPCR 1 CAPSULE DAILY   . InFLIXimab (REMICADE) 100 MG Intravenous Recon Soln 600 mg once every 6 weeks   . Levothyroxine Sodium 75 MCG Oral Tab take 1 tablet by mouth once daily for THYROID REPLACEMENT   . Lorazepam 2 MG Oral Tab 1 TABLET TWICE DAILY   . Pregabalin (LYRICA) 25 MG Oral Cap 1 tablet daily   . Sertraline HCl (ZOLOFT) 100 MG OR TABS Take 1 tablet by mouth every night for mood     History     Social History   . Marital Status:  Married     Spouse Name: N/A     Number of Children: N/A   . Years of Education: N/A     Occupational History   . Not on file.     Social History Main Topics   . Smoking status: Never Smoker    . Smokeless tobacco: Not on file   . Alcohol Use: Not on file   . Drug Use: Not on file   . Sexually Active: Not on file     Other Topics Concern   . Not on file     Social History Narrative    Recently moved from [place].  Works for [employer].  One daughter.  Sister is an Charity fundraiser.      Allergies-Morphine and Penicillins  ROS: Review of Systems:  Constitutional: As noted in HPI above  Ears, Nose, Mouth, Throat: As noted in HPI above   Cardiovascular: Negative    Respiratory: Negative    Gastrointestinal: Negative   Genitourinary: Negative   Musculoskeletal: As noted in HPI above   Neurological: Negative   OBJECTIVE:     BP 149/89  Pulse 78  Temp 98.3  F (36.8 C) (Temporal)  Ht 5\' 10"  (1.778 m)  Wt 256 lb 3.2 oz (116.212 kg)  BMI 36.76 kg/m2  SpO2 96%  General appearance*: Color is normal, Weight appears heavy  for height.  and No acute distress  Affect*: Judgement/insight normal, Mood/affect normal, Orientation oriented to time, person and place and Recent/remote Memory normal  EARS/NOSE/THROAT:  Otoscopic exam:  significant findings include both TM's dull but not erythematous,  Nose:  significant findings include mildly edematous turbinates bilaterally, Mouth:  Lips, gums, tongue and oral mucosa are normal.  Teeth appear healthy., Pharynx:  oropharynx shows normal tonsils and adenoids without post-nasal drainage  NECK:  Inspection:  normal alignment; no masses, Thyroid:  No enlargement, masses or tenderness., Cervical nodes:  no adenopathy  RESPIRATORY:  Inspection:  normal respiratory effort and chest wall movement with respiration, Palpation:  Normal.,  Auscultation:  Clear lung fields without crackles, wheezing or expiratory prolongation  CARDIAC:  Palpation:  The PMI is normal in location and size, without  thrills., Auscultation:  Regular rate and rhythym.  S1 and S2 are normal.  No murmurs, gallops or rubs.      IMPRESSION:  401.9 Hypertension  (primary encounter diagnosis)  Plan: started on losartan; patient did not apparently tolerate lisinopril in the past due to cough  Return to clinic in 2 weeks for followup    477.9 ALLERGIC RHINITIS NOS  Plan: given Flonase to help decrease eustachian tube dysfunction; patient on hypertensive medication so cannot take Sudafed    720.0 Ankylosing spondylitis  Plan:1) InFLIXimab (REMICADE) 100 MG Intravenous Recon Soln given by rheumatologist  2) Lorazepam 2 MG Oral Tab given by rheumatologist  3) TiZANidine HCl 4 MG Oral Tab started instead of the Flexeril (patient did not get much relief from the Flexeril)  4) HYDROCODONE-APAP (VICODIN) 5-500  MG Oral Tab  Given a few tablets until he can see the rheumatologist  Return to clinic in 2 weeks for followup of blood pressure    Long discussion regarding his persistent pain and difficulties with physicians giving him medication  Discussion of etiology of hypertension and ways to control this besides medications    Total time of approximately 60 minutes was spent face-to-face with the patient, of which more than 50% was spent counseling and coordinating the patient's care as outlined in this note.

## 2012-01-14 ENCOUNTER — Telehealth (INDEPENDENT_AMBULATORY_CARE_PROVIDER_SITE_OTHER): Payer: Self-pay | Admitting: Family Medicine

## 2012-01-14 ENCOUNTER — Encounter (INDEPENDENT_AMBULATORY_CARE_PROVIDER_SITE_OTHER): Payer: Self-pay | Admitting: Family Medicine

## 2012-01-14 NOTE — Telephone Encounter (Signed)
Letter written. Forward to MA team to print and place up front for pick up.     Pt not notified as message from CCR states: Not OK to leave message. Forward to tomorrow to try again.

## 2012-01-14 NOTE — Telephone Encounter (Signed)
CONFIRMED PHONE NUMBER:   Telephone Information:   Home Phone [phone]   Work Phone [phone]   Mobile [phone]       CALLERS FIRST AND LAST NAME: Anthony Andrade    FACILITY NAME: na TITLE: na  CALLERS RELATIONSHIP:Self  RETURN CALL: Not OK to leave any message    SUBJECT: General Message   REASON FOR REQUEST: letter for time off    MESSAGE:     Patient requesting doctor's note excusing absence for today and yesterday (July 22, 23).

## 2012-01-15 ENCOUNTER — Telehealth (INDEPENDENT_AMBULATORY_CARE_PROVIDER_SITE_OTHER): Payer: Self-pay | Admitting: Family Medicine

## 2012-01-15 NOTE — Telephone Encounter (Signed)
There are also 2 TE's addressing this issue.     One stated: Patient saw Dr. [name] on 7/22 for fluid in his ear. Patient was advised to use Flonase but he states it is not helping. Patent is also requesting a letter for his employer because he missed work on 7/22 and 7/23.  Closing this as a duplicate.        The other has a letter in computer, pending to be signed.

## 2012-01-15 NOTE — Telephone Encounter (Signed)
There is an ecare msg open on this also. When letter is signed, please notify pt via the ecare msg.

## 2012-01-15 NOTE — Telephone Encounter (Signed)
Letter printed. Waiting for a signature and it is NOT ok to leave a message on VM.

## 2012-01-15 NOTE — Telephone Encounter (Signed)
Defer to PCP

## 2012-01-15 NOTE — Telephone Encounter (Signed)
CONFIRMED PHONE NUMBER: [phone]  CALLERS FIRST AND LAST NAME: Eda Keys   FACILITY NAME: / TITLE: /  CALLERS RELATIONSHIP:Self  RETURN CALL: Detailed message on voicemail only    SUBJECT: General Message   REASON FOR REQUEST: Ear problems, letter for work     MESSAGE: Patient saw Dr. [name] on 7/22 for fluid in his ear. Patient was advised to use Flonase but he states it is not helping. Patent is also requesting a letter for his employer because he missed work on 7/22 and 7/23. Please call to discuss.

## 2012-01-15 NOTE — Telephone Encounter (Signed)
Duplicate request

## 2012-01-15 NOTE — Telephone Encounter (Signed)
Dr, please see ecare questions above.

## 2012-01-16 ENCOUNTER — Encounter (INDEPENDENT_AMBULATORY_CARE_PROVIDER_SITE_OTHER): Payer: Self-pay | Admitting: Family Medicine

## 2012-01-16 NOTE — Telephone Encounter (Signed)
Letter placed up front for pick up. Patient notified via ecare.

## 2012-01-24 ENCOUNTER — Telehealth (INDEPENDENT_AMBULATORY_CARE_PROVIDER_SITE_OTHER): Payer: Self-pay | Admitting: Family Medicine

## 2012-01-24 NOTE — Telephone Encounter (Signed)
Please call about the following    This message is to inform you that the patient has not yet read the following message. (Notification date: January 19, 2012)           From  Andrade, Anthony Caroli [[id number]]    To  Anthony Andrade    Composed  01/16/2012 11:08 AM    For Delivery On  01/16/2012 11:08 AM    Subject  RE: Medication Question    Message Type  Patient Medical Advice Request    Read Status  N    By  Anthony Andrade has not read the message    Message Body  I would usually give you Sudafed but you cannot take Sudafed with high blood pressure so all we can do is Flonase.     I would take blood pressure medication in the evening as long as you can remember it consistently.     ----- Message -----   From: Anthony Andrade   Sent: 01/15/2012 4:23 PM PDT   To: Anthony Sorrow, Anthony Andrade   Subject: RE: Medication Question     I have been using the Flonase or Fluticasone, so I didn't know if that was all I needed to do. Theirs just a lot of pressure, and that strange feeling when you turn your head that's all.     in regards to Losartan. Should it be taken in the morning, or at bed time? Since I already take so many drugs, just trying to find out what's best.     Thank you for the note. I guess my concern was that since I work in a noise place, I was not looking forward to putting ear plugs in for eight hours. Thankfully I got my hands on some ear muffs, but it still painful. So I took some Tylenol, and Allegra.     Thank you Again   Eulis Canner     ----- Message -----   From: Anthony Sorrow, Anthony Andrade   Sent: 01/15/2012 4:05 PM PDT   To: Anthony Haring Bohnet   Subject: RE: Medication Question     Losartan's most common side effect is a cough. I have not had anyone have nausea but I am convinced that any medication can cause nausea.   I printed out a note for you for work. Do you want me to fax it somewhere or will you pick it up?     Your ear has fluid behind it caused by your eustachian tube not  working. This is not working because the nasal tissue has swollen over the opening in your nose. I usually use a steroid nasal inhaler called Flonase or Fluticasone nasal inhaler and use 1 puff daily I the right side of the nose. Do you want me to fax that in for you?         ----- Message -----   From: Anthony Andrade   Sent: 01/14/2012 12:50 PM PDT   To: Anthony Sorrow, Anthony Andrade   Subject: Medication Question     Good afternoon. thank you again for spending so much time with me yesterday, and I hope I didn't screw up your schedule to bad. I had a question about the new blood pressure medicine. When is a good time to take it. I try not to take all my med's at once, and spread them out through out the day. Also does this med cause you to feel nauseous. I forgot to ask you yesterday,  for a note to cover my absents from work for yesterday and today. Also is their anything else I can do for my ear? Like i said it has pressure, and I don't feel really steady when I am walk or bending over. Thank you again for all you did yesterday. Anthony Andrade [phone]

## 2012-01-29 NOTE — Telephone Encounter (Signed)
I called Pt and advised him of Dr. [name] message below. Pt had not read e-care message but I informed him over the phone. Pt will take his BP med at night and call back if his sinus pressure continues or worsens. Closing TE

## 2012-02-04 ENCOUNTER — Ambulatory Visit (INDEPENDENT_AMBULATORY_CARE_PROVIDER_SITE_OTHER): Payer: BLUE CROSS/BLUE SHIELD | Admitting: Family Medicine

## 2012-02-11 ENCOUNTER — Other Ambulatory Visit (INDEPENDENT_AMBULATORY_CARE_PROVIDER_SITE_OTHER): Payer: Self-pay | Admitting: Family Medicine

## 2012-02-13 MED ORDER — LOSARTAN POTASSIUM 50 MG OR TABS
ORAL_TABLET | ORAL | Status: DC
Start: 2012-02-11 — End: 2012-02-15

## 2012-02-15 ENCOUNTER — Encounter (INDEPENDENT_AMBULATORY_CARE_PROVIDER_SITE_OTHER): Payer: Self-pay | Admitting: Family

## 2012-02-15 ENCOUNTER — Ambulatory Visit (INDEPENDENT_AMBULATORY_CARE_PROVIDER_SITE_OTHER): Payer: BLUE CROSS/BLUE SHIELD | Admitting: Family

## 2012-02-15 VITALS — BP 137/91 | HR 79 | Temp 98.3°F | Wt 263.0 lb

## 2012-02-15 DIAGNOSIS — I10 Essential (primary) hypertension: Secondary | ICD-10-CM

## 2012-02-15 MED ORDER — LOSARTAN POTASSIUM 50 MG OR TABS
ORAL_TABLET | ORAL | Status: DC
Start: 2012-02-15 — End: 2012-03-13

## 2012-02-15 NOTE — Patient Instructions (Addendum)
It was a pleasure to see you in clinic today.               Probiotic literature  [facility] [place]       You can schedule an appointment to see [place] by calling [phone] or via eCare. If you are not yet signed up for eCare, please speak with a team member at the front desk.  eCare  enrollment will allow you to make appointments online, view test results and obtain a copy of our After Visit Summary.    If labs were ordered today the results are expected to be available via eCare 5 days later. If viewing your results on eCare, please make sure to scroll all the way to the bottom of the results to view any messages written by your provider.  If you have an active eCare account, this is where we will submit your results.  If you wish to receive the results over the phone or via letter, please call our office directly at [phone] and leave a message for your provider.    Otherwise, result letters are mailed 7-14 days after your tests are completed. If your physician needs to change your care based on your results, you will receive a phone call to notify you. If you haven't heard from him/her and it has been more than 14 days please give [place] a call.     Medication Refills: If you need to get a prescription refill, please contact your pharmacy 1 week before your current supply will run out to request the refill.  Contacting your pharmacy is the fastest and safest way to obtain a medication refill.  The pharmacy will notify our office.  Please note, that a minimum of 48 to 72 hours is needed to refill a medication.  Please call your pharmacy early to allow enough time to refill before you anticipate running out.    We know you have a choice in where you receive your healthcare and we sincerely thank you for choosing [facility].

## 2012-02-16 ENCOUNTER — Encounter (INDEPENDENT_AMBULATORY_CARE_PROVIDER_SITE_OTHER): Payer: Self-pay | Admitting: Family

## 2012-02-16 DIAGNOSIS — I10 Essential (primary) hypertension: Secondary | ICD-10-CM | POA: Insufficient documentation

## 2012-02-16 DIAGNOSIS — G8929 Other chronic pain: Secondary | ICD-10-CM

## 2012-02-16 HISTORY — DX: Essential (primary) hypertension: I10

## 2012-02-16 HISTORY — DX: Other chronic pain: G89.29
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
## 2012-02-25 ENCOUNTER — Ambulatory Visit (INDEPENDENT_AMBULATORY_CARE_PROVIDER_SITE_OTHER): Payer: BLUE CROSS/BLUE SHIELD | Admitting: Family Medicine

## 2012-03-02 ENCOUNTER — Ambulatory Visit (INDEPENDENT_AMBULATORY_CARE_PROVIDER_SITE_OTHER): Payer: BLUE CROSS/BLUE SHIELD | Admitting: Family

## 2012-03-09 ENCOUNTER — Ambulatory Visit (INDEPENDENT_AMBULATORY_CARE_PROVIDER_SITE_OTHER): Payer: BLUE CROSS/BLUE SHIELD | Admitting: Family

## 2012-03-13 ENCOUNTER — Telehealth (INDEPENDENT_AMBULATORY_CARE_PROVIDER_SITE_OTHER): Payer: Self-pay | Admitting: Family

## 2012-03-13 ENCOUNTER — Ambulatory Visit (INDEPENDENT_AMBULATORY_CARE_PROVIDER_SITE_OTHER): Payer: BLUE CROSS/BLUE SHIELD | Admitting: Family

## 2012-03-13 ENCOUNTER — Encounter (INDEPENDENT_AMBULATORY_CARE_PROVIDER_SITE_OTHER): Payer: Self-pay | Admitting: Family

## 2012-03-13 VITALS — BP 134/85 | HR 92 | Temp 97.4°F | Ht 70.0 in | Wt 257.4 lb

## 2012-03-13 DIAGNOSIS — R739 Hyperglycemia, unspecified: Secondary | ICD-10-CM

## 2012-03-13 DIAGNOSIS — I10 Essential (primary) hypertension: Secondary | ICD-10-CM

## 2012-03-13 DIAGNOSIS — R7309 Other abnormal glucose: Secondary | ICD-10-CM

## 2012-03-13 DIAGNOSIS — K219 Gastro-esophageal reflux disease without esophagitis: Secondary | ICD-10-CM

## 2012-03-13 DIAGNOSIS — E039 Hypothyroidism, unspecified: Secondary | ICD-10-CM

## 2012-03-13 MED ORDER — ESOMEPRAZOLE MAGNESIUM 40 MG OR CPDR
DELAYED_RELEASE_CAPSULE | ORAL | Status: DC
Start: 2012-03-13 — End: 2012-07-30

## 2012-03-13 MED ORDER — LOSARTAN POTASSIUM 50 MG OR TABS
ORAL_TABLET | ORAL | Status: DC
Start: 2012-03-13 — End: 2012-07-02

## 2012-03-13 NOTE — Patient Instructions (Addendum)
It was a pleasure to see you in clinic today.                See Lyda Kalata message with new information    You can schedule an appointment to see [place] by calling [phone] or via eCare. If you are not yet signed up for eCare, please speak with a team member at the front desk.  eCare  enrollment will allow you to make appointments online, view test results and obtain a copy of our After Visit Summary.    If labs were ordered today the results are expected to be available via eCare 5 days later. If viewing your results on eCare, please make sure to scroll all the way to the bottom of the results to view any messages written by your provider.  If you have an active eCare account, this is where we will submit your results.  If you wish to receive the results over the phone or via letter, please call our office directly at [phone] and leave a message for your provider.    Otherwise, result letters are mailed 7-14 days after your tests are completed. If your physician needs to change your care based on your results, you will receive a phone call to notify you. If you haven't heard from him/her and it has been more than 14 days please give [place] a call.     Medication Refills: If you need to get a prescription refill, please contact your pharmacy 1 week before your current supply will run out to request the refill.  Contacting your pharmacy is the fastest and safest way to obtain a medication refill.  The pharmacy will notify our office.  Please note, that a minimum of 48 to 72 hours is needed to refill a medication.  Please call your pharmacy early to allow enough time to refill before you anticipate running out.    We know you have a choice in where you receive your healthcare and we sincerely thank you for choosing [facility].

## 2012-03-13 NOTE — Telephone Encounter (Signed)
Patient was a no show for their labs today.  Lab placed as a future order with an expected date of one week.

## 2012-03-13 NOTE — Progress Notes (Signed)
Anthony Andrade is  here with chief complaint:      #1 Depression Medications  Duration Several yrs.   Quality Feels sluggish and foggy on all these meds & wants to reevaluate them  Timing: gets irritable; not sure if he is even depressed  Severity: 5/10 - goes up and down;   Context: Dx with chronic disease which interferes with work. Has been placed on several medications and thinks all of these are making it worse.  Modifying factors:  Less pain; wants to loose weight     #2:  HTN  Duration years  Quality no sxs  Timing: getting better as he is loosing weight now  Severity: was never moret han 150/100 and that was rare  Context: Has many stressful circumstances going on. Wants to cut back; lost 5 pounds and feels really good about that  Modifying factors: meds, lost weight; better diet plan now      Past Medical History   Diagnosis Date   . Ankylosing spondylitis    . Esophageal reflux    . Allergic rhinitis due to other allergen    . Unspecified sleep apnea    . Restless legs syndrome (RLS)      Past Surgical History   Procedure Date   . Removal gallbladder    . Shoulder surg proc unlisted      left shoulder   . Upper gi endoscopy,diagnosis 2008     on prevacid   . Colonoscopy 2008     normal per patient       Patient Active Problem List   Diagnosis   . DEPRESSIVE DISORDER NEC   . ALLERGIC RHINITIS NOS   . ANKYLOSING SPONDYLITIS   . Chronic pain   . Hypertension     Current Outpatient Prescriptions   Medication Sig   . ADALIMUMAB SC None Entered   . Albuterol 90 MCG/ACT Inhalation Aero Soln 2 puffs inhaled every 4 hours as needed for wheezing and/or cough   . BuPROPion HCl (WELLBUTRIN XL) 300 MG Oral TABLET SR 24 HR 1 daily   . Celecoxib (CELEBREX) 200 MG Oral Cap 1 capsule once daily   . Cyclobenzaprine HCl 10 MG Oral Tab 1 TABLET 3 TIMES DAILY   . Esomeprazole Magnesium (NEXIUM OR) None Entered   . Fexofenadine HCl (ALLEGRA OR) None Entered   . Fluticasone-Salmeterol (ADVAIR HFA) 115-21 MCG/ACT  Inhalation Aerosol 1 puff twice daily   . Folic Acid 1 MG Oral Tab 1 TABLET DAILY   . Hydrocodone-Acetaminophen 5-500 MG Oral Tab 1-2 TABLETS at bedtime   . Indomethacin CR (INDOCIN SR) 75 MG OR CPCR 1 CAPSULE DAILY   . InFLIXimab (REMICADE) 100 MG Intravenous Recon Soln 600 mg once every 6 weeks   . Levothyroxine Sodium 75 MCG Oral Tab take 1 tablet by mouth once daily for THYROID REPLACEMENT   . Lorazepam 2 MG Oral Tab 1 TABLET TWICE DAILY   . Losartan Potassium 50 MG Oral Tab take 1 tablet by mouth once daily   . Pregabalin (LYRICA) 25 MG Oral Cap 1 tablet daily   . Sertraline HCl (ZOLOFT) 100 MG OR TABS Take 1 tablet by mouth every night for mood   . Sulfasalazine (SULFAZINE) 500 MG Oral Tab 3 Tablets three times daily   . TiZANidine HCl 4 MG Oral Tab 1 tablet every 8 hours as needed for pain       Allergies-Morphine and Penicillins    ROS: Please refer to HPI  PHYSICAL EXAM:  General: healthy, alert, no distress  Skin: Skin color, texture, turgor normal. No rashes or concerning lesions  Lungs: clear to auscultation  Heart: normal rate, regular rhythm and no murmurs, clicks, or gallops  Back: Back symmetric, no deformity; ROM normal; No CVA tenderness.    Reviewed labs and noted high glucose level on several occasions  ASSESSMENT/PLAN:      790.29 Hyperglycemia  (primary encounter diagnosis)  Plan: A1C level future  Follow current plan for dense food and lower simple  carbs    401.9 HTN (hypertension)  Plan: Losartan Potassium 50 MG Oral Tab  1/2 to 1 tablet as needed as weight is reduced. He has lost 5 pounds    History of Depression by self report and Chart Review  Reviewing medications and looking at options;  May consider weaning of everything vs using alternatives. Such as Cymbalta.    Cymbalta as a trial and crosswalk to the Zoloft.   Wellbutrin will be weaned     Reordered nexium for GERD  GOAL: Go on the anti inflammation diet ; Use other methods of pain management.   Note that he is coming of the  indomethacin - and remicade and he is on humara      We will discuss this at our upcoming phone visit.  He is work on his diet    He has been going throug rhuemaologist   8/19 - Anthony Andrade through the [facility]  [phone]  Fax [phone]  Alpha Lipoic Acid       FMLA paper   Refer to Patient instruction on AVS as well

## 2012-03-15 ENCOUNTER — Other Ambulatory Visit (INDEPENDENT_AMBULATORY_CARE_PROVIDER_SITE_OTHER): Payer: Self-pay | Admitting: Family

## 2012-03-15 ENCOUNTER — Telehealth (INDEPENDENT_AMBULATORY_CARE_PROVIDER_SITE_OTHER): Payer: Self-pay | Admitting: Family

## 2012-03-15 NOTE — Telephone Encounter (Signed)
I left a message for him regarding his medications.  I reviewed his chart and there is very little documentation explaining why he is on the antidepressants, neither of which are noted for reducing pain.  I do want to speak with him further.     Will try again Monday afternoon.

## 2012-03-16 ENCOUNTER — Telehealth (INDEPENDENT_AMBULATORY_CARE_PROVIDER_SITE_OTHER): Payer: Self-pay | Admitting: Family

## 2012-03-16 NOTE — Telephone Encounter (Signed)
Anthony Andrade,    Sorry I missed you twice by phone.  Please let me know if there is a time on Tuesday that works well for you.  I am in meetings 9-10 and 1-3 pm.    [name]

## 2012-03-17 ENCOUNTER — Other Ambulatory Visit (INDEPENDENT_AMBULATORY_CARE_PROVIDER_SITE_OTHER): Payer: Self-pay | Admitting: Family Medicine

## 2012-03-17 ENCOUNTER — Encounter (INDEPENDENT_AMBULATORY_CARE_PROVIDER_SITE_OTHER): Payer: Self-pay | Admitting: Family

## 2012-03-17 NOTE — Telephone Encounter (Signed)
Dr. Nila Nephew see eCare response. Patient inquiring about a Cymbalta rx as seen in chart review of 03/13/12 ov. Patient also requesting clarification of items discussed at 03/13/12 ov as well. Thank you.

## 2012-03-24 ENCOUNTER — Encounter (INDEPENDENT_AMBULATORY_CARE_PROVIDER_SITE_OTHER): Payer: Self-pay

## 2012-03-29 ENCOUNTER — Encounter (INDEPENDENT_AMBULATORY_CARE_PROVIDER_SITE_OTHER): Payer: Self-pay | Admitting: Family

## 2012-03-30 ENCOUNTER — Encounter (INDEPENDENT_AMBULATORY_CARE_PROVIDER_SITE_OTHER): Payer: Self-pay | Admitting: Family Medicine

## 2012-03-30 DIAGNOSIS — F32A Depression, unspecified: Secondary | ICD-10-CM

## 2012-03-30 NOTE — Telephone Encounter (Signed)
Please see eCare message. Patient has included list of medication as requested.

## 2012-03-31 NOTE — Telephone Encounter (Signed)
Kerry please see eCare message

## 2012-04-13 ENCOUNTER — Encounter (INDEPENDENT_AMBULATORY_CARE_PROVIDER_SITE_OTHER): Payer: Self-pay | Admitting: Family

## 2012-04-16 ENCOUNTER — Other Ambulatory Visit (INDEPENDENT_AMBULATORY_CARE_PROVIDER_SITE_OTHER): Payer: Self-pay | Admitting: Family

## 2012-04-16 DIAGNOSIS — F32A Depression, unspecified: Secondary | ICD-10-CM

## 2012-04-16 NOTE — Telephone Encounter (Signed)
I am concerned about starting Cymbalta without an update of his medication plan.  Requested he either make a follow up appointment or he send an updated list of his medication.

## 2012-04-16 NOTE — Telephone Encounter (Signed)
Refill request for medication Cymbalta per Provider Meyer's Ecare message, pharmacy hasn't received RX request please advise

## 2012-04-24 ENCOUNTER — Ambulatory Visit (INDEPENDENT_AMBULATORY_CARE_PROVIDER_SITE_OTHER): Payer: BLUE CROSS/BLUE SHIELD | Admitting: Family Medicine

## 2012-04-27 ENCOUNTER — Ambulatory Visit (INDEPENDENT_AMBULATORY_CARE_PROVIDER_SITE_OTHER): Payer: BLUE CROSS/BLUE SHIELD | Admitting: Family

## 2012-07-02 ENCOUNTER — Other Ambulatory Visit (INDEPENDENT_AMBULATORY_CARE_PROVIDER_SITE_OTHER): Payer: Self-pay | Admitting: Family

## 2012-07-02 DIAGNOSIS — I10 Essential (primary) hypertension: Secondary | ICD-10-CM

## 2012-07-03 MED ORDER — LOSARTAN POTASSIUM 50 MG OR TABS
ORAL_TABLET | ORAL | Status: DC
Start: 2012-07-02 — End: 2012-08-10

## 2012-07-03 NOTE — Telephone Encounter (Signed)
BP Readings from Last 3 Encounters:   03/13/12 134/85   02/15/12 137/91   01/13/12 149/89     Patient has an appointment on 07/20/12 - Issuing refill to last until follow-up.     Arlyce Harman, PharmD

## 2012-07-20 ENCOUNTER — Ambulatory Visit (INDEPENDENT_AMBULATORY_CARE_PROVIDER_SITE_OTHER): Payer: Self-pay | Admitting: Internal Medicine

## 2012-07-21 ENCOUNTER — Encounter (INDEPENDENT_AMBULATORY_CARE_PROVIDER_SITE_OTHER): Payer: Self-pay

## 2012-07-25 ENCOUNTER — Ambulatory Visit (INDEPENDENT_AMBULATORY_CARE_PROVIDER_SITE_OTHER): Payer: BLUE CROSS/BLUE SHIELD | Admitting: Internal Medicine

## 2012-07-27 ENCOUNTER — Encounter (INDEPENDENT_AMBULATORY_CARE_PROVIDER_SITE_OTHER): Payer: Self-pay | Admitting: Family

## 2012-07-30 ENCOUNTER — Encounter (INDEPENDENT_AMBULATORY_CARE_PROVIDER_SITE_OTHER): Payer: Self-pay | Admitting: Family Medicine

## 2012-07-30 ENCOUNTER — Ambulatory Visit (INDEPENDENT_AMBULATORY_CARE_PROVIDER_SITE_OTHER): Payer: BLUE CROSS/BLUE SHIELD | Admitting: Family Medicine

## 2012-07-30 VITALS — BP 133/77 | HR 109 | Temp 101.2°F | Ht 70.0 in | Wt 261.0 lb

## 2012-07-30 DIAGNOSIS — I10 Essential (primary) hypertension: Secondary | ICD-10-CM

## 2012-07-30 DIAGNOSIS — E039 Hypothyroidism, unspecified: Secondary | ICD-10-CM

## 2012-07-30 DIAGNOSIS — R739 Hyperglycemia, unspecified: Secondary | ICD-10-CM

## 2012-07-30 DIAGNOSIS — R7309 Other abnormal glucose: Secondary | ICD-10-CM

## 2012-07-30 DIAGNOSIS — J45901 Unspecified asthma with (acute) exacerbation: Secondary | ICD-10-CM

## 2012-07-30 DIAGNOSIS — J209 Acute bronchitis, unspecified: Secondary | ICD-10-CM

## 2012-07-30 DIAGNOSIS — D849 Immunodeficiency, unspecified: Secondary | ICD-10-CM

## 2012-07-30 LAB — LIPID PANEL
Cholesterol (LDL): 96 mg/dL (ref <130)
Cholesterol/HDL Ratio: 5.6
HDL Cholesterol: 28 mg/dL — ABNORMAL LOW (ref >40)
Non-HDL Cholesterol: 130 mg/dL (ref 0–159)
Total Cholesterol: 158 mg/dL (ref <200)
Triglyceride: 168 mg/dL — ABNORMAL HIGH (ref <150)

## 2012-07-30 LAB — T4, FREE: Thyroxine (Free): 0.6 ng/dL (ref 0.6–1.2)

## 2012-07-30 LAB — THYROID STIMULATING HORMONE: Thyroid Stimulating Hormone: 0.827 u[IU]/mL (ref 0.400–5.000)

## 2012-07-30 LAB — PR A1C RAPID, ONSITE: Hemoglobin A1C: 6.2 % — ABNORMAL HIGH (ref 4.0–6.0)

## 2012-07-30 MED ORDER — PREDNISONE 20 MG OR TABS
ORAL_TABLET | ORAL | Status: DC
Start: 2012-07-30 — End: 2012-08-10

## 2012-07-30 MED ORDER — ALBUTEROL SULFATE HFA 108 (90 BASE) MCG/ACT IN AERS
INHALATION_SPRAY | RESPIRATORY_TRACT | Status: DC
Start: 2012-07-30 — End: 2013-07-15

## 2012-07-30 MED ORDER — CLARITHROMYCIN 500 MG OR TABS
ORAL_TABLET | ORAL | Status: DC
Start: 2012-07-30 — End: 2012-08-10

## 2012-07-30 MED ORDER — HYDROCOD POLST-CPM POLST ER 10-8 MG/5ML OR SUER
ORAL | Status: DC
Start: 2012-07-30 — End: 2012-08-04

## 2012-07-30 NOTE — Patient Instructions (Signed)
It was a pleasure to see you in clinic today.                  If you are not yet signed up for eCare, please speak with a team member at the front desk who would happy to assist you with this process.      eCare  enrollment will allow you access to the below benefits   You can make appointments online   View test results / Lab Results   Obtain a copy of our After Visit Summary (a summary of your visit today)     Your Test Results:  If labs were ordered today the results are expected to be available via eCare in about 5 days. If you have an active eCare account, this is how we will notify you of your results.     If you do not have an eCare account then your test results will be mailed to you within about 14 days after your tests are completed. If your physician needs to change your care based on your results or is concerned, you will receive a phone call.     If you have any questions about your test results please schedule an appointment with your provider.    **If it has been more than 2 weeks and you have not received your test results please send our office a message via eCare.    Medication Refills: If you need a prescription refilled, please contact your pharmacy 1 week before your current supply will run out to request the refill.  Contacting your pharmacy is the fastest and safest way to obtain a medication refill.  The pharmacy will notify our office.  Please note, that a minimum of 48 to 72 hours is needed to refill a medication, but refills are usually processed and sent to your pharmacy in about 5 business days.  Please call your pharmacy early to allow enough time to refill before you anticipate running out.  For faster medication refills, you can also schedule an appointment with your provider.    We know you have a choice in where you receive your healthcare and we sincerely thank you for trusting [facility] with your health.

## 2012-07-30 NOTE — Progress Notes (Signed)
Anthony Andrade is a 47 year old  year old male who presents with a respiratory illness of 5 days duration. Symptoms include productive cough with sputum described as brown, rhinorrhea,fever, chills or sweats and the course has been one of gradually worsening.     Outpatient Prescriptions Prior to Visit   Medication Sig Dispense Refill   . ADALIMUMAB SC None Entered       . Albuterol 90 MCG/ACT Inhalation Aero Soln 2 puffs inhaled every 4 hours as needed for wheezing and/or cough  1 Inhaler  1   . BuPROPion HCl (WELLBUTRIN XL) 300 MG Oral TABLET SR 24 HR 1 daily       . Celecoxib (CELEBREX) 200 MG Oral Cap 1 capsule once daily       . Esomeprazole Magnesium (NEXIUM OR) None Entered       . Esomeprazole Magnesium (NEXIUM) 40 MG Oral CAPSULE DELAYED RELEASE one tablet twice daily  60 Cap  6   . Fexofenadine HCl (ALLEGRA OR) None Entered       . Fluticasone-Salmeterol (ADVAIR HFA) 115-21 MCG/ACT Inhalation Aerosol 1 puff twice daily  1 Inhaler  1   . Folic Acid 1 MG Oral Tab 1 TABLET DAILY       . Hydrocodone-Acetaminophen 5-500 MG Oral Tab 1-2 TABLETS at bedtime       . Indomethacin CR (INDOCIN SR) 75 MG OR CPCR 1 CAPSULE DAILY  30 Cap  0   . InFLIXimab (REMICADE) 100 MG Intravenous Recon Soln 600 mg once every 6 weeks       . Levothyroxine Sodium 75 MCG Oral Tab take 1 tablet by mouth once daily for THYROID REPLACEMENT  90 Tab  3   . Lorazepam 2 MG Oral Tab 1 TABLET TWICE DAILY       . Losartan Potassium 50 MG Oral Tab take 1/2 to 1 tablet by mouth once daily**Please follow-up at 07/20/12 appointment**  30 Tab  0   . Pregabalin (LYRICA) 25 MG Oral Cap 1 tablet daily    0   . Sertraline HCl (ZOLOFT) 100 MG OR TABS Take 1 tablet by mouth every night for mood  30 Tab  1   . Sulfasalazine (SULFAZINE) 500 MG Oral Tab 3 Tablets three times daily       . TiZANidine HCl 4 MG Oral Tab 1 tablet every 8 hours as needed for pain  90 Tab  0     No facility-administered medications prior to visit.     Review of patient's  allergies indicates:  Allergies   Allergen Reactions   . Morphine Nausea/Vomiting   . Penicillins Rash     Past Medical History   Diagnosis Date   . Ankylosing spondylitis    . Esophageal reflux    . Allergic rhinitis due to other allergen    . Unspecified sleep apnea    . Restless legs syndrome (RLS)      Recent travel or exposure to traveler-No  Ill contacts-Yes: son  Tobacco usage-No  pets and seasonal allergies-Yes: allergic rhinitis  ROS: Constitutional: No  fever. No  wt change over 5 lbs.            CV:  No  chest pain/palpitations.            RESP: productive  Cough. No SOB.            GI: No  nausea/vomiting. No change in bowel habits.  GU: No  urinary sx. no menses problems.            INTEG:No  rash.            ALLERGY/IMMUNOL:No  allergic sx. No  recent        infections.            MUSCULOSKELETAL: No hot,swollen or tender joints.   O:  BP 133/77  Pulse 109  Temp(Src) 101.2 F (38.4 C) (Oral)  Ht 5\' 10"  (1.778 m)  Wt 261 lb (118.389 kg)  BMI 37.45 kg/m2  SpO2 97%  PF 450 L/min  General appearance*: Color is normal, Weight appears heavy  for height.  and No acute distress  Affect*: Judgement/insight normal, Mood/affect normal, Orientation oriented to time, person and place and Recent/remote Memory normal  Ears:         positive findings: R TM - amber, L TM - amber  Nose:         cloudy rhinorrhea, mucosal erythema and mucosal edema  Oropharynx:   mild erythema  Neck:         Supple, nontender, no adenopathy  Lungs:        inspiratory rhonchi, expiratory wheezes and Bilateral breath sounds clear and equal,No wheezes, rhonchi, or rales  A:(466.0) Acute bronchitis  (primary encounter diagnosis)  Plan:1) Clarithromycin 500 MG Oral Tab given  2) Hydrocod  Polst-CPM Polst ER (TUSSIONEX PENNKINETIC ER)   10-8 MG/5ML Oral Liquid CR given      (401.9) HTN (hypertension)  Plan: LIPID PANEL  ordered    (790.29) Hyperglycemia  Plan: A1C, ONSITE, RAPID  ordered    (244.9) Hypothyroid  Plan: THYROID  STIMULATING HORMONE, T4, FREE   ordered    (279.3) Immunocompromised patient  Plan: noted and treated more aggressively    (493.92) Asthma exacerbation  Plan:1) Albuterol Sulfate HFA (PROAIR HFA) 108 (90  BASE) MCG/ACT Inhalation Aero Soln refilled  2) Prednisone  20 MG Oral Tab   Started to control cough  Return to clinic as needed for followup

## 2012-08-03 ENCOUNTER — Telehealth (INDEPENDENT_AMBULATORY_CARE_PROVIDER_SITE_OTHER): Payer: Self-pay | Admitting: Family Medicine

## 2012-08-03 ENCOUNTER — Encounter (INDEPENDENT_AMBULATORY_CARE_PROVIDER_SITE_OTHER): Payer: Self-pay | Admitting: Family Medicine

## 2012-08-03 NOTE — Telephone Encounter (Signed)
CONFIRMED PHONE NUMBER: [phone]  CALLERS FIRST AND LAST NAME: Anthony Andrade  FACILITY NAME:  TITLE:   CALLERS RELATIONSHIP:Self  RETURN CALL: General message OK    SUBJECT: General Message   REASON FOR REQUEST: patient was last seen on [id number] for bronchitis. Patient ran out of his cough medication, but still feels under the weather. Please advise on next steps to take. If patient should be prescribed another antibiotics or to be seen by dr. [name] again.     MESSAGE: please see above

## 2012-08-03 NOTE — Telephone Encounter (Signed)
Pt also sent an email encounter.  I am transferring this information to that encounter as it has more requests and information in it.  Closing this TE.

## 2012-08-04 ENCOUNTER — Ambulatory Visit (INDEPENDENT_AMBULATORY_CARE_PROVIDER_SITE_OTHER): Payer: BLUE CROSS/BLUE SHIELD | Admitting: Family Medicine

## 2012-08-04 ENCOUNTER — Encounter (INDEPENDENT_AMBULATORY_CARE_PROVIDER_SITE_OTHER): Payer: Self-pay | Admitting: Family Medicine

## 2012-08-04 VITALS — BP 157/94 | HR 87 | Temp 98.0°F | Wt 260.2 lb

## 2012-08-04 DIAGNOSIS — E039 Hypothyroidism, unspecified: Secondary | ICD-10-CM

## 2012-08-04 DIAGNOSIS — J209 Acute bronchitis, unspecified: Secondary | ICD-10-CM

## 2012-08-04 MED ORDER — BECLOMETHASONE DIPROPIONATE 40 MCG/ACT IN AERS
INHALATION_SPRAY | RESPIRATORY_TRACT | Status: DC
Start: 2012-08-04 — End: 2012-09-01

## 2012-08-04 MED ORDER — HYDROCOD POLST-CPM POLST ER 10-8 MG/5ML OR SUER
ORAL | Status: DC
Start: 2012-08-04 — End: 2012-08-10

## 2012-08-04 MED ORDER — FLUTICASONE PROPIONATE 50 MCG/ACT NA SUSP
NASAL | Status: DC
Start: 2012-08-04 — End: 2012-09-01

## 2012-08-04 MED ORDER — BENZONATATE 100 MG OR CAPS
ORAL_CAPSULE | ORAL | Status: DC
Start: 2012-08-04 — End: 2012-08-10

## 2012-08-04 NOTE — Progress Notes (Signed)
Anthony Andrade is a 47 year old male.    Chief Complaint   Patient presents with   . Cough   . Dizziness       HPI:  1) Ill since 07/25/12  Missed work since then  Needs note and FMLA form completed    Cough, productive - multiple colors mucous  Chest tight and heavy  Very tired, sleeping a lot  Cough persistent  Taking meds  Pushing fluids    Saw Dr [name] 07/30/12  After got chills    Pt is immunosuppressed - on Humira for AS    Got Prednisone, albuterol, Biaxin and cough med from Dr Read-Williams  Improved some but still not feeling well    Has been taking meds and resting    Sleeping sitting up  Cough disrupting sleep still    2) Hypothyroidism  H/o  Was on Levothyroxine 75 mcg  Ran out ~ 1 mo ago  Feeling well  No heat/cold intolerance, weight changes, skin/nail/hair changes, thyroid enlargment, hoarseness, fatigue, change sin bowel habits.    The above HPI includes the following elements:  context, duration, location, quality and severity    ROS:   Constitutional: Positive for fatigue   Eyes: Negative    Ears, Nose, Mouth, Throat: As noted in HPI above   Cardiovascular: Negative    Respiratory: As noted in HPI above   Gastrointestinal: As noted in HPI above   Genitourinary: Negative   Musculoskeletal: Negative    Skin: Negative    Neurological: Negative    Psychiatric: Negative    Endocrine: Negative    Hematologic/Lymphatic: Negative   Allergic/Immunologic: Negative     Past Medical History   Diagnosis Date   . Ankylosing spondylitis    . Esophageal reflux    . Allergic rhinitis due to other allergen    . Unspecified sleep apnea    . Restless legs syndrome (RLS)      Past Surgical History   Procedure Laterality Date   . Cholecystectomy     . Unlisted procedure shoulder       left shoulder   . Esophagogastroduodenoscopy transoral diagnostic  2008     on prevacid   . Colonoscopy stoma dx w/wo collj spec spx  2008     normal per patient     Review of patient's allergies indicates:  Allergies   Allergen  Reactions   . Morphine Nausea/Vomiting   . Penicillins Rash     Meds:   Outpatient Prescriptions Prior to Visit   Medication Sig Dispense Refill   . Adalimumab (HUMIRA) 40 MG/0.8ML Subcutaneous Kit every other week       . Albuterol Sulfate HFA (PROAIR HFA) 108 (90 BASE) MCG/ACT Inhalation Aero Soln 2 puffs inhaled every 4 hours as needed for asthma; may use every 2 hours for attacks, or preventively before exercise  1 Inhaler  6   . Clarithromycin 500 MG Oral Tab Take 1 tablet twice daily until gone  20 Tab  0   . DULoxetine HCl 30 MG Oral CAPSULE ENTERIC COATED PARTICLES Take 1 cap daily       . Esomeprazole Magnesium (NEXIUM OR) None Entered       . Hydrocod Polst-CPM Polst ER (TUSSIONEX PENNKINETIC ER) 10-8 MG/5ML Oral Liquid CR Take 1 teaspoonful every 12 hours as needed for cough  60 mL  0   . Levothyroxine Sodium 75 MCG Oral Tab take 1 tablet by mouth once daily for THYROID REPLACEMENT  90 Tab  3   . Lorazepam 2 MG Oral Tab 1 TABLET TWICE DAILY       . Losartan Potassium 50 MG Oral Tab take 1/2 to 1 tablet by mouth once daily**Please follow-up at 07/20/12 appointment**  30 Tab  0   . PredniSONE 20 MG Oral Tab 3 tablets daily x 3 days then 2 tablets daily x 3 days then 1 tablet daily x 3 days then 1/2 tablet daily x 4 days  20 Tab  0   . Pregabalin (LYRICA) 25 MG Oral Cap 1 tablet daily    0   . TiZANidine HCl 4 MG Oral Tab 1 tablet every 8 hours as needed for pain  90 Tab  0     No facility-administered medications prior to visit.         No family history on file.    History     Social History   . Marital Status: Married     Spouse Name: N/A     Number of Children: N/A   . Years of Education: N/A     Occupational History   . Not on file.     Social History Main Topics   . Smoking status: Never Smoker    . Smokeless tobacco: Not on file   . Alcohol Use: Not on file   . Drug Use: Not on file   . Sexually Active: Not on file     Other Topics Concern   . Not on file     Social History Narrative    Recently moved  from [place].  Works for [employer].  One daughter.  Sister is an Charity fundraiser.         02/15/12    Lives with his wife.    Daughter is studious     Son has been diagnosed with mild asperger's syndrome.    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  and No acute distress  Affect*: Judgement/insight normal, Mood/affect normal, Orientation oriented to time, person and place and Recent/remote Memory normal     HEENT: Eyes clear, anicteric                   Tms and EAC nl                   Nasal mucosal congested, cloudy mucous                  O/P nl, MM moist      Neck: supple, no nodes, nl thyroid      Chest: CTA and P, no CVAT     COR: RRR, no murmur, rub or gallop, rad pulses 2+      Abd: benign, soft, NT, no HSM or masses      Ext: w/d, no edema      Skin: no rashes     A/P    (466.0) Bronchitis, acute, with bronchospasm  (primary encounter diagnosis)  Plan: Fluticasone Propionate 50 MCG/ACT Nasal         Suspension, Beclomethasone Dipropionate (QVAR)         40 MCG/ACT Inhalation Aero Soln, Benzonatate         (  TESSALON PERLES) 100 MG Oral Cap, Hydrocod         Polst-CPM Polst ER (TUSSIONEX PENNKINETIC ER)         10-8 MG/5ML Oral Liquid CR, Cetirizine HCl 10         MG Oral Tab, SPEC REPORTS > USUAL MED         COMUNICAJ/STAND RPRTG          (244.9) Unspecified hypothyroidism  Comment: doing ok off Levothyroxine x 1 mo, TSH normal, repeat again in 1-2 mo  Plan: THYROID STIMULATING HORMONE      MEDICAL DECISION MAKING:  # of diagnoses or management options: multiple (c)  Amount and/or complexity of data to be reviewed: limited (b)  Risk of complications and/or morbidity or mortality: moderate (c)    F/u: 2 weeks

## 2012-08-04 NOTE — Patient Instructions (Addendum)
.  It was a pleasure to see you in clinic today.                  If you are not yet signed up for eCare, please speak with a team member at the front desk who would happy to assist you with this process.      eCare  enrollment will allow you access to the below benefits   You can make appointments online   View test results / Lab Results   Obtain a copy of our After Visit Summary (a summary of your visit today)     Your Test Results:  If labs were ordered today the results are expected to be available via eCare in about 5 days. If you have an active eCare account, this is how we will notify you of your results.     If you do not have an eCare account then your test results will be mailed to you within about 14 days after your tests are completed. If your physician needs to change your care based on your results or is concerned, you will receive a phone call.     If you have any questions about your test results please schedule an appointment with your provider.    **If it has been more than 2 weeks and you have not received your test results please send our office a message via eCare.    Medication Refills: If you need a prescription refilled, please contact your pharmacy 1 week before your current supply will run out to request the refill.  Contacting your pharmacy is the fastest and safest way to obtain a medication refill.  The pharmacy will notify our office.  Please note, that a minimum of 48 to 72 hours is needed to refill a medication, but refills are usually processed and sent to your pharmacy in about 5 business days.  Please call your pharmacy early to allow enough time to refill before you anticipate running out.  For faster medication refills, you can also schedule an appointment with your provider.    We know you have a choice in where you receive your healthcare and we sincerely thank you for trusting [facility] with your health.    Saline for gargles or nose drops    1 cup warm  water  1/2 tsp of table salt  1/4 tsp of baking soda  mix until desolved      Rest, fluids  Vitamin C [phone] mg 1-2x/d  Decongestant ( sudafed) for congestion  Antihistamine for runny nose, post nasal drip  Saline nose drops (ocean)  Tylenol or Aspirin or Ibuprofen or Aleve for body aches, headache, fevers  Consider Afrin pre plane flight if other measures not relieving nasal congestion and ear pressure  Throat losenges and/or cough syrup  Hot water with lemon and honey can help cough and soothe throat    Finish Biaxin and Prednisone taper  Use albuterol as needed    Add inhaled steroid and nasal steroid    Re check in 1 week if still missing work, otherwise if improved enough to return to work then follow up in 2 weeks

## 2012-08-07 ENCOUNTER — Telehealth (INDEPENDENT_AMBULATORY_CARE_PROVIDER_SITE_OTHER): Payer: Self-pay | Admitting: Family Medicine

## 2012-08-07 DIAGNOSIS — J209 Acute bronchitis, unspecified: Secondary | ICD-10-CM

## 2012-08-07 DIAGNOSIS — M459 Ankylosing spondylitis of unspecified sites in spine: Secondary | ICD-10-CM

## 2012-08-07 DIAGNOSIS — J45901 Unspecified asthma with (acute) exacerbation: Secondary | ICD-10-CM

## 2012-08-07 NOTE — Telephone Encounter (Signed)
Received a Disability form   form via fax. Form placed in Dr.Anderson's in box to complete    Main contact for form if there are questions/concerns: [employer]  Phone #: [phone] opt:4 Fax #: [phone]  Please route back to the Team Pool when complete.

## 2012-08-08 ENCOUNTER — Encounter (INDEPENDENT_AMBULATORY_CARE_PROVIDER_SITE_OTHER): Payer: Self-pay

## 2012-08-10 ENCOUNTER — Telehealth (INDEPENDENT_AMBULATORY_CARE_PROVIDER_SITE_OTHER): Payer: Self-pay | Admitting: Family Medicine

## 2012-08-10 ENCOUNTER — Ambulatory Visit (INDEPENDENT_AMBULATORY_CARE_PROVIDER_SITE_OTHER): Payer: BLUE CROSS/BLUE SHIELD | Admitting: Family Medicine

## 2012-08-10 ENCOUNTER — Encounter (INDEPENDENT_AMBULATORY_CARE_PROVIDER_SITE_OTHER): Payer: Self-pay | Admitting: Family Medicine

## 2012-08-10 VITALS — BP 147/88 | HR 97 | Temp 98.2°F | Wt 255.2 lb

## 2012-08-10 DIAGNOSIS — J209 Acute bronchitis, unspecified: Secondary | ICD-10-CM

## 2012-08-10 DIAGNOSIS — I10 Essential (primary) hypertension: Secondary | ICD-10-CM

## 2012-08-10 DIAGNOSIS — J45901 Unspecified asthma with (acute) exacerbation: Secondary | ICD-10-CM

## 2012-08-10 DIAGNOSIS — J309 Allergic rhinitis, unspecified: Secondary | ICD-10-CM

## 2012-08-10 MED ORDER — LOSARTAN POTASSIUM 50 MG OR TABS
ORAL_TABLET | ORAL | Status: DC
Start: 2012-08-10 — End: 2013-02-26

## 2012-08-10 MED ORDER — BENZONATATE 200 MG OR CAPS
ORAL_CAPSULE | ORAL | Status: DC
Start: 2012-08-10 — End: 2012-09-01

## 2012-08-10 MED ORDER — HYDROCOD POLST-CPM POLST ER 10-8 MG/5ML OR SUER
ORAL | Status: DC
Start: 2012-08-10 — End: 2012-09-01

## 2012-08-10 NOTE — Telephone Encounter (Signed)
FMLA Forms completed at visit. Forms faxed and placed in Urgent Scan pile.

## 2012-08-10 NOTE — Progress Notes (Signed)
Anthony Andrade is a 47 year old male.    Chief Complaint   Patient presents with   . Follow-Up      FU on Bronchitis   . Other     FMLA Paperwork       HPI:  1) Pt here for follow up on Asthma Flare and bronchitis  Has been on antibiotics and steroid    Pt on inhalers    Much improved    Less SOB, Less coughing  Energy improving    No more fevers    Has been off work, feeling ready to try to return.  Pt works at [employer] and is subjected to cold weather, moisture and sometimes fumes.    2) HTN  On 1/2 tab of Losartan 50 mg BP in 130's/80s  No symptoms - eg CP, SOB, Orthopnea, LE Edema, PND, Claudication, HA or other neuro symtpoms    The above HPI includes the following elements:  context, quality, severity, modifying factors and associated signs and symptoms    ROS:   Constitutional: Positive for mild fatigue   Eyes: Negative    Ears, Nose, Mouth, Throat: Positive for some congestion and runny nose.   Cardiovascular: Negative    Respiratory: As noted in HPI above   Gastrointestinal: Negative   Genitourinary: Negative   Musculoskeletal: Negative    Skin: Negative    Neurological: Negative    Psychiatric: Negative    Endocrine: Negative    Hematologic/Lymphatic: Negative   Allergic/Immunologic: Negative     Past Medical History   Diagnosis Date   . Ankylosing spondylitis    . Esophageal reflux    . Allergic rhinitis due to other allergen    . Unspecified sleep apnea    . Restless legs syndrome (RLS)      Past Surgical History   Procedure Laterality Date   . Cholecystectomy     . Unlisted procedure shoulder       left shoulder   . Esophagogastroduodenoscopy transoral diagnostic  2008     on prevacid   . Colonoscopy stoma dx w/wo collj spec spx  2008     normal per patient     Review of patient's allergies indicates:  Allergies   Allergen Reactions   . Morphine Nausea/Vomiting   . Penicillins Rash     Meds:   Outpatient Prescriptions Prior to Visit   Medication Sig Dispense Refill   . Adalimumab (HUMIRA) 40 MG/0.8ML  Subcutaneous Kit every other week       . Albuterol Sulfate HFA (PROAIR HFA) 108 (90 BASE) MCG/ACT Inhalation Aero Soln 2 puffs inhaled every 4 hours as needed for asthma; may use every 2 hours for attacks, or preventively before exercise  1 Inhaler  6   . Beclomethasone Dipropionate (QVAR) 40 MCG/ACT Inhalation Aero Soln Inhale 2 puff(s) by mouth every 12 hours  1 Inhaler  1   . Benzonatate (TESSALON PERLES) 100 MG Oral Cap Take 1 capsule by mouth 3 times per day as needed for cough  21 Cap  1   . Cetirizine HCl 10 MG Oral Tab 1 TABLET DAILY  1 Tab  0   . Clarithromycin 500 MG Oral Tab Take 1 tablet twice daily until gone  20 Tab  0   . DULoxetine HCl 30 MG Oral CAPSULE ENTERIC COATED PARTICLES Take 1 cap daily       . Esomeprazole Magnesium (NEXIUM OR) None Entered       . Fluticasone Propionate  50 MCG/ACT Nasal Suspension 1 puffs each nostril  twice daily for prevention/control of nasal/sinus congestion  1 Inhaler  3   . Hydrocod Polst-CPM Polst ER (TUSSIONEX PENNKINETIC ER) 10-8 MG/5ML Oral Liquid CR Take 1 teaspoonful every 12 hours as needed for cough - use mostly at night  60 mL  0   . Lorazepam 2 MG Oral Tab 1 TABLET TWICE DAILY       . Losartan Potassium 50 MG Oral Tab take 1/2 to 1 tablet by mouth once daily**Please follow-up at 07/20/12 appointment**  30 Tab  0   . PredniSONE 20 MG Oral Tab 3 tablets daily x 3 days then 2 tablets daily x 3 days then 1 tablet daily x 3 days then 1/2 tablet daily x 4 days  20 Tab  0   . Pregabalin (LYRICA) 25 MG Oral Cap 1 tablet daily    0   . TiZANidine HCl 4 MG Oral Tab 1 tablet every 8 hours as needed for pain  90 Tab  0     No facility-administered medications prior to visit.         No family history on file.    History     Social History   . Marital Status: Married     Spouse Name: N/A     Number of Children: N/A   . Years of Education: N/A     Occupational History   . Not on file.     Social History Main Topics   . Smoking status: Never Smoker    . Smokeless tobacco:  Not on file   . Alcohol Use: Not on file   . Drug Use: Not on file   . Sexually Active: Not on file     Other Topics Concern   . Not on file     Social History Narrative    Recently moved from [place].  Works for [employer].  One daughter.  Sister is an Charity fundraiser.         02/15/12    Lives with his wife.    Daughter is studious     Son has been diagnosed with mild asperger's syndrome.    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  and No acute distress  Affect*: Judgement/insight normal, Mood/affect normal, Orientation oriented to time, person and place and Recent/remote Memory normal     HEENT: Eyes clear, anicteric                   Tms and EAC nl                   Nasal mucosal pale pink congested scant clear discharge, no sinus tenderness                  O/P nl, MM moist      Neck: supple, no nodes, nl thyroid      Chest: Scattered wheezes and rhonci, good aeration, no dullness or CVAT     COR: RRR, no murmur, rub or gallop, rad pulses 2+      Abd: benign, soft, NT, no HSM or masses      Ext: w/d, no  edema      Skin: no rashes     A/P    (466.0) Bronchitis, acute, with bronchospasm  (primary encounter diagnosis)  Plan: Benzonatate 200 MG Oral Cap, Hydrocod Polst-CPM        Polst ER (TUSSIONEX PENNKINETIC ER) 10-8 MG/5ML        Oral Liquid CR    (493.92) Asthma flare  Plan: Benzonatate 200 MG Oral Cap, Hydrocod Polst-CPM        Polst ER (TUSSIONEX PENNKINETIC ER) 10-8 MG/5ML        Oral Liquid CR    (477.9) ALLERGIC RHINITIS NOS  Plan: Benzonatate 200 MG Oral Cap, Hydrocod Polst-CPM        Polst ER (TUSSIONEX PENNKINETIC ER) 10-8 MG/5ML        Oral Liquid CR    (401.9) Hypertension  Comment: 130's/80's with 1/2 tab, no symptoms  Plan: Losartan Potassium 50 MG Oral Tab - increase to full tab           Reviewed HTN pt insructions        MEDICAL DECISION MAKING:  # of diagnoses or management options: multiple (c)  Amount and/or complexity of data to be reviewed: limited (b)  Risk of complications and/or morbidity or mortality: moderate (c)    F/u: 2 weeks

## 2012-08-10 NOTE — Patient Instructions (Signed)
It was a pleasure to see you in clinic today.                  If you are not yet signed up for eCare, please speak with a team member at the front desk who would happy to assist you with this process.      eCare  enrollment will allow you access to the below benefits   You can make appointments online   View test results / Lab Results   Obtain a copy of our After Visit Summary (a summary of your visit today)     Your Test Results:  If labs were ordered today the results are expected to be available via eCare in about 5 days. If you have an active eCare account, this is how we will notify you of your results.     If you do not have an eCare account then your test results will be mailed to you within about 14 days after your tests are completed. If your physician needs to change your care based on your results or is concerned, you will receive a phone call.     If you have any questions about your test results please schedule an appointment with your provider.    **If it has been more than 2 weeks and you have not received your test results please send our office a message via eCare.    Medication Refills: If you need a prescription refilled, please contact your pharmacy 1 week before your current supply will run out to request the refill.  Contacting your pharmacy is the fastest and safest way to obtain a medication refill.  The pharmacy will notify our office.  Please note, that a minimum of 48 to 72 hours is needed to refill a medication, but refills are usually processed and sent to your pharmacy in about 5 business days.  Please call your pharmacy early to allow enough time to refill before you anticipate running out.  For faster medication refills, you can also schedule an appointment with your provider.    We know you have a choice in where you receive your healthcare and we sincerely thank you for trusting  [facility] with your health.

## 2012-08-11 NOTE — Telephone Encounter (Signed)
Pt off work 07/30/12 to 08/13/12  Completed Short term disability form    Please fax back and scan

## 2012-08-12 ENCOUNTER — Telehealth (INDEPENDENT_AMBULATORY_CARE_PROVIDER_SITE_OTHER): Payer: Self-pay | Admitting: Family

## 2012-08-12 NOTE — Telephone Encounter (Signed)
Routing to team pool to see if disability paperwork was recieved

## 2012-08-12 NOTE — Telephone Encounter (Signed)
Received the completed [employer] form from Dr. Dareen Piano. A copy of this form has been placed in the Urgent Scan pile.  Form faxed back to [facility]  at  fax #[phone]     phone # [phone] Opt. 4.

## 2012-08-12 NOTE — Telephone Encounter (Signed)
Received the completed [employer] Disability  form from Dr. Dareen Piano. A copy of this form has been placed in the Urgent Scan pile.  Form faxed back to [facility] at  fax # [phone] phone # [phone].

## 2012-08-12 NOTE — Telephone Encounter (Signed)
CONFIRMED PHONE NUMBER: [phone] option [phone]   CALLERS FIRST AND LAST NAME: Anthony Andrade   FACILITY NAME: [employer] TITLE: na  CALLERS RELATIONSHIP:OTHER: na  RETURN CALL: General message OK    SUBJECT: General Message   REASON FOR REQUEST: Caller states that they faxed a disability form on 08/07/12, they would like to know the status of the form.  Caller needs to have the form by Friday 08/14/12.      MESSAGE: Requesting a call from the clinic, claim number [id number]

## 2012-09-01 ENCOUNTER — Encounter (INDEPENDENT_AMBULATORY_CARE_PROVIDER_SITE_OTHER): Payer: Self-pay | Admitting: Family Medicine

## 2012-09-01 ENCOUNTER — Ambulatory Visit (INDEPENDENT_AMBULATORY_CARE_PROVIDER_SITE_OTHER): Payer: BLUE CROSS/BLUE SHIELD | Admitting: Family Medicine

## 2012-09-01 VITALS — BP 137/90 | HR 99 | Wt 254.4 lb

## 2012-09-01 DIAGNOSIS — M459 Ankylosing spondylitis of unspecified sites in spine: Secondary | ICD-10-CM

## 2012-09-01 DIAGNOSIS — J209 Acute bronchitis, unspecified: Secondary | ICD-10-CM

## 2012-09-01 DIAGNOSIS — G8929 Other chronic pain: Secondary | ICD-10-CM

## 2012-09-01 NOTE — Progress Notes (Signed)
Anthony Andrade is a 47 year old male.    Chief Complaint   Patient presents with   . Cough     FU on Bronchitis       HPI:  1) here for follow up on bronchitis with bronchospasm    Much improved  Cough resolved  Breathing well  Weaned off inhalers      2) Ankylosing Spondylitis  Pt saw Dr Marlyn Corporal at [facility]  Had seen Dr Diona Browner in the past    On Humira, instead of Remicade  Was also on Sulfasalazine, Indomethicin and Celebrex - pt self d/c'd these meds last summer  Worse again this winter - usually worse in winter  This winter used Indomethicin prn    Yesterday work up - " miserable", pain worse    Wants a Rheumatologist who will try to work with him to try to reduce meds and who may be open to alternative/dietary treatment    The above HPI includes the following elements:  context, quality, severity, modifying factors and associated signs and symptoms        Past Medical History   Diagnosis Date   . Ankylosing spondylitis    . Esophageal reflux    . Allergic rhinitis due to other allergen    . Unspecified sleep apnea    . Restless legs syndrome (RLS)      Review of patient's allergies indicates:  Allergies   Allergen Reactions   . Morphine Nausea/Vomiting   . Penicillins Rash     Meds:   Outpatient Prescriptions Prior to Visit   Medication Sig Dispense Refill   . Adalimumab (HUMIRA) 40 MG/0.8ML Subcutaneous Kit every other week       . Albuterol Sulfate HFA (PROAIR HFA) 108 (90 BASE) MCG/ACT Inhalation Aero Soln 2 puffs inhaled every 4 hours as needed for asthma; may use every 2 hours for attacks, or preventively before exercise  1 Inhaler  6   . Beclomethasone Dipropionate (QVAR) 40 MCG/ACT Inhalation Aero Soln Inhale 2 puff(s) by mouth every 12 hours  1 Inhaler  1   . Benzonatate 200 MG Oral Cap 1 po q 6-8 hours, NTE 3 per day  21 Cap  1   . Cetirizine HCl 10 MG Oral Tab 1 TABLET DAILY  1 Tab  0   . DULoxetine HCl 30 MG Oral CAPSULE ENTERIC COATED PARTICLES Take 1 cap daily       .  Esomeprazole Magnesium (NEXIUM OR) None Entered       . Fluticasone Propionate 50 MCG/ACT Nasal Suspension 1 puffs each nostril  twice daily for prevention/control of nasal/sinus congestion  1 Inhaler  3   . Hydrocod Polst-CPM Polst ER (TUSSIONEX PENNKINETIC ER) 10-8 MG/5ML Oral Liquid CR Take 1 teaspoonful every 12 hours as needed for cough - use mostly at night  60 mL  0   . Lorazepam 2 MG Oral Tab 1 TABLET TWICE DAILY       . Losartan Potassium 50 MG Oral Tab 1 po qd  30 Tab  5   . Pregabalin (LYRICA) 25 MG Oral Cap 1 tablet daily    0   . TiZANidine HCl 4 MG Oral Tab 1 tablet every 8 hours as needed for pain  90 Tab  0     No facility-administered medications prior to visit.         No family history on file.    History     Social History   .  Marital Status: Married     Spouse Name: N/A     Number of Children: N/A   . Years of Education: N/A     Occupational History   . Not on file.     Social History Main Topics   . Smoking status: Never Smoker    . Smokeless tobacco: Not on file   . Alcohol Use: Not on file   . Drug Use: Not on file   . Sexually Active: Not on file     Other Topics Concern   . Not on file     Social History Narrative    Recently moved from [place].  Works for [employer].  One daughter.  Sister is an Charity fundraiser.         02/15/12    Lives with his wife.    Daughter is studious     Son has been diagnosed with mild asperger's syndrome.    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  and No acute distress  Affect*: Judgement/insight normal, Mood/affect normal, Orientation oriented to time, person and place and Recent/remote Memory normal     HEENT: Eyes clear, anicteric                   Tms and EAC nl                   Nasal mucosal nl                   O/P nl, MM moist      Neck: supple, no nodes, nl thyroid       Chest: CTA and P, no CVAT      COR: RRR, no murmur, rub or gallop, rad pulses 2+      Abd: benign, soft, NT, no HSM or masses      Ext: w/d, no edema      Skin: no rashes     A/P    (466.0) Bronchitis, acute, with bronchospasm  (primary encounter diagnosis)  Comment: resolved  Plan: albuterol prn    (720.0) Ankylosing spondylitis  Plan: discussed Rheumatology options    (338.29) Chronic pain  Plan: continue current meds        MEDICAL DECISION MAKING:  # of diagnoses or management options: limited (b)  Amount and/or complexity of data to be reviewed: limited (b)  Risk of complications and/or morbidity or mortality: low (b)    F/u: prn

## 2012-09-01 NOTE — Patient Instructions (Signed)
It was a pleasure to see you in clinic today.                  If you are not yet signed up for eCare, please speak with a team member at the front desk who would happy to assist you with this process.      eCare  enrollment will allow you access to the below benefits   You can make appointments online   View test results / Lab Results   Obtain a copy of our After Visit Summary (a summary of your visit today)     Your Test Results:  If labs were ordered today the results are expected to be available via eCare in about 5 days. If you have an active eCare account, this is how we will notify you of your results.     If you do not have an eCare account then your test results will be mailed to you within about 14 days after your tests are completed. If your physician needs to change your care based on your results or is concerned, you will receive a phone call.     If you have any questions about your test results please schedule an appointment with your provider.    **If it has been more than 2 weeks and you have not received your test results please send our office a message via eCare.    Medication Refills: If you need a prescription refilled, please contact your pharmacy 1 week before your current supply will run out to request the refill.  Contacting your pharmacy is the fastest and safest way to obtain a medication refill.  The pharmacy will notify our office.  Please note, that a minimum of 48 to 72 hours is needed to refill a medication, but refills are usually processed and sent to your pharmacy in about 5 business days.  Please call your pharmacy early to allow enough time to refill before you anticipate running out.  For faster medication refills, you can also schedule an appointment with your provider.    We know you have a choice in where you receive your healthcare and we sincerely thank you for trusting [facility] with your health.

## 2012-09-07 ENCOUNTER — Ambulatory Visit (INDEPENDENT_AMBULATORY_CARE_PROVIDER_SITE_OTHER): Payer: BLUE CROSS/BLUE SHIELD | Admitting: Family Medicine

## 2012-09-10 ENCOUNTER — Other Ambulatory Visit (INDEPENDENT_AMBULATORY_CARE_PROVIDER_SITE_OTHER): Payer: Self-pay | Admitting: Family Medicine

## 2012-09-10 ENCOUNTER — Encounter (INDEPENDENT_AMBULATORY_CARE_PROVIDER_SITE_OTHER): Payer: Self-pay | Admitting: Family

## 2012-09-10 ENCOUNTER — Ambulatory Visit (INDEPENDENT_AMBULATORY_CARE_PROVIDER_SITE_OTHER): Payer: BLUE CROSS/BLUE SHIELD | Admitting: Family

## 2012-09-10 VITALS — BP 145/85 | HR 96 | Temp 98.4°F | Resp 12 | Wt 255.0 lb

## 2012-09-10 DIAGNOSIS — E039 Hypothyroidism, unspecified: Secondary | ICD-10-CM

## 2012-09-10 DIAGNOSIS — M459 Ankylosing spondylitis of unspecified sites in spine: Secondary | ICD-10-CM

## 2012-09-10 DIAGNOSIS — H6980 Other specified disorders of Eustachian tube, unspecified ear: Secondary | ICD-10-CM

## 2012-09-10 DIAGNOSIS — M2550 Pain in unspecified joint: Secondary | ICD-10-CM

## 2012-09-10 LAB — CBC, DIFF
% Basophils: 0 %
% Eosinophils: 1 %
% Immature Granulocytes: 0 %
% Lymphocytes: 20 %
% Monocytes: 6 %
% Neutrophils: 73 %
Absolute Eosinophil Count: 0.05 10*3/uL (ref 0.00–0.50)
Absolute Lymphocyte Count: 1.92 10*3/uL (ref 1.00–4.80)
Basophils: 0.04 10*3/uL (ref 0.00–0.20)
Hematocrit: 43 % (ref 38–50)
Hemoglobin: 15.2 g/dL (ref 13.0–18.0)
Immature Granulocytes: 0.04 10*3/uL (ref 0.00–0.05)
MCH: 29.6 pg (ref 27.3–33.6)
MCHC: 35.2 g/dL (ref 32.2–36.5)
MCV: 84 fL (ref 81–98)
Monocytes: 0.57 10*3/uL (ref 0.00–0.80)
Neutrophils: 6.8 10*3/uL (ref 1.80–7.00)
Platelet Count: 195 10*3/uL (ref 150–400)
RBC: 5.14 mil/uL (ref 4.40–5.60)
RDW-CV: 13.2 % (ref 11.6–14.4)
WBC: 9.42 10*3/uL (ref 4.3–10.0)

## 2012-09-10 LAB — THYROID STIMULATING HORMONE: Thyroid Stimulating Hormone: 2.251 u[IU]/mL (ref 0.400–5.000)

## 2012-09-10 LAB — T4, FREE: Thyroxine (Free): 0.9 ng/dL (ref 0.6–1.2)

## 2012-09-10 NOTE — Progress Notes (Signed)
S: URI for one week with eustachian tubes blocked.  Sinuses are clogged. Ears are popping.  Some sore throat.  He is starting to cough.  Feels like it is drainage but did have severe bronchitis February.   He also has allergies that are seasonal.  No fever, no chest pain, some sneezing, mild sore throat.      S: Aching all over; Since weather is changing he is back on Humira.  Stopped his thyroid medication around 3/11 because he thought he was normal and did not need it. He feels sluggish and isn't sure what to do. Denies chest pain or dizziness. Appetite is about the same. Has gained weight even on a very green nutrition plan. Going to work but was ill this week.    O: Rechecked blood pressure 140/88 right arm.   HEENT: Thyroid non tender, no nodules palpated  Effusions in both ears but no signs of infection  Sinus tenderness over maxillary region and frontal region.   No pain with   Right cervical lymph node tender but not enlarged.  Mucous noted in back of throat   Chest: Lungs clear, no wheezing       (244.9) Hypothyroid  (primary encounter diagnosis)  Plan: THYROID STIMULATING HORMONE, T4, FREE  Likely needs to restart meds. Discussed life long condition     (381.81) Eustachian tube dysfunction  Plan: Allegra daily, flonase daily, steam and saline rinses    (720.0) Ankylosing spondylitis  Plan: CBC, DIFF    Rechecking immunity per risk    (719.40) Joint pain  Plan: VITAMIN D (25 HYDROXY)       Rule out low vitamin D    See on 1-2 weeks

## 2012-09-10 NOTE — Patient Instructions (Addendum)
1. Wait for blood work  2. Start Allegra NOW with flonase 2 puffs daily for at least one week  3. Saline rinses  4. Drainage at night can be improved with a night benadryl if needed but you probably dont' if on Allegra. Use Mucinex instead at night.

## 2012-09-11 DIAGNOSIS — E039 Hypothyroidism, unspecified: Secondary | ICD-10-CM

## 2012-09-11 HISTORY — DX: Hypothyroidism, unspecified: E03.9

## 2012-09-11 MED ORDER — LEVOTHYROXINE SODIUM 75 MCG OR TABS
ORAL_TABLET | ORAL | Status: DC
Start: 2012-09-10 — End: 2013-01-05

## 2012-09-11 NOTE — Telephone Encounter (Signed)
Ok to restart med  If you haVE been off med for a while  Would restart, then repeat TSH after 6-8 weeks

## 2012-09-11 NOTE — Telephone Encounter (Signed)
Will check with Dr. Dareen Piano and Nash Dimmer.

## 2012-09-11 NOTE — Telephone Encounter (Signed)
Levothyroxine discontinued by patient from med list on 08/04/12. Its last fill was 04/24/12.  Re-start?

## 2012-09-12 LAB — VITAMIN D (25 HYDROXY)
Vit D (25_Hydroxy) Total: 28.6 ng/mL (ref 20.1–50.0)
Vitamin D2 (25_Hydroxy): 1.5 ng/mL
Vitamin D3 (25_Hydroxy): 27.1 ng/mL

## 2012-09-14 ENCOUNTER — Encounter (INDEPENDENT_AMBULATORY_CARE_PROVIDER_SITE_OTHER): Payer: Self-pay | Admitting: Family

## 2012-09-16 ENCOUNTER — Encounter (INDEPENDENT_AMBULATORY_CARE_PROVIDER_SITE_OTHER): Payer: Self-pay | Admitting: Family

## 2012-09-16 ENCOUNTER — Ambulatory Visit (INDEPENDENT_AMBULATORY_CARE_PROVIDER_SITE_OTHER): Payer: BLUE CROSS/BLUE SHIELD | Admitting: Family

## 2012-09-16 NOTE — Telephone Encounter (Signed)
Routing to provider- see eCare message

## 2012-09-16 NOTE — Telephone Encounter (Signed)
Please see Ecare message.

## 2012-09-16 NOTE — Telephone Encounter (Signed)
Letter rewritten.

## 2012-09-17 ENCOUNTER — Ambulatory Visit (INDEPENDENT_AMBULATORY_CARE_PROVIDER_SITE_OTHER): Payer: BLUE CROSS/BLUE SHIELD | Admitting: Family

## 2012-09-18 ENCOUNTER — Ambulatory Visit (INDEPENDENT_AMBULATORY_CARE_PROVIDER_SITE_OTHER): Payer: BLUE CROSS/BLUE SHIELD | Admitting: Family

## 2012-09-18 ENCOUNTER — Encounter (INDEPENDENT_AMBULATORY_CARE_PROVIDER_SITE_OTHER): Payer: Self-pay | Admitting: Family

## 2012-09-18 VITALS — BP 150/93 | HR 93 | Temp 98.4°F | Resp 12 | Wt 250.0 lb

## 2012-09-18 DIAGNOSIS — M459 Ankylosing spondylitis of unspecified sites in spine: Secondary | ICD-10-CM

## 2012-09-18 MED ORDER — CYCLOBENZAPRINE HCL 10 MG OR TABS
ORAL_TABLET | ORAL | Status: DC
Start: 2012-09-18 — End: 2013-11-12

## 2012-09-18 MED ORDER — HYDROCODONE-ACETAMINOPHEN 10-325 MG OR TABS
ORAL_TABLET | ORAL | Status: DC
Start: 2012-09-18 — End: 2012-12-03

## 2012-09-18 NOTE — Patient Instructions (Signed)
It was a pleasure to see you in clinic today.                  If you are not yet signed up for eCare, please speak with a team member at the front desk who would happy to assist you with this process.      eCare  enrollment will allow you access to the below benefits   You can make appointments online   View test results / Lab Results   Obtain a copy of our After Visit Summary (a summary of your visit today)     Your Test Results:  If labs were ordered today the results are expected to be available via eCare in about 5 days. If you have an active eCare account, this is how we will notify you of your results.     If you do not have an eCare account then your test results will be mailed to you within about 14 days after your tests are completed. If your physician needs to change your care based on your results or is concerned, you will receive a phone call.     If you have any questions about your test results please schedule an appointment with your provider.    **If it has been more than 2 weeks and you have not received your test results please send our office a message via eCare.    Medication Refills: If you need a prescription refilled, please contact your pharmacy 1 week before your current supply will run out to request the refill.  Contacting your pharmacy is the fastest and safest way to obtain a medication refill.  The pharmacy will notify our office.  Please note, that a minimum of 48 to 72 hours is needed to refill a medication, but refills are usually processed and sent to your pharmacy in about 5 business days.  Please call your pharmacy early to allow enough time to refill before you anticipate running out.  For faster medication refills, you can also schedule an appointment with your provider.    We know you have a choice in where you receive your healthcare and we sincerely thank you for trusting [facility] with your health.

## 2012-09-19 NOTE — Progress Notes (Signed)
S: Back pain has become severe with exacerbation of Ankylosing Spondylitis in last few days. Weather change has been unkind. He has tried hot showers, stretching and is taking his medications from his rheumatologist. Can't sleep and needs to go back to work. Would like something to ease the pain off hours and to sleep.  Has not had narcotics for more than 1 year. Just wants short term till he sees Rheumatologist in 10 days.    Pain is 7/10 along spine. No numbness or tingling in lower extremities. Gait is disturbed by the pain and he is limping. He denies difficulty with balance. Cant sit for more than a few minutes. Pain does not radiate.     ROS:     Neurological: Negative    Psychiatric: Negative     OBJECTIVES:    Constitutional: Positive for pain and appears uncomfortable. B/P elevated  Back exam:  -- Inspection: no deformity, shoulder asymmetry present ;  negative bruising, negative atrophy, positive paraspinous muscle spasm along the C/T/L/S spine  -- ROM: Dorsolumbar flexion to 20 degrees. Extension to 45 degrees. Lateral tilt to 45 degrees.  -- Palpation: Positive  tenderness along the spinous processes of the thoracic T1-T8. Positive paraspinous muscle spasm noted.  -- Neurovascular exam: Sensation intact. Distal pulses intact.    (720.0) Ankylosing spondylitis  (primary encounter diagnosis)  Plan: Hydrocodone-Acetaminophen 10-325 MG Oral Tab,         Cyclobenzaprine HCl (FLEXERIL) 10 MG Oral Tab     See Rheumatologist as soon as possible  Continue NSAIDS with food    F/u if not improved  Agreed to complete his paper work for pick up on Monday

## 2012-09-21 ENCOUNTER — Telehealth (INDEPENDENT_AMBULATORY_CARE_PROVIDER_SITE_OTHER): Payer: Self-pay | Admitting: Family

## 2012-09-21 NOTE — Telephone Encounter (Signed)
CONFIRMED PHONE NUMBER: [phone]  CALLERS FIRST AND LAST NAME: Larita Fife  FACILITY NAME: [facility] TITLE: Disability  CALLERS RELATIONSHIP:OTHER: Insurance  RETURN CALL: OK to leave detailed message with anyone that answers     SUBJECT: General Message   REASON FOR REQUEST: Additional form    MESSAGE: [employer] disability needs an additional form - Attending Physician's Statement, that includes the dates of disability (if the doctor disabled him), treatment dates associated with this issue, and if the patient had surgery or was hospitalized.  Please include patient ID [id number].  Please call with any questions.

## 2012-09-21 NOTE — Telephone Encounter (Signed)
Relayed reqeust to clam number and diagnosis code 720.0 GIVEN overphone and notified to contact clinic if any further questions

## 2012-09-21 NOTE — Telephone Encounter (Signed)
Faxed paper work with confirmation, placed at front desk for pt pick up. Leaving TE open a few days for any f/u.

## 2012-09-21 NOTE — Telephone Encounter (Signed)
CONFIRMED PHONE NUMBER: [phone] OPTION 4  CALLERS FIRST AND LAST NAME: Baxter Hire  FACILITY NAME: [employer] TITLE: Intake Analyst  CALLERS RELATIONSHIP:OTHER:   RETURN CALL: OK to leave detailed message with anyone that answers     SUBJECT: General Message   REASON FOR REQUEST:     MESSAGE: [employer] Disability is needing an ICD 9 code for ankylosing spondylitis. When calling, please use CLAIM #:  [id number]. Thank you

## 2012-09-22 NOTE — Telephone Encounter (Signed)
[employer] and asked that Physician's Statement form be faxed. Rep states it will be faxed today.  Routing to team pool to locate fax.

## 2012-09-23 NOTE — Telephone Encounter (Signed)
We have received paper work - placed in [employer] in box

## 2012-09-23 NOTE — Telephone Encounter (Signed)
Jeri Modena from [place] called to see if fax was received by clinic. Requesting call back to number listed below (option 4), with claim ID [id number].    Please assist. Thanks

## 2012-09-24 NOTE — Telephone Encounter (Signed)
Appears paperwork hs been pick up. There is nothing for this pt in the either file cabinet up front. Closing TE

## 2012-09-26 ENCOUNTER — Telehealth (INDEPENDENT_AMBULATORY_CARE_PROVIDER_SITE_OTHER): Payer: Self-pay | Admitting: Family

## 2012-09-26 NOTE — Telephone Encounter (Signed)
Patient has another form that was completed for [employer]. Please prepare so he can pick it up at the [employer].     Patient's wife ask that we fax the form to [facility] which is [phone].  BOTH PAGES must be faxed.    It is in my OUT/MA slot and is an Attending Physician Statement.    Thank you

## 2012-09-28 NOTE — Telephone Encounter (Signed)
Routing to team pool to fax forms.

## 2012-09-28 NOTE — Telephone Encounter (Signed)
Faxed to [facility] [phone] - patient notified ready to pick up

## 2012-09-28 NOTE — Telephone Encounter (Signed)
Patient in clinic, asked for a copy of completed form, copy made, form put back in [facility] OUT/MA box ready to be faxed.

## 2012-10-12 ENCOUNTER — Encounter (INDEPENDENT_AMBULATORY_CARE_PROVIDER_SITE_OTHER): Payer: Self-pay

## 2012-11-27 ENCOUNTER — Encounter (INDEPENDENT_AMBULATORY_CARE_PROVIDER_SITE_OTHER): Payer: Self-pay | Admitting: Family

## 2012-11-27 NOTE — Progress Notes (Signed)
Date Received: 11/27/2012    Request Sent to: [facility]    Request was sent via:Fax    Contact for follow up:faxed to [phone]

## 2012-12-01 ENCOUNTER — Encounter (INDEPENDENT_AMBULATORY_CARE_PROVIDER_SITE_OTHER): Payer: Self-pay | Admitting: Family Medicine

## 2012-12-03 ENCOUNTER — Other Ambulatory Visit (INDEPENDENT_AMBULATORY_CARE_PROVIDER_SITE_OTHER): Payer: Self-pay | Admitting: Family

## 2012-12-03 DIAGNOSIS — M47819 Spondylosis without myelopathy or radiculopathy, site unspecified: Secondary | ICD-10-CM

## 2012-12-04 MED ORDER — HYDROCODONE-ACETAMINOPHEN 10-325 MG OR TABS
ORAL_TABLET | ORAL | Status: DC
Start: 2012-12-03 — End: 2013-02-15

## 2012-12-04 NOTE — Telephone Encounter (Signed)
Your patient has requested:  Drug: generic Norco 10/325  Directions: 1-2 Q6H prn  Qty:100  Last filled: 09/23/12  Pharmacy: [facility]       If the prescription is approved please make sure the signed hard copy is faxed to the requesting pharmacy or called in.   If it is not approved please let the patient & requesting pharmacy know.

## 2012-12-11 NOTE — Telephone Encounter (Signed)
RX faxed

## 2013-01-05 ENCOUNTER — Other Ambulatory Visit (INDEPENDENT_AMBULATORY_CARE_PROVIDER_SITE_OTHER): Payer: Self-pay | Admitting: Family Medicine

## 2013-01-05 DIAGNOSIS — E039 Hypothyroidism, unspecified: Secondary | ICD-10-CM

## 2013-01-06 MED ORDER — LEVOTHYROXINE SODIUM 75 MCG OR TABS
ORAL_TABLET | ORAL | Status: DC
Start: 2013-01-05 — End: 2013-02-15

## 2013-01-06 NOTE — Telephone Encounter (Signed)
Last seen/TFTs March 2014.  Letter sent 12/01/12 that overdue for thyroid test (orders still pending).  Will route to clinical pool to call patient to have labs done.    NOTE: levothyroxine #30 last filled 12/03/12.

## 2013-01-07 NOTE — Telephone Encounter (Signed)
Called and left message for Pt to call back in to let him know he is due for labs.   Routing to Provider to see if they want to fill Rx without labs being complete.

## 2013-01-07 NOTE — Telephone Encounter (Signed)
Lab appointment and f/u scheduled

## 2013-01-08 ENCOUNTER — Other Ambulatory Visit (INDEPENDENT_AMBULATORY_CARE_PROVIDER_SITE_OTHER): Payer: BLUE CROSS/BLUE SHIELD

## 2013-01-13 ENCOUNTER — Encounter (INDEPENDENT_AMBULATORY_CARE_PROVIDER_SITE_OTHER): Payer: BLUE CROSS/BLUE SHIELD | Admitting: Family

## 2013-02-15 ENCOUNTER — Other Ambulatory Visit (INDEPENDENT_AMBULATORY_CARE_PROVIDER_SITE_OTHER): Payer: Self-pay | Admitting: Family

## 2013-02-15 ENCOUNTER — Other Ambulatory Visit (INDEPENDENT_AMBULATORY_CARE_PROVIDER_SITE_OTHER): Payer: Self-pay | Admitting: Family Medicine

## 2013-02-15 DIAGNOSIS — M459 Ankylosing spondylitis of unspecified sites in spine: Secondary | ICD-10-CM

## 2013-02-15 DIAGNOSIS — E039 Hypothyroidism, unspecified: Secondary | ICD-10-CM

## 2013-02-16 MED ORDER — LEVOTHYROXINE SODIUM 75 MCG OR TABS
ORAL_TABLET | ORAL | Status: DC
Start: 2013-02-15 — End: 2013-02-26

## 2013-02-16 NOTE — Telephone Encounter (Signed)
Your patient has requested:  Drug: generic Norco 10/325  Directions: 1-2 Q6 prn  Qty:100  Last filled: 12/11/12  Pharmacy: [facility]     If the prescription is approved please make sure the signed hard copy is faxed to the requesting pharmacy or called in.   If it is not approved please let the patient & requesting pharmacy know.

## 2013-02-17 MED ORDER — HYDROCODONE-ACETAMINOPHEN 10-325 MG OR TABS
ORAL_TABLET | ORAL | Status: DC
Start: 2013-02-15 — End: 2013-10-13

## 2013-02-17 NOTE — Telephone Encounter (Signed)
Please let patient know he needs fasting blood work before next visit which I would like for him to schedule within 30 days.   I have provided 1 month supply of his norco.  He will need to pick up the prescription. He needs a follow up appointment for chronic pain every 3 months and we can discuss his blood work.

## 2013-02-17 NOTE — Telephone Encounter (Signed)
Rx left at [facility] for patient to pick up.  To [facility] to relay further message from [place].

## 2013-02-17 NOTE — Telephone Encounter (Signed)
Called and left message for Pt to call back in to review below.   Will forward to try Pt again later today.     .CCR:  If patient returns phone call, please transfer call to an [place] medical assistant at extension [phone]. If no one is available to answer, please take a message and forward the TE to the [place] Clinical Staff Pool.

## 2013-02-18 ENCOUNTER — Telehealth (INDEPENDENT_AMBULATORY_CARE_PROVIDER_SITE_OTHER): Payer: Self-pay | Admitting: Family

## 2013-02-18 NOTE — Telephone Encounter (Signed)
Called and left message for Pt to call back in to review below.   Will forward to try Pt again later today.     .CCR:  If patient returns phone call, please transfer call to an [place] medical assistant at extension [phone]. If no one is available to answer, please take a message and forward the TE to the [place] Clinical Staff Pool.

## 2013-02-18 NOTE — Telephone Encounter (Signed)
Left message for patient to call clinic.  Please relay Anthony Andrade's request for an appointment and labs.

## 2013-02-18 NOTE — Telephone Encounter (Signed)
Called and left message for Pt to call back in to review below. 3rd Attempt.     Letter mailed.     Closing TE.

## 2013-02-20 ENCOUNTER — Other Ambulatory Visit (INDEPENDENT_AMBULATORY_CARE_PROVIDER_SITE_OTHER): Payer: BLUE CROSS/BLUE SHIELD

## 2013-02-20 DIAGNOSIS — E039 Hypothyroidism, unspecified: Secondary | ICD-10-CM

## 2013-02-20 LAB — THYROID STIMULATING HORMONE: Thyroid Stimulating Hormone: 1.596 u[IU]/mL (ref 0.400–5.000)

## 2013-02-26 ENCOUNTER — Ambulatory Visit (INDEPENDENT_AMBULATORY_CARE_PROVIDER_SITE_OTHER): Payer: BLUE CROSS/BLUE SHIELD | Admitting: Family

## 2013-02-26 ENCOUNTER — Encounter (INDEPENDENT_AMBULATORY_CARE_PROVIDER_SITE_OTHER): Payer: Self-pay | Admitting: Family

## 2013-02-26 VITALS — BP 142/91 | HR 114 | Temp 99.2°F | Resp 18 | Wt 256.0 lb

## 2013-02-26 DIAGNOSIS — R200 Anesthesia of skin: Secondary | ICD-10-CM

## 2013-02-26 DIAGNOSIS — E039 Hypothyroidism, unspecified: Secondary | ICD-10-CM

## 2013-02-26 DIAGNOSIS — M459 Ankylosing spondylitis of unspecified sites in spine: Secondary | ICD-10-CM

## 2013-02-26 DIAGNOSIS — K219 Gastro-esophageal reflux disease without esophagitis: Secondary | ICD-10-CM

## 2013-02-26 DIAGNOSIS — R209 Unspecified disturbances of skin sensation: Secondary | ICD-10-CM

## 2013-02-26 DIAGNOSIS — I10 Essential (primary) hypertension: Secondary | ICD-10-CM

## 2013-02-26 DIAGNOSIS — R700 Elevated erythrocyte sedimentation rate: Secondary | ICD-10-CM

## 2013-02-26 LAB — COMPREHENSIVE METABOLIC PANEL
ALT (GPT): 51 U/L (ref 10–64)
AST (GOT): 35 U/L (ref 15–40)
Albumin: 4 g/dL (ref 3.5–5.2)
Alkaline Phosphatase (Total): 103 U/L (ref 39–139)
Anion Gap: 9 (ref 3–11)
Bilirubin (Total): 0.7 mg/dL (ref 0.2–1.3)
Calcium: 9.3 mg/dL (ref 8.9–10.2)
Carbon Dioxide, Total: 26 mEq/L (ref 22–32)
Chloride: 103 mEq/L (ref 98–108)
Creatinine: 0.95 mg/dL (ref 0.51–1.18)
GFR, Calc, African American: >60 mL/min (ref >59)
GFR, Calc, European American: >60 mL/min (ref >59)
Glucose: 175 mg/dL — ABNORMAL HIGH (ref 62–125)
Potassium: 3.9 mEq/L (ref 3.7–5.2)
Protein (Total): 6.5 g/dL (ref 6.0–8.2)
Sodium: 138 mEq/L (ref 136–145)
Urea Nitrogen: 8 mg/dL (ref 8–21)

## 2013-02-26 LAB — LIPID PANEL
Cholesterol/HDL Ratio: 5.9
HDL Cholesterol: 28 mg/dL — ABNORMAL LOW (ref >40)
Non-HDL Cholesterol: 138 mg/dL (ref 0–159)
Total Cholesterol: 166 mg/dL (ref <200)
Triglyceride: 695 mg/dL — ABNORMAL HIGH (ref <150)

## 2013-02-26 LAB — CRP WITH CARDIAC RISK
CRP Cardiac Risk Assessment: HIGH
C_Reactive Protein: 0.5 mg/L (ref 0.0–10.0)

## 2013-02-26 MED ORDER — LOSARTAN POTASSIUM 50 MG OR TABS
ORAL_TABLET | ORAL | Status: DC
Start: 2013-02-26 — End: 2013-10-14

## 2013-02-26 MED ORDER — LEVOTHYROXINE SODIUM 75 MCG OR TABS
75.0000 ug | ORAL_TABLET | Freq: Every day | ORAL | Status: DC
Start: 2013-02-26 — End: 2014-03-17

## 2013-02-26 MED ORDER — ESOMEPRAZOLE MAGNESIUM 40 MG OR CPDR
DELAYED_RELEASE_CAPSULE | ORAL | Status: DC
Start: 2013-02-26 — End: 2013-07-02

## 2013-02-26 NOTE — Patient Instructions (Signed)
It was a pleasure to see you in clinic today.                  If you are not yet signed up for eCare, please speak with a team member at the front desk who would happy to assist you with this process.      eCare  enrollment will allow you access to the below benefits   You can make appointments online   View test results / Lab Results   Obtain a copy of our After Visit Summary (a summary of your visit today)     Your Test Results:  If labs were ordered today the results are expected to be available via eCare in about 5 days. If you have an active eCare account, this is how we will notify you of your results.     If you do not have an eCare account then your test results will be mailed to you within about 14 days after your tests are completed. If your physician needs to change your care based on your results or is concerned, you will receive a phone call.     If you have any questions about your test results please schedule an appointment with your provider.    **If it has been more than 2 weeks and you have not received your test results please send our office a message via eCare.    Medication Refills: If you need a prescription refilled, please contact your pharmacy 1 week before your current supply will run out to request the refill.  Contacting your pharmacy is the fastest and safest way to obtain a medication refill.  The pharmacy will notify our office.  Please note, that a minimum of 48 to 72 hours is needed to refill a medication, but refills are usually processed and sent to your pharmacy in about 5 business days.  Please call your pharmacy early to allow enough time to refill before you anticipate running out.  For faster medication refills, you can also schedule an appointment with your provider.    We know you have a choice in where you receive your healthcare and we sincerely thank you for trusting [facility] with your health.

## 2013-02-26 NOTE — Progress Notes (Signed)
Anthony Andrade is  here with chief complaint:    #1 Hypothyroid   Duration noted in 2012  Quality   Timing:   Severity:   Context: Medication on board at 75 mcg daily  Modifying factors: Patient had thyroid level drawn yesterday    #2: GERD/ Nexium  Duration Several years.   Quality stable on medication; told he needs surgery but avoiding as he doesn't like to be scoped  Timing: worse in afternoon and sometimes at night  Severity: can be a 4-5  Context: Not interested in surgery; doing well on nexium works for him.  Modifying factors:       #3: High Blood pressure  Not checking at home; no headaches; he is having numbness and some tingling; sometimes his left foot will go numb up to his knee. Sometimes lasts the entire night.  No chest pain; no swelling in the ankles he is aware of. However his AS has been not good.    Here for refill.    Past Medical History   Diagnosis Date   . Ankylosing spondylitis    . Esophageal reflux    . Allergic rhinitis due to other allergen    . Unspecified sleep apnea    . Restless legs syndrome (RLS)      Past Surgical History   Procedure Laterality Date   . Cholecystectomy     . Unlisted procedure shoulder       left shoulder   . Esophagogastroduodenoscopy transoral diagnostic  2008     on prevacid   . Colonoscopy stoma dx w/wo collj spec spx  2008     normal per patient       Patient Active Problem List   Diagnosis   . DEPRESSIVE DISORDER NEC   . ALLERGIC RHINITIS NOS   . ANKYLOSING SPONDYLITIS   . Chronic pain   . Hypertension   . Hypothyroidism     Current Outpatient Prescriptions   Medication Sig   . Adalimumab (HUMIRA) 40 MG/0.8ML Subcutaneous Kit every other week   . Albuterol Sulfate HFA (PROAIR HFA) 108 (90 BASE) MCG/ACT Inhalation Aero Soln 2 puffs inhaled every 4 hours as needed for asthma; may use every 2 hours for attacks, or preventively before exercise   . Cyclobenzaprine HCl (FLEXERIL) 10 MG Oral Tab Take 1 tablet by mouth 3 times per day as needed for muscle  pain   . DULoxetine HCl 30 MG Oral CAPSULE ENTERIC COATED PARTICLES Take 1 cap daily   . Esomeprazole Magnesium (NEXIUM OR) None Entered   . Hydrocodone-Acetaminophen 10-325 MG Oral Tab take 1 to 2 tablets by mouth every 6 hours if needed for pain , maximum daily dose of 8 tablets   . Levothyroxine Sodium 75 MCG Oral Tab take 1 tablet by mouth once daily   . Lorazepam 2 MG Oral Tab 1 TABLET TWICE DAILY   . Losartan Potassium 50 MG Oral Tab 1 po qd   . Pregabalin (LYRICA) 25 MG Oral Cap 1 tablet daily     No current facility-administered medications for this visit.       Allergies-Morphine and Penicillins    ROS: Please refer to HPI  History sections of chart reviewed and updated today: Yes    PHYSICAL EXAM:  General: healthy, alert, no distress, BMI > 35  Skin: Skin color, texture, turgor normal. No rashes or concerning lesions  Neck: No bruits noted; neck supple  Lungs: clear to auscultation  Heart: normal rate, regular rhythm  and no murmurs, clicks, or gallops    Abd: soft, non-tender, no HSM or masses.Obese;   Neuro: A&O X 3. Cranial nerves II - XII tested and intact.  He has full sensation in his thighs, knees, lower legs and feet.    ASSESSMENT/PLAN:      (244.9) Unspecified hypothyroidism  (primary encounter diagnosis)  Plan: Levothyroxine Sodium 75 MCG Oral Tab       Refill    (782.0) Leg numbness  Unable to reproduce this symptom today    (401.9) HTN (hypertension)  Plan: LIPID PANEL, COMPREHENSIVE METABOLIC PANEL    Screening    (720.0) Ankylosing spondylitis  Plan: CRP WITH CARDIAC RISK  Requested by patient    (790.1) Sedimentation rate elevation  Plan: CRP WITH CARDIAC RISK  Requested by patient    (530.81) GERD (gastroesophageal reflux disease)  Plan: Esomeprazole Magnesium 40 MG Oral CAPSULE         DELAYED RELEASE, COMPREHENSIVE METABOLIC PANEL     Refill    (161.0) Hypertension  Comment: 130's/80's with 1/2 tab, no symptoms  Plan: Losartan Potassium 50 MG Oral Tab        Discussed importance of  weight loss, nutrition, exercise and stress management. Patient is aware.  Will f/u 3 months sooner as needed            Refer to Patient instruction on AVS as well

## 2013-02-28 ENCOUNTER — Other Ambulatory Visit (INDEPENDENT_AMBULATORY_CARE_PROVIDER_SITE_OTHER): Payer: Self-pay | Admitting: Family

## 2013-03-31 ENCOUNTER — Encounter (INDEPENDENT_AMBULATORY_CARE_PROVIDER_SITE_OTHER): Payer: BLUE CROSS/BLUE SHIELD | Admitting: Family

## 2013-04-05 ENCOUNTER — Encounter (INDEPENDENT_AMBULATORY_CARE_PROVIDER_SITE_OTHER): Payer: Self-pay | Admitting: Family Medicine

## 2013-04-05 ENCOUNTER — Ambulatory Visit (INDEPENDENT_AMBULATORY_CARE_PROVIDER_SITE_OTHER): Payer: BLUE CROSS/BLUE SHIELD | Admitting: Family Medicine

## 2013-04-05 VITALS — BP 153/102 | HR 99 | Temp 98.4°F | Resp 16 | Ht 70.0 in | Wt 257.0 lb

## 2013-04-05 DIAGNOSIS — M79672 Pain in left foot: Secondary | ICD-10-CM

## 2013-04-05 DIAGNOSIS — M79609 Pain in unspecified limb: Secondary | ICD-10-CM

## 2013-04-05 LAB — PR X-RAY FOOT 3+ VW LEFT

## 2013-04-05 NOTE — Patient Instructions (Addendum)
It was a pleasure to see you in clinic today.                 Ice  Rest  Keep elevated     If you are not yet signed up for eCare, please speak with a team member at the front desk who would happy to assist you with this process.      eCare  enrollment will allow you access to the below benefits   You can make appointments online   View test results / Lab Results   Obtain a copy of our After Visit Summary (a summary of your visit today)     Your Test Results:  If labs were ordered today the results are expected to be available via eCare in about 5 days. If you have an active eCare account, this is how we will notify you of your results.     If you do not have an eCare account then your test results will be mailed to you within about 14 days after your tests are completed. If your physician needs to change your care based on your results or is concerned, you will receive a phone call.     If you have any questions about your test results please schedule an appointment with your provider.    **If it has been more than 2 weeks and you have not received your test results please send our office a message via eCare.    Medication Refills: If you need a prescription refilled, please contact your pharmacy 1 week before your current supply will run out to request the refill.  Contacting your pharmacy is the fastest and safest way to obtain a medication refill.  The pharmacy will notify our office.  Please note, that a minimum of 48 to 72 hours is needed to refill a medication, but refills are usually processed and sent to your pharmacy in about 5 business days.  Please call your pharmacy early to allow enough time to refill before you anticipate running out.  For faster medication refills, you can also schedule an appointment with your provider.    We know you have a choice in where you receive your healthcare and we sincerely thank you for trusting [facility] with your health.

## 2013-04-05 NOTE — Progress Notes (Signed)
SUBJECTIVE:   Anthony Andrade complains of localized left heel pain-points to the mid heel-plantar aspect, for 3 weeks.  Had a steroid shot on 03/23/13 at rheumatologist  Also took prednisone  No injury/no new foot wear  Takes NSAIDS for ankylosing spondylitis-not helping      Past Medical History   Diagnosis Date   . Ankylosing spondylitis    . Esophageal reflux    . Allergic rhinitis due to other allergen    . Unspecified sleep apnea    . Restless legs syndrome (RLS)      Past Surgical History   Procedure Laterality Date   . Cholecystectomy     . Unlisted procedure shoulder       left shoulder   . Esophagogastroduodenoscopy transoral diagnostic  2008     on prevacid   . Colonoscopy stoma dx w/wo collj spec spx  2008     normal per patient         OBJECTIVE:  Patient appears well, vital signs are normal. Foot exam reveals point tenderness over the inferior aspect of left heel, without masses, deformity or edema.  The rest of the foot and ankle exam is normal. Color and temperature of the feet is normal. Peripheral pulses are normal.  Gait-limping from pain    ASSESSMENT:  (729.5) Pain of left heel  (primary encounter diagnosis)  Plan:unclear etiology ,xray appears normal  Continue rest/ice/arch supports   REFERRAL TO PODIATRY, X-RAY FOOT 3+ VW LEFT

## 2013-04-06 NOTE — Progress Notes (Signed)
CD copy: (L) Foot 3v_2014/10/13 x-rays for appt w poditrist.  ROI signed by patient.

## 2013-04-12 ENCOUNTER — Encounter (INDEPENDENT_AMBULATORY_CARE_PROVIDER_SITE_OTHER): Payer: Self-pay | Admitting: Family Medicine

## 2013-04-12 NOTE — Telephone Encounter (Signed)
Patient has an appointment on 04/13/13 with Dr. Tye Savoy at [facility]. Patient is requesting a medical excuse note for the dates of October 10th-21st? Please see ecare message from patient and advise.

## 2013-04-12 NOTE — Telephone Encounter (Signed)
Letter written  And in my outbox

## 2013-04-13 NOTE — Telephone Encounter (Signed)
Forwarding to the team  pool.

## 2013-04-14 NOTE — Telephone Encounter (Signed)
Pt was given letter yesterday 04/13/2013 by Lavella Hammock.    Closing TE

## 2013-04-21 DIAGNOSIS — M79673 Pain in unspecified foot: Secondary | ICD-10-CM | POA: Insufficient documentation

## 2013-04-22 ENCOUNTER — Encounter (HOSPITAL_BASED_OUTPATIENT_CLINIC_OR_DEPARTMENT_OTHER): Payer: BLUE CROSS/BLUE SHIELD | Admitting: Adult Health

## 2013-04-29 ENCOUNTER — Other Ambulatory Visit: Payer: Self-pay

## 2013-05-31 ENCOUNTER — Telehealth (INDEPENDENT_AMBULATORY_CARE_PROVIDER_SITE_OTHER): Payer: Self-pay | Admitting: Family

## 2013-05-31 DIAGNOSIS — R21 Rash and other nonspecific skin eruption: Secondary | ICD-10-CM

## 2013-05-31 MED ORDER — CLOTRIMAZOLE-BETAMETHASONE 1-0.05 % EX CREA
1.0000 | TOPICAL_CREAM | Freq: Two times a day (BID) | CUTANEOUS | Status: DC | PRN
Start: 2013-05-31 — End: 2015-04-04

## 2013-06-01 NOTE — Telephone Encounter (Signed)
Rx filled at clinic

## 2013-06-14 ENCOUNTER — Encounter (INDEPENDENT_AMBULATORY_CARE_PROVIDER_SITE_OTHER): Payer: BLUE CROSS/BLUE SHIELD | Admitting: Internal Medicine

## 2013-07-01 ENCOUNTER — Encounter (INDEPENDENT_AMBULATORY_CARE_PROVIDER_SITE_OTHER): Payer: BLUE CROSS/BLUE SHIELD | Admitting: Internal Medicine

## 2013-07-02 ENCOUNTER — Encounter (INDEPENDENT_AMBULATORY_CARE_PROVIDER_SITE_OTHER): Payer: Self-pay | Admitting: Family

## 2013-07-02 ENCOUNTER — Ambulatory Visit (INDEPENDENT_AMBULATORY_CARE_PROVIDER_SITE_OTHER): Payer: BLUE CROSS/BLUE SHIELD | Admitting: Family

## 2013-07-02 ENCOUNTER — Telehealth (INDEPENDENT_AMBULATORY_CARE_PROVIDER_SITE_OTHER): Payer: Self-pay | Admitting: Family

## 2013-07-02 VITALS — BP 153/93 | HR 93 | Temp 97.5°F | Resp 14 | Wt 260.0 lb

## 2013-07-02 DIAGNOSIS — K219 Gastro-esophageal reflux disease without esophagitis: Secondary | ICD-10-CM

## 2013-07-02 DIAGNOSIS — J069 Acute upper respiratory infection, unspecified: Secondary | ICD-10-CM

## 2013-07-02 DIAGNOSIS — J984 Other disorders of lung: Secondary | ICD-10-CM

## 2013-07-02 MED ORDER — ALBUTEROL SULFATE (2.5 MG/3ML) 0.083% IN NEBU
2.5000 mg | INHALATION_SOLUTION | Freq: Once | RESPIRATORY_TRACT | Status: AC
Start: 2013-07-02 — End: 2013-07-02
  Administered 2013-07-02: 2.5 mg via RESPIRATORY_TRACT

## 2013-07-02 MED ORDER — PREDNISONE 10 MG OR TABS
ORAL_TABLET | ORAL | Status: DC
Start: 2013-07-02 — End: 2013-07-09

## 2013-07-02 MED ORDER — PROMETHAZINE-PHENYLEPH-CODEINE 6.25-5-10 MG/5ML OR SYRP
5.0000 mL | ORAL_SOLUTION | Freq: Four times a day (QID) | ORAL | Status: DC | PRN
Start: 2013-07-02 — End: 2013-07-15

## 2013-07-02 MED ORDER — LEVOFLOXACIN 500 MG OR TABS
500.0000 mg | ORAL_TABLET | ORAL | Status: DC
Start: 2013-07-02 — End: 2013-07-15

## 2013-07-02 MED ORDER — ESOMEPRAZOLE MAGNESIUM 40 MG OR CPDR
DELAYED_RELEASE_CAPSULE | ORAL | Status: DC
Start: 2013-07-02 — End: 2013-09-09

## 2013-07-02 NOTE — Telephone Encounter (Signed)
Caller states that patient is in the pharmacy. Please call to advise.

## 2013-07-02 NOTE — Telephone Encounter (Signed)
Pharmacy called and prescription switched to "regular" with same dosing instructions    Closing TE

## 2013-07-02 NOTE — Telephone Encounter (Signed)
CONFIRMED PHONE NUMBER: [phone]  CALLERS FIRST AND LAST NAME: Janean SarkShirley  FACILITY NAME: [facility] TITLE: Pharmacist  CALLERS RELATIONSHIP:OTHER: Pharmacist  RETURN CALL: OK to leave detailed message with anyone that answers     SUBJECT: Prescription Management   REASON FOR REQUEST: Medication Change Request    MEDICATION: Promethazine/Codeine  CONCERN/QUESTION: Pharmacy stated that prescription is not in stock and is requesting it to be change "regular" Promethazine with Codeine (6.25 mg/10 ml per 5 ml)  PRESCRIBING PROVIDER: Fonnie MuKerry Meyer  DOSE TAKING NOW:     Please advise, thank you!

## 2013-07-02 NOTE — Progress Notes (Signed)
SUBJECTIVE:  Joron Velis is an 48 year old male who presents with URI. Symptoms include   congestion, cough and fatigue. Onset 10 days, gradually worsening since   that time.     Outpatient Prescriptions Prior to Visit   Medication Sig Dispense Refill   . Adalimumab (HUMIRA) 40 MG/0.8ML Subcutaneous Kit every other week       . Albuterol Sulfate HFA (PROAIR HFA) 108 (90 BASE) MCG/ACT Inhalation Aero Soln 2 puffs inhaled every 4 hours as needed for asthma; may use every 2 hours for attacks, or preventively before exercise  1 Inhaler  6   . Clotrimazole-Betamethasone 1-0.05 % External Cream Apply 1 application topically 2 times a day as needed (rash). Apply to buttocks  45 g  2   . Cyclobenzaprine HCl (FLEXERIL) 10 MG Oral Tab Take 1 tablet by mouth 3 times per day as needed for muscle pain  60 Tab  1   . DULoxetine HCl 30 MG Oral CAPSULE ENTERIC COATED PARTICLES Take 1 cap daily       . Esomeprazole Magnesium 40 MG Oral CAPSULE DELAYED RELEASE One tablet twice per day on empty stomach  180 capsule  1   . Hydrocodone-Acetaminophen 10-325 MG Oral Tab take 1 to 2 tablets by mouth every 6 hours if needed for pain , maximum daily dose of 8 tablets  30 tablet  0   . Levothyroxine Sodium 75 MCG Oral Tab Take 1 tablet (75 mcg) by mouth daily.  90 tablet  3   . Lorazepam 2 MG Oral Tab 1 TABLET TWICE DAILY       . Losartan Potassium 50 MG Oral Tab 1 po qd  30 tablet  5   . Pregabalin (LYRICA) 25 MG Oral Cap 1 tablet daily    0     No facility-administered medications prior to visit.     Review of patient's allergies indicates:  Allergies   Allergen Reactions   . Morphine Nausea/Vomiting   . Penicillins Rash       History   Substance Use Topics   . Smoking status: Former Research scientist (life sciences)   . Smokeless tobacco: Not on file   . Alcohol Use: No       OBJECTIVE:  BP 153/93  Pulse 93  Temp(Src) 97.5 F (36.4 C) (Temporal)  Resp 14  Wt 260 lb (117.935 kg)  BMI 37.31 kg/m2  SpO2 96%  PF 200 L/min  General appearance: healthy,  alert, flushed  Ears: R TM - amber colored, L TM - amber colored  Nose: congested  Oropharynx: mild erythema  Neck: supple and no adenopathy  Lungs: inspiratory rhonchi; rales on right middle lobe;   Heart: normal rate, regular rhythm and no murmurs, clicks, or gallops    ASSESSMENT:  Acute URI with possible early pneumonia    Given hx patient preferred to start antibiotics     1. GERD (gastroesophageal reflux disease)  - Esomeprazole Magnesium 40 MG Oral CAPSULE DELAYED RELEASE; One tablet twice per day on empty stomach  Dispense: 180 capsule; Refill: 1    2. Restrictive airway disease  - albuterol 2.5 mg/3 mL (0.083%) Inhalation solution; Inhale 3 mL (2.5 mg) via nebulizer Once.  - Phenyleph-Promethazine-Cod (PROMETHAZINE VC/CODEINE) 5-6.25-10 MG/5ML Oral Syrup; Take 5 mL by mouth every 6 hours as needed for cough or congestion.  Dispense: 240 mL; Refill: 0    3. Acute upper respiratory infection  - Levofloxacin 500 MG Oral Tab; Take 1 tablet (500 mg) by  mouth every 24 hours.  Dispense: 7 tablet; Refill: 0  - PredniSONE 10 MG Oral Tab; Six tablets today and one less each day until completed  Dispense: 21 tablet; Refill: 0    Patient

## 2013-07-02 NOTE — Patient Instructions (Signed)
It was a pleasure to see you in clinic today.                  If you are not yet signed up for eCare, please speak with a team member at the front desk who would happy to assist you with this process.      eCare  enrollment will allow you access to the below benefits   You can make appointments online   View test results / Lab Results   Obtain a copy of our After Visit Summary (a summary of your visit today)     Your Test Results:  If labs were ordered today the results are expected to be available via eCare in about 5 days. If you have an active eCare account, this is how we will notify you of your results.     If you do not have an eCare account then your test results will be mailed to you within about 14 days after your tests are completed. If your physician needs to change your care based on your results or is concerned, you will receive a phone call.     If you have any questions about your test results please schedule an appointment with your provider.    **If it has been more than 2 weeks and you have not received your test results please send our office a message via eCare.    Medication Refills: If you need a prescription refilled, please contact your pharmacy 1 week before your current supply will run out to request the refill.  Contacting your pharmacy is the fastest and safest way to obtain a medication refill.  The pharmacy will notify our office.  Please note, that a minimum of 48 to 72 hours is needed to refill a medication, but refills are usually processed and sent to your pharmacy in about 5 business days.  Please call your pharmacy early to allow enough time to refill before you anticipate running out.  For faster medication refills, you can also schedule an appointment with your provider.    We know you have a choice in where you receive your healthcare and we sincerely thank you for trusting [facility] with your health.

## 2013-07-06 ENCOUNTER — Encounter (INDEPENDENT_AMBULATORY_CARE_PROVIDER_SITE_OTHER): Payer: Self-pay | Admitting: Family

## 2013-07-06 ENCOUNTER — Telehealth (INDEPENDENT_AMBULATORY_CARE_PROVIDER_SITE_OTHER): Payer: Self-pay | Admitting: Family

## 2013-07-06 DIAGNOSIS — J069 Acute upper respiratory infection, unspecified: Secondary | ICD-10-CM

## 2013-07-06 DIAGNOSIS — J40 Bronchitis, not specified as acute or chronic: Secondary | ICD-10-CM

## 2013-07-06 NOTE — Telephone Encounter (Signed)
The patient that I had ordered a chest x-ray.  He will make a lab appointment.  Also informed him that it will take at least 2 weeks for him to completely resolve the cough even up to 3 weeks.  Continue to drink water and stay on the medication for now.

## 2013-07-06 NOTE — Telephone Encounter (Signed)
Anthony DimmerKerry please see below pt is reporting that he is still feeling sick.  Would a refill on the Levofloxacin be something he would need?    Please advise  Routing to R.R. DonnelleyKerry Andrade

## 2013-07-06 NOTE — Telephone Encounter (Signed)
CONFIRMED PHONE NUMBER: [phone]   CALLERS FIRST AND LAST NAME: Delos HaringMartin Andrew Cavness  FACILITY NAME: na TITLE: na  CALLERS RELATIONSHIP:Self  RETURN CALL: Detailed message on voicemail only     SUBJECT: General Message   REASON FOR REQUEST: Pneumonia and antibiotics     MESSAGE: Patient was diagnosed with Pneumonia and has two pill left of the Levofloxacin that was given and he says he still feels very sick. Please call and advise if he should have a refill of the medication. Patient is still taking the prednisone as well and is also going to be out tomorrow. Patient is using the Phenyleph-Promethazine and says it doesn't help much. Patient says his chest still feels heavy and is still coughing often. Please call and advise. CCR attempted to transfer to nurse but was on hold too long.

## 2013-07-07 MED ORDER — BENZONATATE 100 MG OR CAPS
ORAL_CAPSULE | ORAL | Status: DC
Start: 2013-07-07 — End: 2013-07-15

## 2013-07-07 NOTE — Telephone Encounter (Signed)
Patient states is still coughing brown mucus and has two days of antibiotics left. Wants to know if she needs to continue antibiotics, and if so needs refill. Will be seen on Friday, and needs forms filled out for missing work.     Routing to provider

## 2013-07-08 ENCOUNTER — Other Ambulatory Visit (INDEPENDENT_AMBULATORY_CARE_PROVIDER_SITE_OTHER): Payer: BLUE CROSS/BLUE SHIELD

## 2013-07-09 ENCOUNTER — Ambulatory Visit (INDEPENDENT_AMBULATORY_CARE_PROVIDER_SITE_OTHER): Payer: BLUE CROSS/BLUE SHIELD | Admitting: Family

## 2013-07-09 ENCOUNTER — Telehealth (INDEPENDENT_AMBULATORY_CARE_PROVIDER_SITE_OTHER): Payer: Self-pay | Admitting: Family

## 2013-07-09 ENCOUNTER — Other Ambulatory Visit (INDEPENDENT_AMBULATORY_CARE_PROVIDER_SITE_OTHER): Payer: BLUE CROSS/BLUE SHIELD

## 2013-07-09 ENCOUNTER — Encounter (INDEPENDENT_AMBULATORY_CARE_PROVIDER_SITE_OTHER): Payer: Self-pay | Admitting: Family

## 2013-07-09 VITALS — BP 151/97 | HR 111 | Wt 260.0 lb

## 2013-07-09 DIAGNOSIS — J4531 Mild persistent asthma with (acute) exacerbation: Secondary | ICD-10-CM

## 2013-07-09 DIAGNOSIS — J45901 Unspecified asthma with (acute) exacerbation: Secondary | ICD-10-CM

## 2013-07-09 DIAGNOSIS — J069 Acute upper respiratory infection, unspecified: Secondary | ICD-10-CM

## 2013-07-09 MED ORDER — PREDNISONE 10 MG OR TABS
ORAL_TABLET | ORAL | Status: DC
Start: 2013-07-09 — End: 2013-07-15

## 2013-07-09 NOTE — Patient Instructions (Signed)
It was a pleasure to see you in clinic today.                  If you are not yet signed up for eCare, please speak with a team member at the front desk who would happy to assist you with this process.      eCare  enrollment will allow you access to the below benefits   You can make appointments online   View test results / Lab Results   Obtain a copy of our After Visit Summary (a summary of your visit today)     Your Test Results:  If labs were ordered today the results are expected to be available via eCare in about 5 days. If you have an active eCare account, this is how we will notify you of your results.     If you do not have an eCare account then your test results will be mailed to you within about 14 days after your tests are completed. If your physician needs to change your care based on your results or is concerned, you will receive a phone call.     If you have any questions about your test results please schedule an appointment with your provider.    **If it has been more than 2 weeks and you have not received your test results please send our office a message via eCare.    Medication Refills: If you need a prescription refilled, please contact your pharmacy 1 week before your current supply will run out to request the refill.  Contacting your pharmacy is the fastest and safest way to obtain a medication refill.  The pharmacy will notify our office.  Please note, that a minimum of 48 to 72 hours is needed to refill a medication, but refills are usually processed and sent to your pharmacy in about 5 business days.  Please call your pharmacy early to allow enough time to refill before you anticipate running out.  For faster medication refills, you can also schedule an appointment with your provider.    We know you have a choice in where you receive your healthcare and we sincerely thank you for trusting [facility] with your health.

## 2013-07-09 NOTE — Telephone Encounter (Signed)
Anthony Andrade [phone] option [phone] Claim # [id number]    Catrice from [facility] [employer] is calling as she states that they received the "attending Physician's statement" however it was missing two pieces of information that they need. She states it is ok to just call with the info and speak to rep. They need the Diagnosis & ICD-9 codes. Thank you.
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
## 2013-07-09 NOTE — Telephone Encounter (Signed)
Received an attending physician's statement form via fax. Form placed in Dr. Nash DimmerKerry Meyer's in box to complete    Main contact for form if there are questions/concerns: Charlett NoseJoseph Cato  Phone #: [phone] Fax #: [phone]  Please route back to the Team Pool when complete.

## 2013-07-11 DIAGNOSIS — J45909 Unspecified asthma, uncomplicated: Secondary | ICD-10-CM | POA: Insufficient documentation

## 2013-07-11 HISTORY — DX: Unspecified asthma, uncomplicated: J45.909

## 2013-07-12 ENCOUNTER — Encounter (INDEPENDENT_AMBULATORY_CARE_PROVIDER_SITE_OTHER): Payer: Self-pay | Admitting: Family

## 2013-07-12 NOTE — Telephone Encounter (Signed)
Please locate form that was faxed in order to address concern for ICD9 code

## 2013-07-12 NOTE — Telephone Encounter (Signed)
[employer] disability calling again, requesting the diagnosis and ICD-9 code for the paperwork. She states it was missing from the form. Ph# [phone] option 4, okay to speak with anyone who answers. Claim # [id number]. Open M-F 5am-5pm PST.

## 2013-07-12 NOTE — Telephone Encounter (Signed)
Patient states that diagnosis is [facility]. Would like to have it filled out.    Routing to provider

## 2013-07-13 NOTE — Telephone Encounter (Signed)
Mary with [employer] claims called in to follow up on the needed ICD-9 code /diagnosis code (she advised they have a deadline of 07/15/2013).  Please Museum/gallery exhibitions officercontact [employer] and reference Claim # [id number]. Thank you

## 2013-07-13 NOTE — Telephone Encounter (Signed)
Closing TE:  Fonnie MuKerry Meyer called patient and left a voicemail and replied to patients eCare message same day

## 2013-07-13 NOTE — Telephone Encounter (Signed)
Routing to Venezuelachristina and kerry to call with info

## 2013-07-14 ENCOUNTER — Encounter (INDEPENDENT_AMBULATORY_CARE_PROVIDER_SITE_OTHER): Payer: BLUE CROSS/BLUE SHIELD | Admitting: Family

## 2013-07-14 NOTE — Telephone Encounter (Signed)
Spoke with Anthony Andrade with [employer] at [phone] and gave her diagnosis and icd-9 code verbally as well as faxing form over again this morning.     Closing TE

## 2013-07-14 NOTE — Telephone Encounter (Signed)
Routing to MershonKerry please advise if this has completed and if the TE can be closed?

## 2013-07-15 ENCOUNTER — Encounter (INDEPENDENT_AMBULATORY_CARE_PROVIDER_SITE_OTHER): Payer: Self-pay | Admitting: Family

## 2013-07-15 ENCOUNTER — Ambulatory Visit (INDEPENDENT_AMBULATORY_CARE_PROVIDER_SITE_OTHER): Payer: BLUE CROSS/BLUE SHIELD | Admitting: Family

## 2013-07-15 VITALS — BP 144/96 | HR 76 | Temp 95.6°F | Wt 254.4 lb

## 2013-07-15 DIAGNOSIS — R6883 Chills (without fever): Secondary | ICD-10-CM

## 2013-07-15 DIAGNOSIS — J45909 Unspecified asthma, uncomplicated: Secondary | ICD-10-CM

## 2013-07-15 DIAGNOSIS — E039 Hypothyroidism, unspecified: Secondary | ICD-10-CM

## 2013-07-15 DIAGNOSIS — R059 Cough, unspecified: Secondary | ICD-10-CM

## 2013-07-15 DIAGNOSIS — R5383 Other fatigue: Secondary | ICD-10-CM

## 2013-07-15 DIAGNOSIS — J4520 Mild intermittent asthma, uncomplicated: Secondary | ICD-10-CM

## 2013-07-15 DIAGNOSIS — R5381 Other malaise: Secondary | ICD-10-CM

## 2013-07-15 LAB — CBC, DIFF
% Basophils: 0 %
% Eosinophils: 2 %
% Immature Granulocytes: 2 %
% Lymphocytes: 32 %
% Monocytes: 8 %
% Neutrophils: 56 %
Absolute Eosinophil Count: 0.21 10*3/uL (ref 0.00–0.50)
Absolute Lymphocyte Count: 4.53 10*3/uL (ref 1.00–4.80)
Basophils: 0.06 10*3/uL (ref 0.00–0.20)
Hematocrit: 47 % (ref 38–50)
Hemoglobin: 16.7 g/dL (ref 13.0–18.0)
Immature Granulocytes: 0.24 10*3/uL — ABNORMAL HIGH (ref 0.00–0.05)
MCH: 32.2 pg (ref 27.3–33.6)
MCHC: 35.2 g/dL (ref 32.2–36.5)
MCV: 91 fL (ref 81–98)
Monocytes: 1.16 10*3/uL — ABNORMAL HIGH (ref 0.00–0.80)
Neutrophils: 8.16 10*3/uL — ABNORMAL HIGH (ref 1.80–7.00)
Platelet Count: 199 10*3/uL (ref 150–400)
RBC: 5.19 mil/uL (ref 4.40–5.60)
RDW-CV: 12.4 % (ref 11.6–14.4)
WBC: 14.36 10*3/uL — ABNORMAL HIGH (ref 4.3–10.0)

## 2013-07-15 MED ORDER — ALBUTEROL SULFATE HFA 108 (90 BASE) MCG/ACT IN AERS
2.0000 | INHALATION_SPRAY | RESPIRATORY_TRACT | Status: DC | PRN
Start: 2013-07-15 — End: 2013-07-15

## 2013-07-15 MED ORDER — ALBUTEROL SULFATE HFA 108 (90 BASE) MCG/ACT IN AERS
2.0000 | INHALATION_SPRAY | RESPIRATORY_TRACT | Status: DC | PRN
Start: 2013-07-15 — End: 2015-06-14

## 2013-07-15 NOTE — Patient Instructions (Signed)
It was a pleasure to see you in clinic today.                  If you are not yet signed up for eCare, please speak with a team member at the front desk who would happy to assist you with this process.      eCare  enrollment will allow you access to the below benefits   You can make appointments online   View test results / Lab Results   Obtain a copy of our After Visit Summary (a summary of your visit today)     Your Test Results:  If labs were ordered today the results are expected to be available via eCare in about 5 days. If you have an active eCare account, this is how we will notify you of your results.     If you do not have an eCare account then your test results will be mailed to you within about 14 days after your tests are completed. If your physician needs to change your care based on your results or is concerned, you will receive a phone call.     If you have any questions about your test results please schedule an appointment with your provider.    **If it has been more than 2 weeks and you have not received your test results please send our office a message via eCare.    Medication Refills: If you need a prescription refilled, please contact your pharmacy 1 week before your current supply will run out to request the refill.  Contacting your pharmacy is the fastest and safest way to obtain a medication refill.  The pharmacy will notify our office.  Please note, that a minimum of 48 to 72 hours is needed to refill a medication, but refills are usually processed and sent to your pharmacy in about 5 business days.  Please call your pharmacy early to allow enough time to refill before you anticipate running out.  For faster medication refills, you can also schedule an appointment with your provider.    We know you have a choice in where you receive your healthcare and we sincerely thank you for trusting [facility] with your health.
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
## 2013-07-16 ENCOUNTER — Telehealth (INDEPENDENT_AMBULATORY_CARE_PROVIDER_SITE_OTHER): Payer: Self-pay | Admitting: Family

## 2013-07-16 DIAGNOSIS — J189 Pneumonia, unspecified organism: Secondary | ICD-10-CM

## 2013-07-16 LAB — FERRITIN: Ferritin: 154 ng/mL (ref 20–230)

## 2013-07-16 LAB — T4, FREE: Thyroxine (Free): 1 ng/dL (ref 0.6–1.2)

## 2013-07-16 LAB — THYROID STIMULATING HORMONE: Thyroid Stimulating Hormone: 3.858 u[IU]/mL (ref 0.400–5.000)

## 2013-07-16 MED ORDER — AZITHROMYCIN 250 MG OR TABS
ORAL_TABLET | ORAL | Status: DC
Start: 2013-07-16 — End: 2013-07-28

## 2013-07-16 NOTE — Telephone Encounter (Signed)
Sent new prescription to pharmacy

## 2013-07-17 LAB — VITAMIN D (25 HYDROXY)
Vit D (25_Hydroxy) Total: 25.9 ng/mL (ref 20.1–50.0)
Vitamin D2 (25_Hydroxy): 1.1 ng/mL
Vitamin D3 (25_Hydroxy): 24.8 ng/mL

## 2013-07-19 ENCOUNTER — Encounter (INDEPENDENT_AMBULATORY_CARE_PROVIDER_SITE_OTHER): Payer: BLUE CROSS/BLUE SHIELD | Admitting: Family

## 2013-07-19 ENCOUNTER — Encounter (INDEPENDENT_AMBULATORY_CARE_PROVIDER_SITE_OTHER): Payer: BLUE CROSS/BLUE SHIELD | Admitting: Internal Medicine

## 2013-07-25 ENCOUNTER — Encounter (INDEPENDENT_AMBULATORY_CARE_PROVIDER_SITE_OTHER): Payer: Self-pay | Admitting: Family

## 2013-07-25 DIAGNOSIS — J42 Unspecified chronic bronchitis: Secondary | ICD-10-CM

## 2013-07-26 NOTE — Telephone Encounter (Signed)
Routing pts ecare msg to Fonnie MuKerry Meyer, ARNP and covering provider to advise on pts ecare msg.

## 2013-07-27 MED ORDER — BECLOMETHASONE DIPROPIONATE 80 MCG/ACT IN AERS
1.0000 | INHALATION_SPRAY | Freq: Two times a day (BID) | RESPIRATORY_TRACT | Status: DC
Start: 2013-07-27 — End: 2013-07-28

## 2013-07-27 MED ORDER — ALBUTEROL SULFATE (2.5 MG/3ML) 0.083% IN NEBU
2.5000 mg | INHALATION_SOLUTION | Freq: Four times a day (QID) | RESPIRATORY_TRACT | Status: DC | PRN
Start: 2013-07-27 — End: 2015-06-14

## 2013-07-27 MED ORDER — NEBULIZER DEVI
1.0000 | Freq: Four times a day (QID) | Status: DC | PRN
Start: 2013-07-27 — End: 2017-08-25

## 2013-07-28 ENCOUNTER — Telehealth (INDEPENDENT_AMBULATORY_CARE_PROVIDER_SITE_OTHER): Payer: Self-pay | Admitting: Family

## 2013-07-28 ENCOUNTER — Encounter (INDEPENDENT_AMBULATORY_CARE_PROVIDER_SITE_OTHER): Payer: Self-pay | Admitting: Family

## 2013-07-28 ENCOUNTER — Ambulatory Visit (INDEPENDENT_AMBULATORY_CARE_PROVIDER_SITE_OTHER): Payer: BLUE CROSS/BLUE SHIELD | Admitting: Family

## 2013-07-28 VITALS — BP 132/88 | HR 82 | Temp 98.0°F | Wt 254.3 lb

## 2013-07-28 DIAGNOSIS — J45901 Unspecified asthma with (acute) exacerbation: Secondary | ICD-10-CM

## 2013-07-28 DIAGNOSIS — J4541 Moderate persistent asthma with (acute) exacerbation: Secondary | ICD-10-CM

## 2013-07-28 DIAGNOSIS — Z5189 Encounter for other specified aftercare: Secondary | ICD-10-CM

## 2013-07-28 DIAGNOSIS — T7840XD Allergy, unspecified, subsequent encounter: Secondary | ICD-10-CM

## 2013-07-28 MED ORDER — MONTELUKAST SODIUM 10 MG OR TABS
10.0000 mg | ORAL_TABLET | Freq: Every evening | ORAL | Status: DC
Start: 2013-07-28 — End: 2013-10-08

## 2013-07-28 MED ORDER — PROMETHAZINE-CODEINE 6.25-10 MG/5ML OR SYRP
5.0000 mL | ORAL_SOLUTION | Freq: Four times a day (QID) | ORAL | Status: DC | PRN
Start: 2013-07-28 — End: 2013-08-16

## 2013-07-28 MED ORDER — FEXOFENADINE-PSEUDOEPHED ER 60-120 MG OR TB12
1.0000 | EXTENDED_RELEASE_TABLET | Freq: Two times a day (BID) | ORAL | Status: DC | PRN
Start: 2013-07-28 — End: 2014-07-26

## 2013-07-28 NOTE — Telephone Encounter (Signed)
please give verbal ok.    Ok to call in under my name if needed

## 2013-07-28 NOTE — Progress Notes (Signed)
Here for Ongoing Cough and Asthma Exacerbation:    Patient is experiencing ongoing cough. In the morning he has brown mucous , then after awhile then he has mucous and sometimes feels a little clear.  Warm showers do help.  Worse in the morning.  CPAP at night every night.  He is also coughing enough to wake him several times at night.  He also takes mucinex for his cough as well.  Not taking anything for his allergies. He has completed two rounds of antibiotics and two rounds of prednisone.    Knows stress is high but needs to go back to work.    Patient Active Problem List   Diagnosis   . DEPRESSIVE DISORDER NEC   . ALLERGIC RHINITIS NOS   . ANKYLOSING SPONDYLITIS   . Chronic pain   . Hypertension   . Hypothyroidism   . Foot pain   . Other acquired absence of organ   . Other postprocedural status(V45.89)   . Unspecified sleep apnea   . Asthma     Outpatient Prescriptions Prior to Visit   Medication Sig Dispense Refill   . Albuterol Sulfate (2.5 MG/3ML) 0.083% Inhalation Nebu Soln Inhale 3 mL (2.5 mg) via nebulizer every 6 hours as needed for shortness of breath/wheezing.  100 vial  1   . Albuterol Sulfate HFA (PROAIR HFA) 108 (90 BASE) MCG/ACT Inhalation Aero Soln Inhale 2 puffs by mouth every 4 hours as needed (asthma). For Asthma. May use every 2 hours for attacks, or pereventively before exercise.  1 Inhaler  6   . Azithromycin 250 MG Oral Tab Take 2 tablets today, then take 1 tablet every day until gone.  6 tablet  0   . Beclomethasone Dipropionate 80 MCG/ACT Inhalation Aero Soln Inhale 1 puff by mouth 2 times a day.  1 Inhaler  1   . Certolizumab Pegol (CIMZIA PREFILLED) 2 X 200 MG/ML Subcutaneous Kit Inject 200 mg under the skin every 2 weeks.       . Clotrimazole-Betamethasone 1-0.05 % External Cream Apply 1 application topically 2 times a day as needed (rash). Apply to buttocks  45 g  2   . Cyclobenzaprine HCl (FLEXERIL) 10 MG Oral Tab Take 1 tablet by mouth 3 times per day as needed for muscle pain  60  Tab  1   . Esomeprazole Magnesium 40 MG Oral CAPSULE DELAYED RELEASE One tablet twice per day on empty stomach  180 capsule  1   . Hydrocodone-Acetaminophen 10-325 MG Oral Tab take 1 to 2 tablets by mouth every 6 hours if needed for pain , maximum daily dose of 8 tablets  30 tablet  0   . Levothyroxine Sodium 75 MCG Oral Tab Take 1 tablet (75 mcg) by mouth daily.  90 tablet  3   . Lorazepam 2 MG Oral Tab 1 TABLET TWICE DAILY       . Losartan Potassium 50 MG Oral Tab 1 po qd  30 tablet  5   . LYRICA 75 MG Oral Cap        . Meloxicam 15 MG Oral Tab     0   . Promethazine-Codeine 6.25-10 MG/5ML Oral Syrup     0   . Respiratory Therapy Supplies (NEBULIZER) Does not apply Device Use every 6 hours as needed for other (chronic bronchitis). Chronic bronchitis  1 Device  0     No facility-administered medications prior to visit.     PHYSICAL EXAM:  General: healthy, alert,  mild distress, pale, flushed  Skin: positives: dry  Head: Normocephalic. No masses, lesions, tenderness or abnormalities  Nose:mucosal erythema  Oropharynx: Lips, mucosa, and tongue normal. Teeth and gums normal., posterior pharynx without erythema or drainage  Neck: supple. No adenopathy. Thyroid symmetric, normal size, without nodules  Lungs: expiratory wheezes  Heart: normal rate, regular rhythm and no murmurs, clicks, or gallops  Back: Back symmetric, no deformity; ROM normal; No CVA tenderness.  Abd: soft, non-tender. BS normal. No masses or organomegaly  Peak Flow: 380    Impression:    Allergies may be impacting Asthma exacerbation.  Will refer to Pulmonary if not improved        Asthma and Allergy, subsequent encounter    - Montelukast Sodium 10 MG Oral Tab; Take 1 tablet (10 mg) by mouth every evening.  Dispense: 90 tablet; Refill: 1  - Fexofenadine-Pseudoephed ER 60-120 MG Oral TABLET SR 12 HR; Take 1 tablet by mouth every 12 hours as needed for allergies.  Dispense: 30 tablet; Refill: 1  - REFERRAL TO ALLERGY/IMMUNOLOGY  - Promethazine-Codeine  6.25-10 MG/5ML Oral Syrup; Take 5 mL by mouth every 6 hours as needed for allergies (cough). Take 1 or 2 teaspoons every 6 hrs as needed  Dispense: 240 mL;   F/u in 2 to 4 weeks or sooner as needed

## 2013-07-28 NOTE — Patient Instructions (Signed)
It was a pleasure to see you in clinic today.                  If you are not yet signed up for eCare, please speak with a team member at the front desk who would happy to assist you with this process.      eCare  enrollment will allow you access to the below benefits   You can make appointments online   View test results / Lab Results   Obtain a copy of our After Visit Summary (a summary of your visit today)     Your Test Results:  If labs were ordered today the results are expected to be available via eCare in about 5 days. If you have an active eCare account, this is how we will notify you of your results.     If you do not have an eCare account then your test results will be mailed to you within about 14 days after your tests are completed. If your physician needs to change your care based on your results or is concerned, you will receive a phone call.     If you have any questions about your test results please schedule an appointment with your provider.    **If it has been more than 2 weeks and you have not received your test results please send our office a message via eCare.    Medication Refills: If you need a prescription refilled, please contact your pharmacy 1 week before your current supply will run out to request the refill.  Contacting your pharmacy is the fastest and safest way to obtain a medication refill.  The pharmacy will notify our office.  Please note, that a minimum of 48 to 72 hours is needed to refill a medication, but refills are usually processed and sent to your pharmacy in about 5 business days.  Please call your pharmacy early to allow enough time to refill before you anticipate running out.  For faster medication refills, you can also schedule an appointment with your provider.    We know you have a choice in where you receive your healthcare and we sincerely thank you for trusting [facility] with your health.

## 2013-07-28 NOTE — Telephone Encounter (Signed)
Rx for Promethazine - Codeine not able to be filled due to [facility] DEA number being expired. Routing to another provider to re write the Rx. [facility] states he can take a verbal if authorized.  Last Office Visit Date (relevant to concern):  07/28/2013  Last provider seen for above Office Visit: Fonnie MuKerry Meyer  Copy of plan from last relevant visit:  Need new Rx due to provider DEA number being expired.    Short summary of question/request for provider:  Need new Rx due to provider DEA number being expired.        Please route to provider who last saw patient for this concern or prescription.

## 2013-07-28 NOTE — Telephone Encounter (Signed)
CONFIRMED PHONE NUMBER: [phone]  Press option [phone] (eight)  CALLERS FIRST AND LAST NAME: Anthony HornsLowell Andrade  FACILITY NAME: [facility] TITLE: Pharmacist  CALLERS RELATIONSHIP:OTHER:   RETURN CALL: OK to leave detailed message with anyone that answers; ask for Anthony     SUBJECT: Prescription Management   REASON FOR REQUEST: Still waiting for response    MEDICATION: Promethazine-Codeine 6.25-10 MG/5ML Oral Syrup  CONCERN/QUESTION: Pharmacist still awaiting response  PRESCRIBING PROVIDER: [name], [name]  DOSE TAKING NOW: see above  PHARMACY NAME, LOCATION, & PHONE #:   [address] [facility] [phone] [address] [facility] [place] [phone]    Patient is waiting in pharmacy at this time.    Please call pharmacy back as soon as possible.    Thank you!

## 2013-07-29 NOTE — Telephone Encounter (Signed)
Spoke with OdinLowell.Will fill under Sharyn CreamerMichelle Vierra.    Closing te.

## 2013-07-30 ENCOUNTER — Ambulatory Visit (INDEPENDENT_AMBULATORY_CARE_PROVIDER_SITE_OTHER): Payer: Self-pay | Admitting: Internal Medicine

## 2013-08-03 ENCOUNTER — Telehealth (INDEPENDENT_AMBULATORY_CARE_PROVIDER_SITE_OTHER): Payer: Self-pay | Admitting: Family

## 2013-08-03 DIAGNOSIS — R053 Chronic cough: Secondary | ICD-10-CM

## 2013-08-03 MED ORDER — FLUTICASONE-SALMETEROL 500-50 MCG/ACT IN AEPB
1.0000 | INHALATION_SPRAY | Freq: Two times a day (BID) | RESPIRATORY_TRACT | Status: DC
Start: 2013-08-03 — End: 2013-10-10

## 2013-08-03 NOTE — Telephone Encounter (Signed)
Please call patient.  Ask for additional information on the [employer] form (top portion) then we can fax to employer for short term disability    Thanks    Nash DimmerKerry

## 2013-08-04 ENCOUNTER — Telehealth (HOSPITAL_BASED_OUTPATIENT_CLINIC_OR_DEPARTMENT_OTHER): Payer: Self-pay | Admitting: Allergy & Immunology

## 2013-08-04 NOTE — Telephone Encounter (Signed)
CONFIRMED PHONE NUMBER:   Telephone Information:   Home Phone [phone]   Work Phone [phone]   Mobile [phone]     CALLERS FIRST AND LAST NAME: Delos HaringMartin Andrew Haig  FACILITY NAME: na TITLE: n  CALLERS RELATIONSHIP:Self  RETURN CALL: No call back needed     SUBJECT: General Message   REASON FOR REQUEST: cancelled appointment for 10/17/13 rescheduled to 08/05/13    MESSAGE: patient returned call and was able to get in on 08/05/13

## 2013-08-05 ENCOUNTER — Ambulatory Visit: Payer: BLUE CROSS/BLUE SHIELD | Attending: Allergy & Immunology

## 2013-08-05 ENCOUNTER — Encounter (HOSPITAL_BASED_OUTPATIENT_CLINIC_OR_DEPARTMENT_OTHER): Payer: Self-pay | Admitting: Allergy & Immunology

## 2013-08-05 ENCOUNTER — Ambulatory Visit (HOSPITAL_BASED_OUTPATIENT_CLINIC_OR_DEPARTMENT_OTHER): Payer: BLUE CROSS/BLUE SHIELD

## 2013-08-05 ENCOUNTER — Ambulatory Visit (HOSPITAL_BASED_OUTPATIENT_CLINIC_OR_DEPARTMENT_OTHER): Payer: BLUE CROSS/BLUE SHIELD | Admitting: Allergy & Immunology

## 2013-08-05 VITALS — BP 147/99 | HR 107 | Temp 97.2°F | Ht 70.0 in | Wt 259.0 lb

## 2013-08-05 DIAGNOSIS — J309 Allergic rhinitis, unspecified: Secondary | ICD-10-CM | POA: Insufficient documentation

## 2013-08-05 DIAGNOSIS — J45901 Unspecified asthma with (acute) exacerbation: Secondary | ICD-10-CM

## 2013-08-05 DIAGNOSIS — R0602 Shortness of breath: Secondary | ICD-10-CM

## 2013-08-05 DIAGNOSIS — J4541 Moderate persistent asthma with (acute) exacerbation: Secondary | ICD-10-CM

## 2013-08-05 DIAGNOSIS — J4521 Mild intermittent asthma with (acute) exacerbation: Secondary | ICD-10-CM

## 2013-08-05 MED ORDER — TIOTROPIUM BROMIDE MONOHYDRATE 18 MCG IN CAPS
18.0000 ug | ORAL_CAPSULE | Freq: Every day | RESPIRATORY_TRACT | Status: DC
Start: 2013-08-05 — End: 2013-11-12

## 2013-08-05 MED ORDER — PREDNISONE 20 MG OR TABS
40.0000 mg | ORAL_TABLET | Freq: Every day | ORAL | Status: DC
Start: 2013-08-05 — End: 2013-10-10

## 2013-08-05 MED ORDER — BENZONATATE 200 MG OR CAPS
200.0000 mg | ORAL_CAPSULE | Freq: Three times a day (TID) | ORAL | Status: DC | PRN
Start: 2013-08-05 — End: 2013-08-20

## 2013-08-05 MED ORDER — BUDESONIDE 0.5 MG/2ML IN SUSP
0.5000 mg | Freq: Two times a day (BID) | RESPIRATORY_TRACT | Status: AC
Start: 2013-08-05 — End: 2013-08-19

## 2013-08-05 MED ORDER — FORMOTEROL FUMARATE 20 MCG/2ML IN NEBU
2.0000 mL | INHALATION_SOLUTION | Freq: Two times a day (BID) | RESPIRATORY_TRACT | Status: DC
Start: 2013-08-05 — End: 2013-08-20

## 2013-08-05 NOTE — Progress Notes (Signed)
Allergy and Immunology New Patient    MRN: [id number]  Name: Anthony Andrade  Date: 08/05/2013    Dear Dr. Bjorn Loser,    I had the pleasure of seeing Anthony Andrade in allergy/immunology clinic.  This patient was referred by Dr. Bjorn Loser in consultation for shortness of breath.  Patient is a 48 year old male with a chief compaint of increased cough and shortness of breath.     Chief Complaint   Patient presents with   . Allergic Rhinitis      cough and shortness of breath   . Asthma         #Shortness of breath:   Mr. [name] is a man with history of exercise-induced asthma for 10-15 years, who presents to clinic today due to worsening shortness of breath since January. He reports that his asthma has generally been well-controlled over the past 15 years since his diagnosis. He requires only albuterol prior to exercise. However, at the beginning of January he developed an illness that has led to persistent SOB and cough. He denied any rhinorrhea, sore throat or other upper respiratory symptoms but rather had a productive cough of green/brown sputum. He went to his PCP during the first week of January and was initially prescribed prednisone for 6 days, an antibiotic and albuterol inhaler. He reports some improvement in his symptoms initially better that they never fully improved and returned to his PCP, again receiving 4 days of prednisone and prescribed azithromycin. He feels that he did not have much improvement in his symptoms. He returned to his PCP and had a CXR on 1/16, which was clear at that point without signs of pneumonia. Then, he developed worsening of his symptoms, which he describes as "getting hit by a truck". He experienced worsening of his cough, with increased sputum production and worsening shortness of breath to the point of having to stop to catch his breath after walking 100 yards. These symptoms have persisted since this point without improvement. He denies any fever/chills.        Past Medical  History  Past Medical History   Diagnosis Date   . Ankylosing spondylitis    . Esophageal reflux    . Allergic rhinitis due to other allergen    . Unspecified sleep apnea    . Restless legs syndrome (RLS)          Medications: reviewed in EMR  Outpatient Prescriptions Prior to Visit   Medication Sig Dispense Refill   . Albuterol Sulfate (2.5 MG/3ML) 0.083% Inhalation Nebu Soln Inhale 3 mL (2.5 mg) via nebulizer every 6 hours as needed for shortness of breath/wheezing.  100 vial  1   . Albuterol Sulfate HFA (PROAIR HFA) 108 (90 BASE) MCG/ACT Inhalation Aero Soln Inhale 2 puffs by mouth every 4 hours as needed (asthma). For Asthma. May use every 2 hours for attacks, or pereventively before exercise.  1 Inhaler  6   . Certolizumab Pegol (CIMZIA PREFILLED) 2 X 200 MG/ML Subcutaneous Kit Inject 200 mg under the skin every 2 weeks.       . Clotrimazole-Betamethasone 1-0.05 % External Cream Apply 1 application topically 2 times a day as needed (rash). Apply to buttocks  45 g  2   . Cyclobenzaprine HCl (FLEXERIL) 10 MG Oral Tab Take 1 tablet by mouth 3 times per day as needed for muscle pain  60 Tab  1   . DULoxetine HCl 60 MG Oral CAPSULE ENTERIC COATED PARTICLES  0   . Esomeprazole Magnesium 40 MG Oral CAPSULE DELAYED RELEASE One tablet twice per day on empty stomach  180 capsule  1   . Fexofenadine-Pseudoephed ER 60-120 MG Oral TABLET SR 12 HR Take 1 tablet by mouth every 12 hours as needed for allergies.  30 tablet  1   . Fluticasone-Salmeterol 500-50 MCG/DOSE Inhalation AEROSOL POWDER, BREATH ACTIVATED Inhale 1 puff by mouth 2 times a day.  1 Device  1   . Hydrocodone-Acetaminophen 10-325 MG Oral Tab take 1 to 2 tablets by mouth every 6 hours if needed for pain , maximum daily dose of 8 tablets  30 tablet  0   . Levothyroxine Sodium 75 MCG Oral Tab Take 1 tablet (75 mcg) by mouth daily.  90 tablet  3   . Lorazepam 2 MG Oral Tab 1 TABLET TWICE DAILY       . Losartan Potassium 50 MG Oral Tab 1 po qd  30 tablet  5   .  LYRICA 75 MG Oral Cap        . Meloxicam 15 MG Oral Tab     0   . Montelukast Sodium 10 MG Oral Tab Take 1 tablet (10 mg) by mouth every evening.  90 tablet  1   . Promethazine-Codeine 6.25-10 MG/5ML Oral Syrup Take 5 mL by mouth every 6 hours as needed for allergies (cough). Take 1 or 2 teaspoons every 6 hrs as needed  240 mL  1   . Respiratory Therapy Supplies (NEBULIZER) Does not apply Device Use every 6 hours as needed for other (chronic bronchitis). Chronic bronchitis  1 Device  0     No facility-administered medications prior to visit.       Review of patient's allergies indicates:  Allergies   Allergen Reactions   . Morphine Nausea/Vomiting   . Penicillins Rash       Family History   No significant family history.     Social History  Lives in [place], [place].   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   Tobacco use: Smoked for 2 years, 1 pack/week remotely, none currently.   Tobacco exposure: No  No ETOH, illicits      Review of Systems  Gen: no fevers, chills, night sweats, weight loss  Skin: no new rashes  Endocrine: denies diabetes, denies thyroid disease  ENT: denies epistaxis, purulent nasal drainage, frequent sinus infections, frequent ear infections  Resp: denies history of pneumonia, hemoptysis, exposure to Tb, SOB  Cardiovascular: denies hypertension, chest pain, palpitations  GI: denies diarrhea, blood in stool, abd pain  GU: denies dysuria, hematuria  Musculoskeletal: denies arthritis, fractures  Hematologic: denies easy bruising, bleeding, anemia  Allergic: see HPI  Psychiatric: euthymic      All other systems reviewed were negative.    Physical exam:   BP 147/99  Pulse 107  Temp(Src) 97.2 F (36.2 C) (Temporal)  Ht _0  (1.778 m)  Wt 259 lb (117.482 kg)  BMI 37.16 kg/m2  SpO2 97%  Vital signs were reviewed and showed tachycardia.   General Appearance:  Healthy  Eyes: PERRL, conjunctivae noninjected, no cobblestoning of  conjunctivae  Ears: TMs clear, normal light reflex, nonretracted, no erythema of ear canals,   Face: Maxillary sinus areas nontender to light percussion,   Nose: Nares with normal mucosa, no septal ulceration, no purulence or polyps visualized;   Mouth: Moist mucosa, no cobblestoning of posterior pharynx, no mucous strands  visibly adherent to the posterior pharynx  Neck: supple, no thyromegaly, no cervical or supraclavicular adenopathy  Heart: Tachycardic, no murmers, rubs, or gallops; radial pulses 2+ and equal  Lungs: Clear to ascultation, no wheezing, rhonchi, crackles with normal expiratory phase  Abdomen: bowel sounds normoactive, no hepatosplenomegaly, nontender  Extremities: no cyanosis, clubbing or edema  Skin: warm and dry, no rash, no urticaria, no dermographism  Neuro: Alert, no tremor, cooperative, normal gait    Spirometry 08/05/2013     Spirometry was performed to evaluate dyspnea symptoms.      Pre-Bronchodilator Post-Bronchodilator %Change  FVC:  4.61 (93%)   -  FEV1:  3.57 (90%)   -  FEV1/FVC:  0.77 (97%)   -    Impression of spirometry:  No signs of obstruction, with normal flow-volume loops.     Assessment  Jeric Slagel is a 48 year old male with history of asthma, who presents to clinic today with persistent shortness of breath, cough with sputum production and malaise since January. The etiology of his symptoms is not entirely clear at this point. Most likely, this represents a prolonged exacerbation of asthma after a presumed viral illness in January. However, given his history of immunosuppresion on certolizumab and the fact that his symptoms are persistent for longer than expected with previously clear chest x-ray, this would raise suspicion for another potential etiology of his symptoms. His normal office spirometry and lack of wheezing on exam may argue for another etiology of his symptoms. Atypical infection may be more likely given his immunosuppression. Therefore, we will repeat a  CXR today, given that previous CXR may have been too early in course to detect ongoing process.    Plan:  -CXR today  If CXR normal, will treat empirically for asthma exacerbation with the following plan:    -Start prednisone 40 mg daily for 14 days  -Combine formoterol, budesonide, tiotropium into liquid solution and administer in nebulized form twice daily for the next 2 weeks  -Continue to use albuterol as needed for shortness of breath    This patient was seen and discussed with Dr. Cristie Hem.

## 2013-08-05 NOTE — Progress Notes (Signed)
[facility]  Pulmonary Clinic  Pulmonary Functions Testing Data          GENDER: Male  AGE: 48 year old   HEIGHT: 3670"      Results as follows:       Predicted Best  % of Pred.     FVC (L)  4.97  4.61  93%    FEV1 (L)  3.98  3.57  90%    FEV1/FVC  0.80  0.77  97%    FEF 25-75% (L/s) 3.90  3.06  79%    PEFR (L/s)  9.12  9.51  104%    Comments: Patient performed testing well. Bronchodilator taken about 9 hours prior to this test.

## 2013-08-05 NOTE — Telephone Encounter (Signed)
Form located and patient contacted and form finished and faxed, patient notified

## 2013-08-05 NOTE — Patient Instructions (Signed)
For your acute exacerbation, we are going to recommend the following treatment:    -Take prednisone 40 mg for the next 2 weeks  -Combine formoterol, budesonide and tiotropium together and breathe in this nebulized mixture twice per day  -Do not use Advair during this time  -Continue to use albuterol as needed for shortness of breath

## 2013-08-05 NOTE — Telephone Encounter (Signed)
Clinic notified. Referral was originally directed to [facility] but [facility]

## 2013-08-08 ENCOUNTER — Encounter (HOSPITAL_BASED_OUTPATIENT_CLINIC_OR_DEPARTMENT_OTHER): Payer: Self-pay

## 2013-08-08 NOTE — Progress Notes (Signed)
I saw and evaluated Anthony KeysMartin Andrade with Dr. Wilburt FinlayAndrew Harris. I agree with his history, assessment and plan. I agree with the spirometry and interpretation of spirometry.    Christianne BorrowLeonard Dao Mearns MD

## 2013-08-12 ENCOUNTER — Encounter (INDEPENDENT_AMBULATORY_CARE_PROVIDER_SITE_OTHER): Payer: Self-pay | Admitting: Family

## 2013-08-13 ENCOUNTER — Encounter (INDEPENDENT_AMBULATORY_CARE_PROVIDER_SITE_OTHER): Payer: Self-pay | Admitting: Family

## 2013-08-13 ENCOUNTER — Telehealth (HOSPITAL_BASED_OUTPATIENT_CLINIC_OR_DEPARTMENT_OTHER): Payer: Self-pay | Admitting: Internal Medicine

## 2013-08-13 NOTE — Telephone Encounter (Signed)
See ecare message about patient FMLA paperwork

## 2013-08-13 NOTE — Telephone Encounter (Signed)
CONFIRMED PHONE NUMBER: [phone]  CALLERS FIRST AND LAST NAME: Anthony Andrade  FACILITY NAME: na TITLE: na  CALLERS RELATIONSHIP:Self  RETURN CALL: Detailed message on voicemail only     SUBJECT: General Message   REASON FOR REQUEST:   Patient is calling with two requests:    1) Patient would like a letter to give to him employer for missed work due to his illness. He was out of work from 08/05/13 to 08/13/13. He will return to work on Monday 08/16/13. Please make the letter available on eCare.    2) Patient has been using the medication prescribed by the doctor to improve his asthma and breathing. His symptoms have improved slightly but not entirely. He is wondering if there is any other medications he could try to get more relief.      Thank you.

## 2013-08-13 NOTE — Telephone Encounter (Signed)
Routing pts ecare msg to Fonnie MuKerry Meyer, ARNP to advise re: FMLA.

## 2013-08-16 ENCOUNTER — Other Ambulatory Visit (INDEPENDENT_AMBULATORY_CARE_PROVIDER_SITE_OTHER): Payer: Self-pay | Admitting: Family

## 2013-08-16 ENCOUNTER — Telehealth (INDEPENDENT_AMBULATORY_CARE_PROVIDER_SITE_OTHER): Payer: Self-pay | Admitting: Family

## 2013-08-16 DIAGNOSIS — R059 Cough, unspecified: Secondary | ICD-10-CM

## 2013-08-16 DIAGNOSIS — J4541 Moderate persistent asthma with (acute) exacerbation: Secondary | ICD-10-CM

## 2013-08-16 MED ORDER — PROMETHAZINE-CODEINE 6.25-10 MG/5ML OR SYRP
5.0000 mL | ORAL_SOLUTION | Freq: Four times a day (QID) | ORAL | Status: DC | PRN
Start: 2013-08-16 — End: 2013-09-09

## 2013-08-16 NOTE — Telephone Encounter (Signed)
MAs, Please find pts FMLA paperwork and have a [facility] he specified   Thanks

## 2013-08-16 NOTE — Telephone Encounter (Signed)
Defer to KM

## 2013-08-16 NOTE — Telephone Encounter (Signed)
Forwarding to the Team Pool to locate the patients FMLA paperwork.

## 2013-08-16 NOTE — Telephone Encounter (Signed)
CONFIRMED PHONE NUMBER: [phone]  CALLERS FIRST AND LAST NAME: Anthony Andrade  FACILITY NAME: na/ TITLE: n/a  CALLERS RELATIONSHIP:Self  RETURN CALL: Detailed message on voicemail only     SUBJECT: General Message   REASON FOR REQUEST: please call the patient and let him know if the FMLA paperwork has been completed and faxes.     MESSAGE: see above

## 2013-08-16 NOTE — Telephone Encounter (Signed)
Patient requesting medication refill.    Medication Refill Documentation  Name of Medication Promethazine-Codeine 6.25-10 MG/5ML Oral Syrup  Prescribing Provider Fonnie MuKerry Meyer, ARNP  Date last filled 07/28/2013  Date last seen for this issue 20/09/2013  Pharmacy loaded: Yes

## 2013-08-16 NOTE — Telephone Encounter (Signed)
Please fax the cough medication to [name]'s pharmacy once signed today (Tuesday)    Thank you

## 2013-08-17 ENCOUNTER — Other Ambulatory Visit (HOSPITAL_BASED_OUTPATIENT_CLINIC_OR_DEPARTMENT_OTHER): Payer: Self-pay | Admitting: Allergy & Immunology

## 2013-08-17 ENCOUNTER — Telehealth (HOSPITAL_BASED_OUTPATIENT_CLINIC_OR_DEPARTMENT_OTHER): Payer: Self-pay | Admitting: Allergy & Immunology

## 2013-08-17 DIAGNOSIS — R059 Cough, unspecified: Secondary | ICD-10-CM

## 2013-08-17 DIAGNOSIS — J4541 Moderate persistent asthma with (acute) exacerbation: Secondary | ICD-10-CM

## 2013-08-17 DIAGNOSIS — J45901 Unspecified asthma with (acute) exacerbation: Secondary | ICD-10-CM

## 2013-08-17 NOTE — Telephone Encounter (Signed)
*  Escalated call     Wife called and was upset because patient has not heard back from clinic regarding symptoms/medication. Initial contact occurred 2/20.  Patient is still experiencing bad symptoms.  The medication prescribed on 2/20 is not helping much.  They are concerned patient may need a different medication and would like advice on how to proceed.  Please contact patient as soon as possible.  Wife was given the CCL number for assistance with symptoms this evening. Thank you. [phone]

## 2013-08-17 NOTE — Telephone Encounter (Signed)
I called Anthony Andrade to discuss his persistent cough. He was seen in Allergy/Immunology clinic on 08/05/13. See notes from that day for details. She has had no improvement in his cough despite 14 days of prednisone and bid nebulized steroid and bronchodilators. He has been coughing since early January and is producing some yellow sputum esp in the mornings. We will schedule him back in clinic for re-evaluation. Given his underling ankylosing spondylitis and immunosuppression I am going to have him scheduled for a high resolution chest CT.

## 2013-08-17 NOTE — Telephone Encounter (Signed)
Patient would like someone to call him regarding his issue listed below please advise

## 2013-08-17 NOTE — Telephone Encounter (Signed)
Please find Rx for cough medicine and fax to pharmacy.    Routing to [employer].

## 2013-08-17 NOTE — Telephone Encounter (Signed)
Error

## 2013-08-17 NOTE — Telephone Encounter (Signed)
RX faxed to pharmacy.

## 2013-08-18 NOTE — Telephone Encounter (Signed)
The medication requested requires your approval because it is not on [facility] protocol: Benzonatate that was last filled on 08/05/13.    OK?  If not approved please notify patient.

## 2013-08-18 NOTE — Telephone Encounter (Signed)
Nash DimmerKerry, I can't locate his FMLA paperwork, was this completed?

## 2013-08-18 NOTE — Telephone Encounter (Signed)
See TC dated 2/24 by Dr. Roselee NovaALtman. Pt to be scheduled today for Friday.

## 2013-08-18 NOTE — Telephone Encounter (Signed)
Asking patient for more information. May need a new form.

## 2013-08-19 MED ORDER — BENZONATATE 100 MG OR CAPS
ORAL_CAPSULE | ORAL | Status: DC
Start: 2013-08-16 — End: 2013-10-10

## 2013-08-19 NOTE — Telephone Encounter (Signed)
Received form from wife. Will complete

## 2013-08-19 NOTE — Telephone Encounter (Signed)
Please see patient eCare message.    Routing to Kerry Meyer

## 2013-08-20 ENCOUNTER — Encounter (HOSPITAL_BASED_OUTPATIENT_CLINIC_OR_DEPARTMENT_OTHER): Payer: Self-pay | Admitting: Allergy & Immunology

## 2013-08-20 ENCOUNTER — Ambulatory Visit (HOSPITAL_BASED_OUTPATIENT_CLINIC_OR_DEPARTMENT_OTHER): Payer: BLUE CROSS/BLUE SHIELD | Admitting: Allergy & Immunology

## 2013-08-20 ENCOUNTER — Other Ambulatory Visit (HOSPITAL_BASED_OUTPATIENT_CLINIC_OR_DEPARTMENT_OTHER): Payer: Self-pay | Admitting: Allergy & Immunology

## 2013-08-20 ENCOUNTER — Ambulatory Visit: Payer: BLUE CROSS/BLUE SHIELD | Attending: Allergy & Immunology

## 2013-08-20 VITALS — BP 147/90 | HR 93 | Temp 97.2°F | Ht 70.0 in | Wt 259.0 lb

## 2013-08-20 DIAGNOSIS — J309 Allergic rhinitis, unspecified: Secondary | ICD-10-CM

## 2013-08-20 DIAGNOSIS — R0989 Other specified symptoms and signs involving the circulatory and respiratory systems: Secondary | ICD-10-CM

## 2013-08-20 DIAGNOSIS — R059 Cough, unspecified: Secondary | ICD-10-CM

## 2013-08-20 DIAGNOSIS — J4541 Moderate persistent asthma with (acute) exacerbation: Secondary | ICD-10-CM

## 2013-08-20 DIAGNOSIS — J4521 Mild intermittent asthma with (acute) exacerbation: Secondary | ICD-10-CM

## 2013-08-20 DIAGNOSIS — R0609 Other forms of dyspnea: Secondary | ICD-10-CM | POA: Insufficient documentation

## 2013-08-20 DIAGNOSIS — M459 Ankylosing spondylitis of unspecified sites in spine: Secondary | ICD-10-CM | POA: Insufficient documentation

## 2013-08-20 DIAGNOSIS — J45901 Unspecified asthma with (acute) exacerbation: Secondary | ICD-10-CM | POA: Insufficient documentation

## 2013-08-20 LAB — CBC, DIFF
% Basophils: 0 %
% Eosinophils: 1 %
% Immature Granulocytes: 1 %
% Lymphocytes: 32 %
% Monocytes: 8 %
% Neutrophils: 58 %
Absolute Eosinophil Count: 0.12 10*3/uL (ref 0.00–0.50)
Absolute Lymphocyte Count: 4.4 10*3/uL (ref 1.00–4.80)
Basophils: 0.05 10*3/uL (ref 0.00–0.20)
Hematocrit: 42 % (ref 38–50)
Hemoglobin: 14.6 g/dL (ref 13.0–18.0)
Immature Granulocytes: 0.16 10*3/uL — ABNORMAL HIGH (ref 0.00–0.05)
MCH: 31.4 pg (ref 27.3–33.6)
MCHC: 35.1 g/dL (ref 32.2–36.5)
MCV: 90 fL (ref 81–98)
Monocytes: 1.15 10*3/uL — ABNORMAL HIGH (ref 0.00–0.80)
Neutrophils: 7.8 10*3/uL — ABNORMAL HIGH (ref 1.80–7.00)
Platelet Count: 186 10*3/uL (ref 150–400)
RBC: 4.65 mil/uL (ref 4.40–5.60)
RDW-CV: 12.5 % (ref 11.6–14.4)
WBC: 13.68 10*3/uL — ABNORMAL HIGH (ref 4.3–10.0)

## 2013-08-20 LAB — LAB UNDEFINED ORCA/EPIC ORDER

## 2013-08-20 MED ORDER — IPRATROPIUM-ALBUTEROL 20-100 MCG/ACT IN AERS
2.0000 | INHALATION_SPRAY | RESPIRATORY_TRACT | Status: DC | PRN
Start: 2013-08-20 — End: 2015-08-09

## 2013-08-20 MED ORDER — OLOPATADINE HCL 0.6 % NA SOLN
2.0000 | Freq: Two times a day (BID) | NASAL | Status: DC
Start: 2013-08-20 — End: 2014-07-26

## 2013-08-20 MED ORDER — CLARITHROMYCIN 500 MG OR TABS
500.0000 mg | ORAL_TABLET | Freq: Two times a day (BID) | ORAL | Status: DC
Start: 2013-08-20 — End: 2013-09-09

## 2013-08-20 MED ORDER — BUDESONIDE 0.5 MG/2ML IN SUSP
0.5000 mg | Freq: Two times a day (BID) | RESPIRATORY_TRACT | Status: DC
Start: 2013-08-20 — End: 2014-07-26

## 2013-08-20 MED ORDER — BENZONATATE 200 MG OR CAPS
200.0000 mg | ORAL_CAPSULE | Freq: Three times a day (TID) | ORAL | Status: DC | PRN
Start: 2013-08-20 — End: 2013-10-10

## 2013-08-20 MED ORDER — AEROCHAMBER Z-STAT PLUS MISC
1.0000 | Status: DC
Start: 2013-08-20 — End: 2017-08-28

## 2013-08-20 MED ORDER — FORMOTEROL FUMARATE 20 MCG/2ML IN NEBU
2.0000 mL | INHALATION_SOLUTION | Freq: Two times a day (BID) | RESPIRATORY_TRACT | Status: DC
Start: 2013-08-20 — End: 2014-07-26

## 2013-08-20 NOTE — Patient Instructions (Addendum)
Continue use nebulized budesonide and formoterol twice each day.    Start using Combivent (ipratroprium/albuterol) inhaler every 4-6 hours during the day for cough.    Take clarithromycin 500mg  twice each day.    Continue using tessalon for cough.    Start using flonase and patanase nasal sprays twice per day.    Stop using predisone.    Stop using Cimzia for now.    I will be in contact with you next week once I have results back from your blood work.    Please let me know if you have questions and thank you for coming in today.    Gardiner CoinsMatthew Yatzil Clippinger, MD  [place] [facility]  Allergy and Immunology

## 2013-08-20 NOTE — Progress Notes (Signed)
Allergy and Immunology Follow up Note    Reason for Visit:   I am seeing Anthony Andrade back in Allergy/Immunology clinic for ongoing management of cough.    He was first seen in this clinic earlier this month by Dr. Wilburt Finlay and Dr. Christianne Borrow.    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 He was referred to this clinic initially for ongoing cough and shortness of breath by his NP primary care.     As this is my first time seeing him and he is new to this clinic I reviewed his relevant immune/asthma history in detail. He tells me he has had AS since being a teenager/early 20s. It sounds like this was based on back pain and abnormalities seen in his SI joints. He has been treated with numerous different anti-TNF therapies, generally with minimal improvement, though since August 2014 has been on Cimzia which has been useful for his back pain.     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 He tells me he first developed exercise induced bronchospasm about 15 years ago. This has generally been quite mild and managed with albuterol prior to exercise only. However over the last 3-4 years his pulmonary symptoms have been more problematic. Each winter for the past several years he has developed a cough that takes several months to improve. In past years he has seen several specialists including Dr. Purcell Mouton, [facility]ist [facility]. There he was skin tested and per patient report was positive to numerous aeroallergens, though he does not recall what. He saw a pulmonologist about 2 years ago and no specific lung pathology was found. Generally this episodes of cough start with sinus congestion  leading to sensation of throat discomfort followed by a persistent, non-produtcive or minimally productive cough that persists for months. He has been treated intermittently with antibiotics including azithromycin and levoquin without improvement, as well as steroids without improvement. No etiology is ever determined, after several months the cough clears. Cough suppressants have minimal effect. He ends up missing many weeks-months of work as results.    This year he was doing well in terms of respiratory symptoms until late December when he developed what sounds like a URI. Since then he has had persistent cough, initially with some green/brown sputum, but now clear. He also complains of dyspnea, but this is really only while coughing, no frank wheeze. The cough does make it hard to fall asleep but generally is not waking him up at night.  He went to his PCP during the first week of January and was initially prescribed prednisone for 6 days, azithromycin and albuterol inhaler. He reports minimal improvement in his symptoms initially and returned to his PCP, again receiving 4 days of prednisone and prescribed levofloxacin. He feels that he did not have much improvement in his symptoms. He returned to his PCP and had a CXR on 1/16, which was clear at that point without signs of pneumonia. He came to this clinic on 08/05/13 and had spirometry that was normal and a CXR that was clear. He was treated with 14 days of prednisone 40mg  as well as bid inhaled formoterol, budesonide, and daily tiotropium but has not felt this  has changed his cough at all. He is using hydrocodone containing cough medicine and tessalon 3x per day with some help.     He called our clinic last week and told me his breathing and cough were very severe and he was concerned that this was not improving. Given his immune suppressed state he and his wife were appropriately concerned about an infection. He was thus urgently scheduled for return. I had  him have a CT scan this morning of his chest which shows no lung pathology. He also had a CBC and diff showing a slightly increased WBC, ANC, and AMC. 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 He says he has missed 7 out of 8 weeks of work this year. He has asked me if I can provide him documentation as to his need to miss work, I told him I can only provide it for today, but not for other missed work. He says he adamantly wants to return to work, but feels unable to do so on account of his cough.     He is on Cimizia q2wks, though has been intermittently holding it during this period of cough. He did not use it during January, but did use an injection on February 15.     When healthy he also takes cymbalta, lyrica, nexium, levothyroxine, losartan, and lorazepam.      Review of Systems/Symptoms:  Review of systems is as per HPI and as documented in this note and copied below:  Constitutional: No fevers, chills, malaise, and weight loss.  Eyes: Negative for eye pain and worsening vision.  Ears, Nose, Mouth, Throat: Negative for epistaxis, sinus problems, sore throat and hoarseness.  Cardiovascular: Negative for chest pain, palpitations, orthopnea, and paroxysmal nocturnal dyspnea.  Respiratory: As per HPI  Gastrointestinal: Negative for abdominal pain, vomiting, diarrhea.  Genitourinary: Negative for dysuria.  Musculoskeletal: Chronic back and joint pains, unchanged from baseline.  Skin: Negative for hives, pruritis, rash, and skin breakdown.  Neurological: Negative for headaches and weakness.  Endocrine: Negative for temperature intolerance.  Hematologic/Lymphatic: Negative for adenopathy and fatigue.  Allergic/Immunologic: As per HPI.      Past Medical History:  Past Medical History   Diagnosis Date   . Ankylosing spondylitis    . Esophageal reflux    . Allergic rhinitis due to other allergen    . Unspecified sleep apnea    . Restless legs syndrome (RLS)         Medications:  Outpatient Prescriptions Prior to Visit   Medication Sig Dispense Refill   . Albuterol Sulfate (2.5 MG/3ML) 0.083% Inhalation Nebu Soln Inhale 3 mL (2.5 mg) via nebulizer every 6 hours as needed for shortness of breath/wheezing.  100 vial  1   . Albuterol Sulfate HFA (PROAIR HFA) 108 (90 BASE) MCG/ACT Inhalation Aero Soln Inhale 2 puffs by mouth every 4 hours as needed (asthma). For Asthma. May use every 2 hours for attacks, or pereventively before exercise.  1 Inhaler  6   . Benzonatate (TESSALON) 200 MG Oral Cap Take 1 capsule (200 mg) by mouth 3 times a day as needed for cough.  30 capsule  0   . Benzonatate 100 MG Oral Cap take 1 or 2 capsules every 8 hours if needed for cough  42 capsule  0   . Certolizumab Pegol (CIMZIA PREFILLED) 2 X 200 MG/ML Subcutaneous Kit Inject 200 mg under the skin every 2 weeks.       Marland Kitchen  Clotrimazole-Betamethasone 1-0.05 % External Cream Apply 1 application topically 2 times a day as needed (rash). Apply to buttocks  45 g  2   . Cyclobenzaprine HCl (FLEXERIL) 10 MG Oral Tab Take 1 tablet by mouth 3 times per day as needed for muscle pain  60 Tab  1   . DULoxetine HCl 60 MG Oral CAPSULE ENTERIC COATED PARTICLES     0   . Esomeprazole Magnesium 40 MG Oral CAPSULE DELAYED RELEASE One tablet twice per day on empty stomach  180 capsule  1   . Fexofenadine-Pseudoephed ER 60-120 MG Oral TABLET SR 12 HR Take 1 tablet by mouth every 12 hours as needed for allergies.  30 tablet  1   . Fluticasone-Salmeterol 500-50 MCG/DOSE Inhalation AEROSOL POWDER, BREATH ACTIVATED Inhale 1 puff by mouth 2 times a day.  1 Device  1   . Formoterol Fumarate 20 MCG/2ML Inhalation Nebu Soln Inhale 2 mL via nebulizer 2 times a day.  60 mL  0   . Hydrocodone-Acetaminophen 10-325 MG Oral Tab take 1 to 2 tablets by mouth every 6 hours if needed for pain , maximum daily dose of 8 tablets  30 tablet  0   . Levothyroxine Sodium 75 MCG Oral Tab Take 1 tablet (75 mcg) by mouth daily.  90 tablet  3   .  Lorazepam 2 MG Oral Tab 1 TABLET TWICE DAILY       . Losartan Potassium 50 MG Oral Tab 1 po qd  30 tablet  5   . LYRICA 75 MG Oral Cap        . Meloxicam 15 MG Oral Tab     0   . Montelukast Sodium 10 MG Oral Tab Take 1 tablet (10 mg) by mouth every evening.  90 tablet  1   . PredniSONE 20 MG Oral Tab Take 2 tablets (40 mg) by mouth daily.  28 tablet  0   . Promethazine-Codeine 6.25-10 MG/5ML Oral Syrup Take 5 mL by mouth every 6 hours as needed for allergies (cough). Take 1 or 2 teaspoons every 6 hrs as needed  240 mL  1   . Respiratory Therapy Supplies (NEBULIZER) Does not apply Device Use every 6 hours as needed for other (chronic bronchitis). Chronic bronchitis  1 Device  0   . Tiotropium Bromide Monohydrate 18 MCG Inhalation Cap Inhale contents of 1 capsule (18 mcg) by mouth daily. Inhale contents of capsule.  30 capsule  0     No facility-administered medications prior to visit.   I reviewed his medications, he has been compliant with prednisone, the nebulized steroids and bronchodilators, and the montelukast.    Allergies:  Review of patient's allergies indicates:  Allergies   Allergen Reactions   . Morphine Nausea/Vomiting   . Penicillins Rash       Social History:  History     Social History   . Marital Status: Married     Spouse Name: N/A     Number of Children: N/A   . Years of Education: N/A     Occupational History   . Not on file.     Social History Main Topics   . Smoking status: Former Research scientist (life sciences)   . Smokeless tobacco: Not on file   . Alcohol Use: No   . Drug Use: No   . Sexual Activity: Not on file     Other Topics Concern   . Not on file     Social History Narrative  Recently moved from [place].  Works for [employer].  One daughter.  Sister is an Therapist, sports. 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    Physical Exam:  BP 147/90  Pulse 93   Temp(Src) 97.2 F (36.2 C) (Temporal)  Ht _0  (1.778 m)  Wt 259 lb (117.482 kg)  BMI 37.16 kg/m2  SpO2 97%  PHYSICAL EXAM:  General: healthy, alert, no distress.He coughs a harsh sounding dry cough frequently during the period of exam, with no sputum. However while not fixated on talking about the cough he could go for long periods without coughing.  Neuro: Alert, good medical historian. Normal gait.  Psych: Normal affect.  Skin: No hives, dermatographism, eczema, or other rashes.  Head: Normocephalic, no dysmorphism.  Eyes: Lids/periorbital skin normal, Conjunctivae clear.  Ears: External ears normal. Canals clear. TM's normal.  Nose: Nares patent, no edema, no erythema, no ulceration. Some white mucus in the anterior nasopharynx. No tenderness over the sinuses.  Oropharynx: Lips, mucosa, and tongue without erythema or edema. Posterior pharynx without erythema, exudate or drainage.  Neck: No adenopathy. Full range of motion. No tenderness.  Lungs: Good air movement throughout all lung fields, clear to auscultation, no wheezes, no crackles, no rubs.  Heart: normal rate, regular rhythm and no rubs.  Ext: Warm and well perfused. No edema. No clubbing.   Msk: No effusions appreciated in the joints of the fingers, hands, wrists. No nail changes.    Review of Results:  In anticipation of his sick visit today I ordered a chest CT and labs prior to the clinic visit. These are reviewed here.  I called the radiologist to review his chest CT. There is no lung pathology. There are 2 small fissural lymph nodes seen. There is hepatic steatosis as an incidental finding.  I reviewed his labs, his CBC is normal other than a mildly increased WBC. IgG, A, M are normal. IgE is 26.     Assessment:  # Persistent cough  # Allergic rhinitis  # Possible Exercise induced asthma    Discussion: Mr. Harshberger is a 48yo with a reported history of allergic rhinitis and mild intermittent asthma now with over 2 months of persist cough which  he is very problematic to him. Given his reported severe dyspnea when I spoke with him by phone, and his immunosuppression with anti-TNF, along with lack of response to both antibiotics and steroids, I felt an expanded workup was indicated, in particular a chest CT and humoral immune assessment. Fortunately these are all normal. His chest CT effectively rules out lung pathologies including lung infection and interstitial lung disease. His CBC and immunoglobulins are very reassuring that we are not dealing with an immune deficiency. Similarly his lack of response to steroids and bronchodilators and his normal spirometry argue against asthma. Thus I am left to think his cough is likely related to an upper respiratory process. This may be persistent sinus discharge/inflammation leading to post nasal drip, though lack of response to steroids would argue against that. It is worth keeping in mind the possibility of tracheal disease such as tracheomalacia as his presentation of viral URI induced persistent cough could fit that. Diagnosis would required direct visualization by laryngoscopy or else an inspiratory/experiatory CT of the neck. He may also have a persistent upper airway infection such  as pertussis or a persistent viral infection related to his underlying immunosuppression. However I think at least some of his cough at this point may be habit cough or irritation induced from a prior upper respiratory infection that he has now cleared. I have suggested stopping immunosupression (corticosteroid and anti-TNF) and trying conservative cough management for the time being.    Plan:  # Stop prednisone  # Hold Cimzia until cough resolves  # Okay to continue inhaled budesonide/formoterol bid for now, however may advise him to stop these shortly as well  # Stop tiotropium  # Continue tessalon 264m tid  # Start combivent qid as a cough suppressant  # Start flonase (which he has) and patanase bid to help manage sinsuses  #  Will empirically treat with 10 days of clarithromycin incase of pertussis infection  # Check pertussis IgG, IgA  # Follow up pending diphtheria titers    Mr. ACriadowill follow up with me in 7-10 days to reassess symptoms. And we will determine clinic follow up based on that.     It was a pleasure seeing [facility] please do not hesitate to contact me with any questions.    MGlennie Hawk MD

## 2013-08-23 LAB — IMMUNOGLOBULIN E: Immunoglobulin E: 26 U/mL (ref 0–300)

## 2013-08-23 LAB — IMMUNOGLOBULINS A,G,M
Immunoglobulin A: 338 mg/dL (ref 65–420)
Immunoglobulin G: 818 mg/dL (ref 620–1490)
Immunoglobulin M: 80 mg/dL (ref 40–350)

## 2013-08-24 LAB — MICROBIOLOGY/SEROLOGY SENDOUT

## 2013-08-25 LAB — DIPHTHERIA IGG ANTIBODY (SENDOUT): Diphtheria IgG Antibody (Sendout): 1.4 IU/mL

## 2013-08-26 ENCOUNTER — Encounter (HOSPITAL_BASED_OUTPATIENT_CLINIC_OR_DEPARTMENT_OTHER): Payer: Self-pay | Admitting: Allergy & Immunology

## 2013-08-30 ENCOUNTER — Telehealth (HOSPITAL_BASED_OUTPATIENT_CLINIC_OR_DEPARTMENT_OTHER): Payer: Self-pay | Admitting: Allergy & Immunology

## 2013-08-30 NOTE — Telephone Encounter (Signed)
Still coughing, SOB, feeling fatigue and exhausted.  Wants to know the results of his tests.   Please call patient.  Thank you.

## 2013-08-30 NOTE — Telephone Encounter (Signed)
Spoke with Pt who would like to speak with Dr. Roselee NovaAltman regarding his continuing symptoms and to review his recent labs. I offered him an appointment Tuesday with Dr. Roselee NovaAltman and he would like to. I scheduled him for a 3:30pm appt.

## 2013-08-31 ENCOUNTER — Ambulatory Visit: Payer: BLUE CROSS/BLUE SHIELD | Attending: Allergy & Immunology | Admitting: Allergy & Immunology

## 2013-08-31 ENCOUNTER — Encounter (HOSPITAL_BASED_OUTPATIENT_CLINIC_OR_DEPARTMENT_OTHER): Payer: Self-pay | Admitting: Allergy & Immunology

## 2013-08-31 VITALS — BP 147/98 | HR 115 | Temp 95.7°F | Ht 70.0 in | Wt 260.4 lb

## 2013-08-31 DIAGNOSIS — J45901 Unspecified asthma with (acute) exacerbation: Secondary | ICD-10-CM

## 2013-08-31 DIAGNOSIS — R059 Cough, unspecified: Secondary | ICD-10-CM | POA: Insufficient documentation

## 2013-08-31 DIAGNOSIS — J4541 Moderate persistent asthma with (acute) exacerbation: Secondary | ICD-10-CM

## 2013-08-31 DIAGNOSIS — J309 Allergic rhinitis, unspecified: Secondary | ICD-10-CM | POA: Insufficient documentation

## 2013-08-31 NOTE — Progress Notes (Signed)
Allergy and Immunology Follow up Note    Reason for Visit:   I am seeing Malcolm Quast Pautz back in Allergy/Immunology clinic for ongoing management of cough.    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 He was referred to this clinic initially for ongoing cough and shortness of breath by his NP primary care.     Full details of his relevant HPI are in my note from 08/26/13. At last visit his lung CT was clear. Blood work from last visit showed normal quantitative immunoglobulins and diphtheria titer ruling out a humoral immunodeficiency. Bortadella serology was negative for this infection. I feel that his persistent cough may be either related to a persistent upper airway viral infection that persists at least in part due to his anti-TNF therapy, or else at this point may be habit cough or irritation induced from a prior upper respiratory infection that he has now cleared.    At last visit I recommended combivent q4hr during the day, tessalon, and patanase. He has found the combivent quite effective for suppressing his cough, but symptoms return about 4 hours after each use. He was been carrying it with him and using q4-6hr during the day with good effect. He has tried to walk more, walking about 10-70mnutes per day for exercise, and can generally tolerate this though feels tired/out of breath at the end.     He has not yet returned to work, but plans to do so in the immediate future.       Review of Systems/Symptoms:  Review of systems is as per HPI and as documented in this note and copied below:  Constitutional: No fevers, chills, malaise, and weight loss.  Eyes: Negative for eye pain and worsening vision.  Ears, Nose, Mouth, Throat: Negative for epistaxis,  some streaked blood in nasal mucus, negative for hoarseness.  Cardiovascular: Negative for chest pain, palpitations, orthopnea, and paroxysmal nocturnal dyspnea.  Respiratory: As per HPI  Gastrointestinal: Negative for abdominal pain, vomiting, diarrhea.  Musculoskeletal: Chronic back and joint pains, unchanged from baseline.  Skin: Negative for hives, pruritis, rash, and skin breakdown.  Neurological: Negative for headaches and weakness.  Hematologic/Lymphatic: Negative for adenopathy and fatigue.  Allergic/Immunologic: As per HPI.      Past Medical History:  Past Medical History   Diagnosis Date   . Ankylosing spondylitis    . Esophageal reflux    . Allergic rhinitis due to other allergen    . Unspecified sleep apnea    . Restless legs syndrome (RLS)        Medications:  Outpatient Prescriptions Prior to Visit   Medication Sig Dispense Refill   . Albuterol Sulfate (2.5 MG/3ML) 0.083% Inhalation Nebu Soln Inhale 3 mL (2.5 mg) via nebulizer every 6 hours as needed for shortness of breath/wheezing.  100 vial  1   . Albuterol Sulfate HFA (PROAIR HFA) 108 (90 BASE) MCG/ACT Inhalation Aero Soln Inhale 2 puffs by mouth every 4 hours as needed (asthma). For Asthma. May use every 2 hours for attacks, or pereventively before exercise.  1 Inhaler  6   . Benzonatate (TESSALON) 200 MG Oral Cap Take 1 capsule (200 mg) by mouth 3 times a day as needed for cough.  240 capsule  2   . Benzonatate 100 MG Oral Cap take 1 or 2 capsules every 8 hours if needed for cough  42 capsule  0   . Budesonide 0.5 MG/2ML Inhalation Suspension Inhale 2 mL (0.5 mg) via nebulizer every 12 hours.  120 mL  1   . Certolizumab Pegol (CIMZIA PREFILLED) 2 X 200 MG/ML Subcutaneous Kit Inject 200 mg under the skin every 2 weeks.       . Clarithromycin 500 MG Oral Tab Take 1 tablet (500 mg) by mouth every 12 hours. For infection. Take until gone.  20 tablet  0   . Clotrimazole-Betamethasone 1-0.05 % External Cream Apply 1 application topically 2 times a day  as needed (rash). Apply to buttocks  45 g  2   . Cyclobenzaprine HCl (FLEXERIL) 10 MG Oral Tab Take 1 tablet by mouth 3 times per day as needed for muscle pain  60 Tab  1   . DULoxetine HCl 60 MG Oral CAPSULE ENTERIC COATED PARTICLES     0   . Esomeprazole Magnesium 40 MG Oral CAPSULE DELAYED RELEASE One tablet twice per day on empty stomach  180 capsule  1   . Fexofenadine-Pseudoephed ER 60-120 MG Oral TABLET SR 12 HR Take 1 tablet by mouth every 12 hours as needed for allergies.  30 tablet  1   . Fluticasone-Salmeterol 500-50 MCG/DOSE Inhalation AEROSOL POWDER, BREATH ACTIVATED Inhale 1 puff by mouth 2 times a day.  1 Device  1   . Formoterol Fumarate 20 MCG/2ML Inhalation Nebu Soln Inhale 2 mL via nebulizer 2 times a day.  60 mL  1   . Hydrocodone-Acetaminophen 10-325 MG Oral Tab take 1 to 2 tablets by mouth every 6 hours if needed for pain , maximum daily dose of 8 tablets  30 tablet  0   . Ipratropium-Albuterol 20-100 MCG/ACT Inhalation Aero Soln Inhale 2 puffs by mouth every 4 hours as needed (cough).  1 Inhaler  11   . Levothyroxine Sodium 75 MCG Oral Tab Take 1 tablet (75 mcg) by mouth daily.  90 tablet  3   . Lorazepam 2 MG Oral Tab 1 TABLET TWICE DAILY       . Losartan Potassium 50 MG Oral Tab 1 po qd  30 tablet  5   . LYRICA 75 MG Oral Cap        . Meloxicam 15 MG Oral Tab     0   . Montelukast Sodium 10 MG Oral Tab Take 1 tablet (10 mg) by mouth every evening.  90 tablet  1   . Olopatadine HCl 0.6 % Nasal Solution Spray 2 sprays into each nostril 2 times a day.  1 bottle  11   . PredniSONE 20 MG Oral Tab Take 2 tablets (40 mg) by mouth daily.  28 tablet  0   . Promethazine-Codeine 6.25-10 MG/5ML Oral Syrup Take 5 mL by mouth every 6 hours as needed for allergies (cough). Take 1 or 2 teaspoons every 6 hrs as needed  240 mL  1   . Respiratory Therapy Supplies (NEBULIZER) Does not apply Device Use every 6 hours as needed for other (chronic bronchitis). Chronic bronchitis  1 Device  0   . Spacer/Aero-Holding  Chambers (AEROCHAMBER Z-STAT PLUS) Misc Use with inhaler as directed.  1 Device  1   . Tiotropium Bromide Monohydrate 18 MCG Inhalation Cap Inhale contents of 1 capsule (18 mcg) by mouth daily. Inhale contents of capsule.  30 capsule  0  No facility-administered medications prior to visit.       Allergies:  Review of patient's allergies indicates:  Allergies   Allergen Reactions   . Morphine Nausea/Vomiting   . Penicillins Rash       Social History:  History     Social History   . Marital Status: Married     Spouse Name: N/A     Number of Children: N/A   . Years of Education: N/A     Occupational History   . Not on file.     Social History Main Topics   . Smoking status: Former Research scientist (life sciences)   . Smokeless tobacco: Not on file   . Alcohol Use: No   . Drug Use: No   . Sexual Activity: Not on file     Other Topics Concern   . Not on file     Social History Narrative    Recently moved from [place].  Works for [employer].  One daughter.  Sister is an Therapist, sports. 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    Physical Exam:  BP 147/98  Pulse 115  Temp(Src) 95.7 F (35.4 C) (Temporal)  Ht _0  (1.778 m)  Wt 260 lb 5.8 oz (118.1 kg)  BMI 37.36 kg/m2  SpO2 97%  General: healthy, alert, no distress. He coughs a harsh sounding dry cough frequently intermittently during the exam, with no sputum. However while not talking he could go for several minutes without a cough.  Neuro: Alert, good medical historian. Normal gait.  Psych: Normal affect.  Skin: No hives, dermatographism, eczema, or other rashes.  Eyes: Lids/periorbital skin normal, Conjunctivae clear.  Nose: Nares patent, no edema, no erythema in the nasopharynx.  Oropharynx: Lips, mucosa, and tongue without erythema or edema.   Lungs: Good air movement throughout all lung fields. No respiratory  distress.  Heart: normal rate, regular rhythm.      Review of Results:  Labs from last visit reviewed and discussed in the HPI.    Assessment:  # Persistent cough  # Allergic rhinitis  # Exercise induced asthma    Discussion/Plan:  Mr. Ybarbo is a 48yo with a reported history of allergic rhinitis and mild intermittent asthma now with over 2 months of persist cough. His chest CT effectively rules out lung pathologies including lung infection and interstitial lung disease. His CBC and immunoglobulins are very reassuring that we are not dealing with an immune deficiency. Similarly his lack of response to steroids and bronchodilators and his normal spirometry argue against asthma exacerbation. I believe his cough is likely related to an upper respiratory process. He may have a persistent upper airway viral infection related to his underlying immunosuppression. However I think at least some of his cough at this point may be habit cough or irritation induced from a prior upper respiratory infection that he has now cleared. I recommend he continue to hold immunosupression (corticosteroid and anti-TNF) until the cough clears at which point he can resume anti-TNF, and that he continue conservative cough management for the time being with combivent and tessalon. I also recommended he continue to treat his allergic rhinitis with patanase and allegra. He can hold off on flonase given the streaked blood in the nose. He can continue budesonide and formoterol until he runs out, at which point he can  resume his controlled Advair.       I would like to see Nazaire Cordial Scinto back in clinic in approximately 12 months or earlier if needed.    It was a pleasure seeing Chipper Koudelka in [facility], please do not hesitate to contact me with any questions.    Glennie Hawk, MD

## 2013-09-02 NOTE — Progress Notes (Signed)
I interviewed the patient and agree with Dx and Rx    [name], MD

## 2013-09-09 ENCOUNTER — Ambulatory Visit (INDEPENDENT_AMBULATORY_CARE_PROVIDER_SITE_OTHER): Payer: BLUE CROSS/BLUE SHIELD | Admitting: Family

## 2013-09-09 ENCOUNTER — Encounter (INDEPENDENT_AMBULATORY_CARE_PROVIDER_SITE_OTHER): Payer: Self-pay | Admitting: Family

## 2013-09-09 VITALS — BP 147/95 | HR 99 | Temp 97.8°F | Wt 260.0 lb

## 2013-09-09 DIAGNOSIS — R059 Cough, unspecified: Secondary | ICD-10-CM

## 2013-09-09 DIAGNOSIS — J45901 Unspecified asthma with (acute) exacerbation: Secondary | ICD-10-CM

## 2013-09-09 DIAGNOSIS — J310 Chronic rhinitis: Secondary | ICD-10-CM

## 2013-09-09 DIAGNOSIS — H6993 Unspecified Eustachian tube disorder, bilateral: Secondary | ICD-10-CM

## 2013-09-09 DIAGNOSIS — H6990 Unspecified Eustachian tube disorder, unspecified ear: Secondary | ICD-10-CM

## 2013-09-09 DIAGNOSIS — J4541 Moderate persistent asthma with (acute) exacerbation: Secondary | ICD-10-CM

## 2013-09-09 DIAGNOSIS — J329 Chronic sinusitis, unspecified: Secondary | ICD-10-CM

## 2013-09-09 DIAGNOSIS — K219 Gastro-esophageal reflux disease without esophagitis: Secondary | ICD-10-CM

## 2013-09-09 MED ORDER — ESOMEPRAZOLE MAGNESIUM 40 MG OR CPDR
DELAYED_RELEASE_CAPSULE | ORAL | Status: DC
Start: 2013-09-09 — End: 2014-06-25

## 2013-09-09 MED ORDER — PROMETHAZINE-CODEINE 6.25-10 MG/5ML OR SYRP
5.0000 mL | ORAL_SOLUTION | Freq: Four times a day (QID) | ORAL | Status: DC | PRN
Start: 2013-09-09 — End: 2013-10-10

## 2013-09-09 MED ORDER — CLARITHROMYCIN 500 MG OR TABS
500.0000 mg | ORAL_TABLET | Freq: Two times a day (BID) | ORAL | Status: DC
Start: 2013-09-09 — End: 2013-10-10

## 2013-09-09 NOTE — Patient Instructions (Signed)
It was a pleasure to see you in clinic today.                  If you are not yet signed up for eCare, please speak with a team member at the front desk who would happy to assist you with this process.      eCare  enrollment will allow you access to the below benefits   You can make appointments online   View test results / Lab Results   Obtain a copy of our After Visit Summary (a summary of your visit today)     Your Test Results:  If labs were ordered today the results are expected to be available via eCare in about 5 days. If you have an active eCare account, this is how we will notify you of your results.     If you do not have an eCare account then your test results will be mailed to you within about 14 days after your tests are completed. If your physician needs to change your care based on your results or is concerned, you will receive a phone call.     If you have any questions about your test results please schedule an appointment with your provider.    **If it has been more than 2 weeks and you have not received your test results please send our office a message via eCare.    Medication Refills: If you need a prescription refilled, please contact your pharmacy 1 week before your current supply will run out to request the refill.  Contacting your pharmacy is the fastest and safest way to obtain a medication refill.  The pharmacy will notify our office.  Please note, that a minimum of 48 to 72 hours is needed to refill a medication, but refills are usually processed and sent to your pharmacy in about 5 business days.  Please call your pharmacy early to allow enough time to refill before you anticipate running out.  For faster medication refills, you can also schedule an appointment with your provider.    We know you have a choice in where you receive your healthcare and we sincerely thank you for trusting [facility] with your health.

## 2013-09-12 NOTE — Progress Notes (Signed)
SUBJECTIVE:  Patient presents with URI and reoccuring; Pain in face with yellow drainage. Allergy  symptoms for the past several days including nasal congestion, rhinorrhea, and non-productive cough. He was recently treated for severe bronchitis with pneumonia and has recently finally resolved. Under care of allergies for resistant to treatment rhinitis and asthma.    No Low grade/ fever. No vomiting or diarrhea. Taking food and fluids well.    OBJECTIVE:  Alert, well hydrated. TM's normal. Yellow rhinorrhea with swollen nasal mucousa. Oropharynx shows mild erythema. Neck supple without significant adenopathy. Lungs no wheezing with rhonchi of right middle lobe. Abdomen soft.    ASSESSMENT:      (530.81) GERD (gastroesophageal reflux disease)    Plan: Esomeprazole Magnesium 40 MG Oral CAPSULE         DELAYED RELEASE   refill    (472.0) Rhinitis  Plan: Continue allergy medication from specialist. Phone him with current symptoms    (493.92) Asthma, moderate persistent, with acute exacerbation  Plan: Promethazine-Codeine 6.25-10 MG/5ML Oral Syrup  For his cough and continue current plan of care with allergies    (786.2) Cough and reoccuring Sinusitis  Plan: Clarithromycin 500 MG Oral Tab  Provided antibiotic and will start if worse over the weekend

## 2013-09-20 ENCOUNTER — Telehealth (INDEPENDENT_AMBULATORY_CARE_PROVIDER_SITE_OTHER): Payer: Self-pay | Admitting: Family

## 2013-09-20 NOTE — Telephone Encounter (Signed)
No paper work seen.  Will keep looking.

## 2013-09-20 NOTE — Telephone Encounter (Signed)
Received the completed [employer] FMLA form from [employer]. A copy of this form will be kept for 90 days.  Form fax[facility] back to [facility] at  fax # [phone] phone # [phone]. (Only if the form is for L & I, FMLA, or MVA a copy should be placed in Urgent Scan Pile)

## 2013-09-20 NOTE — Telephone Encounter (Signed)
Received a Medical Certification form via Patient dropped off. Form placed in Dr.Kerry Meyer's in box to complete    Main contact for form if there are questions/concerns: Anthony KeysMartin Andrade  Phone #: [phone]  Please route back to the Team Pool when complete.    Note on paperwork from patient states that he would like to come back in and pick up copies

## 2013-09-20 NOTE — Telephone Encounter (Signed)
Routing to Team Pool to try and locate form.

## 2013-09-23 ENCOUNTER — Encounter (HOSPITAL_BASED_OUTPATIENT_CLINIC_OR_DEPARTMENT_OTHER): Payer: BLUE CROSS/BLUE SHIELD | Admitting: Allergy & Immunology

## 2013-10-06 ENCOUNTER — Encounter (INDEPENDENT_AMBULATORY_CARE_PROVIDER_SITE_OTHER): Payer: Self-pay | Admitting: Family

## 2013-10-06 NOTE — Telephone Encounter (Signed)
Called and LM for Pt to call back in to review the Sx he mentioned in eCare message.   Will forward to try Pt again later today.   Will also send eCare message asking Pt to call in.     .CCR:  If patient returns phone call, please transfer call to an [place] medical assistant at extension [phone]. If no one is available to answer, please take a message and forward the TE to the [facility] Clinical Staff Pool.

## 2013-10-06 NOTE — Telephone Encounter (Signed)
Routing to pool to be triaged on symptoms.

## 2013-10-07 NOTE — Telephone Encounter (Signed)
Initial Telephone Call Documentation     Patient Complaint (use patient's exact words):  Dizziness 3 days long with ear and eye discomfort and ongoing cough   When did it start?  Several days since     Does anything make the symptom worse (if Yes - list activity/action & result)?  Yes, Standing up   Rate your pain or discomfort on a 0 -10 scale:  Not Asked   Quality of pain:  Not Asked   Have you tried anything to relieve the symptom(s) (if Yes - list action & result)?   Yes, Sleeping has helped     Protocol used and page#:  Dizziness pg 175lb  Negative per protocol: Section A: No symptoms present, Section B:  No symptoms present and Section C: No symptoms present  Positive per protocol:  Section D:  Persistent Lightheadedness>3days   Protocol recommended disposition:  See provider within 24 hours    Caller agrees:  YES  Plan:  Patient appointed with previously and states is ok to see provider Fonnie MuKerry Meyer ARNP on 10/08/13 at 8:40AM      Instructed to call back or seek treatment if symptoms worsen or has new concerns:  YES  Caller understands plan:  YES  Reference used:  [employer] 4th edition, Telephone Triage Protocols for Nurses

## 2013-10-08 ENCOUNTER — Encounter (INDEPENDENT_AMBULATORY_CARE_PROVIDER_SITE_OTHER): Payer: BLUE CROSS/BLUE SHIELD | Admitting: Family

## 2013-10-08 ENCOUNTER — Ambulatory Visit (INDEPENDENT_AMBULATORY_CARE_PROVIDER_SITE_OTHER): Payer: BLUE CROSS/BLUE SHIELD | Admitting: Family

## 2013-10-08 VITALS — BP 150/80 | HR 98 | Temp 97.9°F | Resp 16 | Wt 259.0 lb

## 2013-10-08 DIAGNOSIS — Z5189 Encounter for other specified aftercare: Secondary | ICD-10-CM

## 2013-10-08 DIAGNOSIS — B3781 Candidal esophagitis: Secondary | ICD-10-CM

## 2013-10-08 DIAGNOSIS — T7840XD Allergy, unspecified, subsequent encounter: Secondary | ICD-10-CM

## 2013-10-08 DIAGNOSIS — R059 Cough, unspecified: Secondary | ICD-10-CM

## 2013-10-08 DIAGNOSIS — J310 Chronic rhinitis: Secondary | ICD-10-CM

## 2013-10-08 DIAGNOSIS — H109 Unspecified conjunctivitis: Secondary | ICD-10-CM

## 2013-10-08 MED ORDER — MONTELUKAST SODIUM 10 MG OR TABS
10.0000 mg | ORAL_TABLET | Freq: Every evening | ORAL | Status: DC
Start: 2013-10-08 — End: 2014-04-19

## 2013-10-08 MED ORDER — FLUCONAZOLE 150 MG OR TABS
ORAL_TABLET | ORAL | Status: DC
Start: 2013-10-08 — End: 2013-11-12

## 2013-10-08 NOTE — Progress Notes (Signed)
Patient, [name], here for multiple concerns.    1. Cough and Allergies continues to be problematic. Last saw Specialist, Dr. Cristie Hem on 08/31/2013. [name] had been doing better on the Combivent and Flonase.  But Dr Cristie Hem made some changes and the cough seemed to return pretty quickly.  Patient is on a plethora of medications to help control the cough and allergies.  He also reports he has had High Resolution Lung CT that was negative. Spirometry performed on February was also negative for Asthma exacerbation.    He describes cough as starting with tickle then goes into some kind of spasms and sometimes he actually vomits.  Comes and goes all day long. Feels like rain makes it better. He is also taking medication for allergies.    2. Memory problems - Thinks that he has developed memory problems with all the medications he is taking. Can't seem to remember simple things that he typically has not problem remembering. He is not sleeping that well. He has not been back to work for more than 2 weeks since the first of the year. This also weighs heavy on his mind.     3. He has sticky eye discharge in the morning. Some itchiness. Vision clears when discharge is cleared. He denies pain or yellow drainage.   This is started in last week.      Patient Active Problem List   Diagnosis   . DEPRESSIVE DISORDER NEC   . ALLERGIC RHINITIS NOS   . ANKYLOSING SPONDYLITIS   . Chronic pain   . Hypertension   . Hypothyroidism   . Foot pain   . Other acquired absence of organ   . Other postprocedural status(V45.89)   . Unspecified sleep apnea   . Asthma   . Cough   . Allergic rhinitis due to allergen   . Restless legs syndrome   . Anterior corneal dystrophy   . Sleep apnea     Outpatient Prescriptions Prior to Visit   Medication Sig Dispense Refill   . Albuterol Sulfate (2.5 MG/3ML) 0.083% Inhalation Nebu Soln Inhale 3 mL (2.5 mg) via nebulizer every 6 hours as needed for shortness of breath/wheezing.  100 vial  1   .  Albuterol Sulfate HFA (PROAIR HFA) 108 (90 BASE) MCG/ACT Inhalation Aero Soln Inhale 2 puffs by mouth every 4 hours as needed (asthma). For Asthma. May use every 2 hours for attacks, or pereventively before exercise.  1 Inhaler  6   . Benzonatate (TESSALON) 200 MG Oral Cap Take 1 capsule (200 mg) by mouth 3 times a day as needed for cough.  240 capsule  2   . Benzonatate 100 MG Oral Cap take 1 or 2 capsules every 8 hours if needed for cough  42 capsule  0   . Budesonide 0.5 MG/2ML Inhalation Suspension Inhale 2 mL (0.5 mg) via nebulizer every 12 hours.  120 mL  1   . Certolizumab Pegol (CIMZIA PREFILLED) 2 X 200 MG/ML Subcutaneous Kit Inject 200 mg under the skin every 2 weeks.       . Clarithromycin 500 MG Oral Tab Take 1 tablet (500 mg) by mouth every 12 hours. For infection. Take until gone.  20 tablet  0   . Clotrimazole-Betamethasone 1-0.05 % External Cream Apply 1 application topically 2 times a day as needed (rash). Apply to buttocks  45 g  2   . Cyclobenzaprine HCl (FLEXERIL) 10 MG Oral Tab Take 1 tablet by mouth 3 times  per day as needed for muscle pain  60 Tab  1   . DULoxetine HCl 60 MG Oral CAPSULE ENTERIC COATED PARTICLES     0   . Esomeprazole Magnesium 40 MG Oral CAPSULE DELAYED RELEASE One tablet twice per day on empty stomach  180 capsule  1   . Fexofenadine-Pseudoephed ER 60-120 MG Oral TABLET SR 12 HR Take 1 tablet by mouth every 12 hours as needed for allergies.  30 tablet  1   . Fluticasone-Salmeterol 500-50 MCG/DOSE Inhalation AEROSOL POWDER, BREATH ACTIVATED Inhale 1 puff by mouth 2 times a day.  1 Device  1   . Formoterol Fumarate 20 MCG/2ML Inhalation Nebu Soln Inhale 2 mL via nebulizer 2 times a day.  60 mL  1   . Hydrocodone-Acetaminophen 10-325 MG Oral Tab take 1 to 2 tablets by mouth every 6 hours if needed for pain , maximum daily dose of 8 tablets  30 tablet  0   . Ipratropium-Albuterol 20-100 MCG/ACT Inhalation Aero Soln Inhale 2 puffs by mouth every 4 hours as needed (cough).  1  Inhaler  11   . Levothyroxine Sodium 75 MCG Oral Tab Take 1 tablet (75 mcg) by mouth daily.  90 tablet  3   . Lorazepam 2 MG Oral Tab 1 TABLET TWICE DAILY       . Losartan Potassium 50 MG Oral Tab 1 po qd  30 tablet  5   . LYRICA 75 MG Oral Cap        . Meloxicam 15 MG Oral Tab     0   . Montelukast Sodium 10 MG Oral Tab Take 1 tablet (10 mg) by mouth every evening.  90 tablet  1   . Olopatadine HCl 0.6 % Nasal Solution Spray 2 sprays into each nostril 2 times a day.  1 bottle  11   . PredniSONE 20 MG Oral Tab Take 2 tablets (40 mg) by mouth daily.  28 tablet  0   . Promethazine-Codeine 6.25-10 MG/5ML Oral Syrup Take 5 mL by mouth every 6 hours as needed for allergies (cough). Take 1 or 2 teaspoons every 6 hrs as needed  240 mL  1   . Respiratory Therapy Supplies (NEBULIZER) Does not apply Device Use every 6 hours as needed for other (chronic bronchitis). Chronic bronchitis  1 Device  0   . Spacer/Aero-Holding Chambers (AEROCHAMBER Z-STAT PLUS) Misc Use with inhaler as directed.  1 Device  1   . Tiotropium Bromide Monohydrate 18 MCG Inhalation Cap Inhale contents of 1 capsule (18 mcg) by mouth daily. Inhale contents of capsule.  30 capsule  0     No facility-administered medications prior to visit.     Physical Exam   Constitutional: He appears distressed.   HENT:   Head: Head is with raccoon's eyes.   Right Ear: External ear and ear canal normal.   Left Ear: External ear and ear canal normal.   Nose: Mucosal edema and rhinorrhea present. Right sinus exhibits no maxillary sinus tenderness and no frontal sinus tenderness. Left sinus exhibits no maxillary sinus tenderness and no frontal sinus tenderness.   Mouth/Throat: Uvula is midline. Oropharyngeal exudate present. No posterior oropharyngeal edema or tonsillar abscesses.       Eyes: Pupils are equal, round, and reactive to light. Right eye exhibits discharge and exudate. Left eye exhibits discharge and exudate. Right conjunctiva is injected. Left conjunctiva is  injected.   Pulmonary/Chest: Breath sounds normal. He has no decreased breath  sounds. He has no wheezes. He has no rhonchi. He has no rales.   Abdominal: Bowel sounds are normal. There is no tenderness.   Coughing sporatically and deep, non productive.       We spent 30 minutes reviewing his chart and medication plan.  Total time of 45 minutes was spent face-to-face with the patient, of which more than 50% was spent counseling and coordinating care  as outlined in this note.      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      The following suggestions are made:    1) Follow up with Dr Cristie Hem for possibly Laryngeascopic exam.  2) Stop the Tessalon Pearls.  Start Codeine every 6 hours for cough suppression. Note Tessalon pearls can cause bronchospasms and/or laryngospasms.  3) Stop any other narcotics such as hydrocodone.   4) Take Maximum strength Robitussin as directed on bottle.  5) Hold Melaxicam  6) Take Flonase 2 sprays twice daily  (he stopped this 08/31/2013).  7) Start probiotic - Primadiphilus Bifadus (found at [employer] [employer] in organic section or [facility]).  8) Take Combivent instead of Advair.  9) Take Singulair (Montelukast) at night and Allegra during the day.  10) Air filter for bedroom if not already using one.    Continue other medications.   Food Allergy testing  Follow up with me in 3 to 5 days.

## 2013-10-08 NOTE — Patient Instructions (Addendum)
It was a pleasure to see you in clinic today.               1) Medication for antihistamine: Limit Montelukast 10 mg at night and perhaps one other antihistamine such as zyrtec OR allergra (in the morning)   2) Dilfucan for the yeast  3) Stay on other meds for next 1-2 days  4) Paper work  5) Please come in for the blood test tomorrow         If you are not yet signed up for eCare, please speak with a team member at the front desk who would happy to assist you with this process.      eCare  enrollment will allow you access to the below benefits   You can make appointments online   View test results / Lab Results   Obtain a copy of our After Visit Summary (a summary of your visit today)     Your Test Results:  If labs were ordered today the results are expected to be available via eCare in about 5 days. If you have an active eCare account, this is how we will notify you of your results.     If you do not have an eCare account then your test results will be mailed to you within about 14 days after your tests are completed. If your physician needs to change your care based on your results or is concerned, you will receive a phone call.     If you have any questions about your test results please schedule an appointment with your provider.    **If it has been more than 2 weeks and you have not received your test results please send our office a message via eCare.    Medication Refills: If you need a prescription refilled, please contact your pharmacy 1 week before your current supply will run out to request the refill.  Contacting your pharmacy is the fastest and safest way to obtain a medication refill.  The pharmacy will notify our office.  Please note, that a minimum of 48 to 72 hours is needed to refill a medication, but refills are usually processed and sent to your pharmacy in about 5 business days.  Please call your pharmacy early to allow enough time to refill before you anticipate running out.  For faster  medication refills, you can also schedule an appointment with your provider.    We know you have a choice in where you receive your healthcare and we sincerely thank you for trusting [facility] with your health.

## 2013-10-09 ENCOUNTER — Other Ambulatory Visit (INDEPENDENT_AMBULATORY_CARE_PROVIDER_SITE_OTHER): Payer: BLUE CROSS/BLUE SHIELD

## 2013-10-09 ENCOUNTER — Other Ambulatory Visit (INDEPENDENT_AMBULATORY_CARE_PROVIDER_SITE_OTHER): Payer: Self-pay | Admitting: Family

## 2013-10-09 DIAGNOSIS — J310 Chronic rhinitis: Secondary | ICD-10-CM

## 2013-10-10 MED ORDER — POLYMYXIN B-TRIMETHOPRIM 10000-0.1 UNIT/ML-% OP SOLN
1.0000 [drp] | OPHTHALMIC | Status: AC
Start: 2013-10-10 — End: 2013-10-15

## 2013-10-10 MED ORDER — OLOPATADINE HCL 0.2 % OP SOLN
1.0000 [drp] | Freq: Two times a day (BID) | OPHTHALMIC | Status: DC
Start: 2013-10-10 — End: 2013-10-11

## 2013-10-10 MED ORDER — CODEINE SULFATE 30 MG OR TABS
30.0000 mg | ORAL_TABLET | Freq: Four times a day (QID) | ORAL | Status: DC | PRN
Start: 2013-10-10 — End: 2013-10-13

## 2013-10-11 ENCOUNTER — Telehealth (INDEPENDENT_AMBULATORY_CARE_PROVIDER_SITE_OTHER): Payer: Self-pay | Admitting: Family

## 2013-10-11 DIAGNOSIS — H109 Unspecified conjunctivitis: Secondary | ICD-10-CM

## 2013-10-11 MED ORDER — OLOPATADINE HCL 0.2 % OP SOLN
1.0000 [drp] | Freq: Every day | OPHTHALMIC | Status: DC
Start: 2013-10-11 — End: 2017-08-25

## 2013-10-11 NOTE — Telephone Encounter (Signed)
CONFIRMED PHONE NUMBER: [phone]  CALLERS FIRST AND LAST NAME: Wilmer FloorGizelle  FACILITY NAME: [facility] TITLE: Pharmacist  CALLERS RELATIONSHIP:OTHER: see above  RETURN CALL: OK to leave detailed message with anyone that answers - please ask for a pharmacist     SUBJECT: General Message   REASON FOR REQUEST: Medication Question    MESSAGE: Pharmacy would like to verify the medication instructions for Olopatadine Opthalmic Solution.     Please contact the pharmacy.    Thank you

## 2013-10-11 NOTE — Telephone Encounter (Signed)
Spoke to the pharmacist she states Pataday was ordered to use twice a day but it is normally used as once a day. Do you really want Pataday for twice a day? Please advise, pt is waiting.     Per pharmacist:   Pataday - usually once a day   Patanol - usually twice daiily  "Like sister medications"      Routing to [employer] Meyer to advise

## 2013-10-11 NOTE — Telephone Encounter (Signed)
I resent prescription to pharmacy with dose adjustment.

## 2013-10-12 ENCOUNTER — Encounter (INDEPENDENT_AMBULATORY_CARE_PROVIDER_SITE_OTHER): Payer: Self-pay | Admitting: Family

## 2013-10-12 ENCOUNTER — Telehealth (INDEPENDENT_AMBULATORY_CARE_PROVIDER_SITE_OTHER): Payer: Self-pay | Admitting: Family

## 2013-10-12 NOTE — Telephone Encounter (Signed)
Rx for Codeine Sulfate faxed to [facility] at fax #: [phone]

## 2013-10-12 NOTE — Telephone Encounter (Signed)
Anthony Andrade, patient would like a Referral for a Larynx/bronchial area scope, and would like a note from you for the following work days off:  4/6,4/7,4/10,4/13 thru 4/17,and 4/20 thru 4/22.    Please advise.    Routing to [employer]erry Andrade

## 2013-10-13 ENCOUNTER — Encounter (INDEPENDENT_AMBULATORY_CARE_PROVIDER_SITE_OTHER): Payer: Self-pay | Admitting: Family

## 2013-10-13 ENCOUNTER — Telehealth (INDEPENDENT_AMBULATORY_CARE_PROVIDER_SITE_OTHER): Payer: Self-pay | Admitting: Family

## 2013-10-13 DIAGNOSIS — R059 Cough, unspecified: Secondary | ICD-10-CM

## 2013-10-13 LAB — ALLERGEN (GROUP OF 26)

## 2013-10-13 LAB — ALLERGEN(S) REQUESTED

## 2013-10-13 MED ORDER — ACETAMINOPHEN-CODEINE 300-15 MG OR TABS
ORAL_TABLET | ORAL | Status: DC
Start: 2013-10-13 — End: 2013-11-12

## 2013-10-13 NOTE — Telephone Encounter (Signed)
CONFIRMED PHONE NUMBER: [phone]  CALLERS FIRST AND LAST NAME: Isidoro DonningAnton  FACILITY NAME: [facility] TITLE: pharmacist  CALLERS RELATIONSHIP:OTHER:   RETURN CALL: Detailed message on voicemail only     SUBJECT: General Message   REASON FOR REQUEST: medication change    MESSAGE: caller says manufacturer does not have the plain codeine in stock and he would like to use codeine with Tylenol. Please advise.

## 2013-10-13 NOTE — Telephone Encounter (Signed)
Patient picked up Codeine Sulfate  on 10/13/13    Original script was faxed,  Sent back, patient picked up formal paper script

## 2013-10-13 NOTE — Telephone Encounter (Signed)
Called in Rx  Closing TE

## 2013-10-13 NOTE — Telephone Encounter (Signed)
Order chanced to tylenol with codeine. This can be phoned to the pharmacy.   Thank you

## 2013-10-13 NOTE — Telephone Encounter (Signed)
Routing to [employer]nel[employer]erry Meyer, ARNP to advise if it's ok to use Tylenol with Codeine rather that plain codeine? Pharmacy cannot get plain codeine.

## 2013-10-14 ENCOUNTER — Other Ambulatory Visit (INDEPENDENT_AMBULATORY_CARE_PROVIDER_SITE_OTHER): Payer: Self-pay | Admitting: Family

## 2013-10-14 ENCOUNTER — Encounter (HOSPITAL_BASED_OUTPATIENT_CLINIC_OR_DEPARTMENT_OTHER): Payer: BLUE CROSS/BLUE SHIELD | Admitting: Allergy & Immunology

## 2013-10-14 ENCOUNTER — Encounter (INDEPENDENT_AMBULATORY_CARE_PROVIDER_SITE_OTHER): Payer: Self-pay | Admitting: Family

## 2013-10-14 DIAGNOSIS — I10 Essential (primary) hypertension: Secondary | ICD-10-CM

## 2013-10-14 MED ORDER — LOSARTAN POTASSIUM 50 MG OR TABS
50.0000 mg | ORAL_TABLET | Freq: Every day | ORAL | Status: DC
Start: 2013-10-14 — End: 2014-01-14

## 2013-10-14 NOTE — Telephone Encounter (Signed)
Patient reports he is scheduled for a follow up OV on Friday.  Patient reports coughing is improving. Closing TE.

## 2013-10-15 ENCOUNTER — Encounter (INDEPENDENT_AMBULATORY_CARE_PROVIDER_SITE_OTHER): Payer: BLUE CROSS/BLUE SHIELD | Admitting: Family

## 2013-10-17 NOTE — Progress Notes (Signed)
This patient failed a scheduled appointment today.  Disposition: R/S

## 2013-10-28 ENCOUNTER — Encounter (HOSPITAL_BASED_OUTPATIENT_CLINIC_OR_DEPARTMENT_OTHER): Payer: BLUE CROSS/BLUE SHIELD | Admitting: Allergy & Immunology

## 2013-11-09 ENCOUNTER — Telehealth (INDEPENDENT_AMBULATORY_CARE_PROVIDER_SITE_OTHER): Payer: Self-pay | Admitting: Family

## 2013-11-09 ENCOUNTER — Encounter (INDEPENDENT_AMBULATORY_CARE_PROVIDER_SITE_OTHER): Payer: Self-pay | Admitting: Family

## 2013-11-09 NOTE — Telephone Encounter (Signed)
Patient dropped off note regarding FMLA absences and needing a work note due to those absences.  Note placed in [employer]ette[employer] Costa[employer]erry Meyers in box in work room.     No form to complete, patient just needs letter written

## 2013-11-09 NOTE — Telephone Encounter (Signed)
Letter completed per patient's request.

## 2013-11-11 NOTE — Telephone Encounter (Signed)
Per below letter complete, routing to team pool again for patient to pick up.

## 2013-11-12 ENCOUNTER — Encounter (INDEPENDENT_AMBULATORY_CARE_PROVIDER_SITE_OTHER): Payer: Self-pay | Admitting: Family

## 2013-11-12 ENCOUNTER — Ambulatory Visit (INDEPENDENT_AMBULATORY_CARE_PROVIDER_SITE_OTHER): Payer: BLUE CROSS/BLUE SHIELD | Admitting: Family

## 2013-11-12 ENCOUNTER — Other Ambulatory Visit (INDEPENDENT_AMBULATORY_CARE_PROVIDER_SITE_OTHER): Payer: Self-pay | Admitting: Family

## 2013-11-12 DIAGNOSIS — M459 Ankylosing spondylitis of unspecified sites in spine: Secondary | ICD-10-CM

## 2013-11-12 DIAGNOSIS — R053 Chronic cough: Secondary | ICD-10-CM

## 2013-11-12 DIAGNOSIS — D72829 Elevated white blood cell count, unspecified: Secondary | ICD-10-CM

## 2013-11-12 DIAGNOSIS — R5381 Other malaise: Secondary | ICD-10-CM

## 2013-11-12 DIAGNOSIS — E669 Obesity, unspecified: Secondary | ICD-10-CM

## 2013-11-12 DIAGNOSIS — R059 Cough, unspecified: Secondary | ICD-10-CM

## 2013-11-12 DIAGNOSIS — R5383 Other fatigue: Secondary | ICD-10-CM

## 2013-11-12 LAB — CBC, DIFF
% Basophils: 1 %
% Eosinophils: 4 %
% Immature Granulocytes: 1 %
% Lymphocytes: 41 %
% Monocytes: 12 %
% Neutrophils: 41 %
Absolute Eosinophil Count: 0.25 10*3/uL (ref 0.00–0.50)
Absolute Lymphocyte Count: 2.96 10*3/uL (ref 1.00–4.80)
Basophils: 0.04 10*3/uL (ref 0.00–0.20)
Hematocrit: 41 % (ref 38–50)
Hemoglobin: 14.7 g/dL (ref 13.0–18.0)
Immature Granulocytes: 0.04 10*3/uL (ref 0.00–0.05)
MCH: 31.4 pg (ref 27.3–33.6)
MCHC: 35.5 g/dL (ref 32.2–36.5)
MCV: 89 fL (ref 81–98)
Monocytes: 0.82 10*3/uL — ABNORMAL HIGH (ref 0.00–0.80)
Neutrophils: 2.8 10*3/uL (ref 1.80–7.00)
Platelet Count: 168 10*3/uL (ref 150–400)
RBC: 4.68 mil/uL (ref 4.40–5.60)
RDW-CV: 12.1 % (ref 11.6–14.4)
WBC: 6.91 10*3/uL (ref 4.3–10.0)

## 2013-11-12 LAB — LIPID PANEL
Cholesterol (LDL): 44 mg/dL (ref <130)
Cholesterol/HDL Ratio: 5.3
HDL Cholesterol: 26 mg/dL — ABNORMAL LOW (ref >40)
Non-HDL Cholesterol: 113 mg/dL (ref 0–159)
Total Cholesterol: 139 mg/dL (ref <200)
Triglyceride: 343 mg/dL — ABNORMAL HIGH (ref <150)

## 2013-11-12 LAB — 1ST EXTRA GOLD TOP

## 2013-11-12 LAB — THYROID STIMULATING HORMONE: Thyroid Stimulating Hormone: 3.677 u[IU]/mL (ref 0.400–5.000)

## 2013-11-12 LAB — PR GLUCOSE, WHOLE BLOOD, ONSITE: Glucose: 124 mg/dl (ref 62–125)

## 2013-11-12 LAB — T4, FREE: Thyroxine (Free): 0.9 ng/dL (ref 0.6–1.2)

## 2013-11-12 MED ORDER — ACETAMINOPHEN-CODEINE 300-15 MG OR TABS
ORAL_TABLET | ORAL | Status: DC
Start: 2013-11-12 — End: 2015-06-14

## 2013-11-12 NOTE — Progress Notes (Signed)
Reason for visit: f/u labs & paperwork      Colon Screen: None   Have you seen a specialist since your last visit: NO      Name and location and date. n/a     HM Due:   Health Maintenance   Topic Date Due   . TETANUS BOOSTER  04/24/1976   . INFLUENZA >9 YRS  02/22/2014   . CHOLESTEROL- MALE  02/26/2018   . PNEUMOCOCCAL POLYSACCHARIDE VACCINE,2-65 YRS (#2) 04/24/2029       No future appointments.

## 2013-11-12 NOTE — Patient Instructions (Signed)
It was a pleasure to see you in clinic today.                  If you are not yet signed up for eCare, please speak with a team member at the front desk who would happy to assist you with this process.      eCare  enrollment will allow you access to the below benefits   You can make appointments online   View test results / Lab Results   Obtain a copy of our After Visit Summary (a summary of your visit today)     Your Test Results:  If labs were ordered today the results are expected to be available via eCare in about 5 days. If you have an active eCare account, this is how we will notify you of your results.     If you do not have an eCare account then your test results will be mailed to you within about 14 days after your tests are completed. If your physician needs to change your care based on your results or is concerned, you will receive a phone call.     If you have any questions about your test results please schedule an appointment with your provider.    **If it has been more than 2 weeks and you have not received your test results please send our office a message via eCare.    Medication Refills: If you need a prescription refilled, please contact your pharmacy 1 week before your current supply will run out to request the refill.  Contacting your pharmacy is the fastest and safest way to obtain a medication refill.  The pharmacy will notify our office.  Please note, that a minimum of 48 to 72 hours is needed to refill a medication, but refills are usually processed and sent to your pharmacy in about 5 business days.  Please call your pharmacy early to allow enough time to refill before you anticipate running out.  For faster medication refills, you can also schedule an appointment with your provider.    We know you have a choice in where you receive your healthcare and we sincerely thank you for trusting [facility] with your health.

## 2013-11-16 LAB — THYROGLOBULIN ANTIBODY (AUTOIMMUNE HYPOTHYROIDISM): Anti thyroglobulin: <1 IU/mL (ref 0.0–3.9)

## 2013-11-16 LAB — ANTI THYROID PEROXIDASE: Anti Thyroid Peroxidase: 3.3 IU/mL (ref 0.0–8.9)

## 2013-11-16 NOTE — Progress Notes (Signed)
Anthony Andrade is  here with chief complaint:      #1 Follow up for cough and next steps  Duration Several months  Quality Deep dry, relentless. Has finally started to resolve with new medications. Now only a few spasm episodes per day. No bloody mucous. Breathing is improved. Can perform daily tasks and back at work.  Timing: off and on throughout the day  Severity: 3  Context: severe cough for several months. Unable to work. Energy now improving  Modifying factors: New medication and allergy plan with specialist    #2:  Medication renewal and lab work  Duration NA  Quality Has been taking tylenol with codiene for back and chest pain. Improving.   Timing: 1-2 per day maximum  Severity: 4 with medication  Context: Has improved with reduction of cough. Now back under care with rheumatologist for his AS  Modifying factors: New medication is helping    Past Medical History   Diagnosis Date   . Ankylosing spondylitis    . Esophageal reflux    . Allergic rhinitis due to other allergen    . Unspecified sleep apnea    . Restless legs syndrome (RLS)      Past Surgical History   Procedure Laterality Date   . Cholecystectomy     . Unlisted procedure shoulder       left shoulder   . Esophagogastroduodenoscopy transoral diagnostic  2008     on prevacid   . Colonoscopy stoma dx including collj spec spx  2008     normal per patient       Patient Active Problem List   Diagnosis   . DEPRESSIVE DISORDER NEC   . ALLERGIC RHINITIS NOS   . ANKYLOSING SPONDYLITIS   . Chronic pain   . Hypertension   . Hypothyroidism   . Foot pain   . Other acquired absence of organ   . Other postprocedural status(V45.89)   . Unspecified sleep apnea   . Asthma   . Cough   . Allergic rhinitis due to allergen   . Restless legs syndrome   . Anterior corneal dystrophy   . Sleep apnea     Current Outpatient Prescriptions   Medication Sig Dispense Refill   . Acetaminophen-Codeine 300-15 MG Oral Tab One or two tablets every 6 hours as needed. 56 tablet 0    . Albuterol Sulfate (2.5 MG/3ML) 0.083% Inhalation Nebu Soln Inhale 3 mL (2.5 mg) via nebulizer every 6 hours as needed for shortness of breath/wheezing. 100 vial 1   . Albuterol Sulfate HFA (PROAIR HFA) 108 (90 BASE) MCG/ACT Inhalation Aero Soln Inhale 2 puffs by mouth every 4 hours as needed (asthma). For Asthma. May use every 2 hours for attacks, or pereventively before exercise. 1 Inhaler 6   . Budesonide 0.5 MG/2ML Inhalation Suspension Inhale 2 mL (0.5 mg) via nebulizer every 12 hours. 120 mL 1   . Certolizumab Pegol (CIMZIA PREFILLED) 2 X 200 MG/ML Subcutaneous Kit Inject 200 mg under the skin every 2 weeks.     . Clotrimazole-Betamethasone 1-0.05 % External Cream Apply 1 application topically 2 times a day as needed (rash). Apply to buttocks 45 g 2   . DULoxetine HCl 60 MG Oral CAPSULE ENTERIC COATED PARTICLES   0   . Esomeprazole Magnesium 40 MG Oral CAPSULE DELAYED RELEASE One tablet twice per day on empty stomach 180 capsule 1   . Fexofenadine-Pseudoephed ER 60-120 MG Oral TABLET SR 12 HR Take 1 tablet by  mouth every 12 hours as needed for allergies. 30 tablet 1   . Fluticasone Propionate 50 MCG/ACT Nasal Suspension Spray 2 sprays into each nostril 2 times a day. 1 bottle 3   . Formoterol Fumarate 20 MCG/2ML Inhalation Nebu Soln Inhale 2 mL via nebulizer 2 times a day. 60 mL 1   . Ipratropium-Albuterol 20-100 MCG/ACT Inhalation Aero Soln Inhale 2 puffs by mouth every 4 hours as needed (cough). 1 Inhaler 11   . Levothyroxine Sodium 75 MCG Oral Tab Take 1 tablet (75 mcg) by mouth daily. 90 tablet 3   . LORazepam 2 MG Oral Tab Take 1 tablet (2 mg) by mouth 2 times a day. 90 tablet 4   . Losartan Potassium 50 MG Oral Tab Take 1 tablet (50 mg) by mouth daily. 90 tablet 0   . Meloxicam 15 MG Oral Tab 1 tablet (15 mg) daily. 90 tablet 0   . Montelukast Sodium 10 MG Oral Tab Take 1 tablet (10 mg) by mouth every evening. 90 tablet 1   . Olopatadine HCl 0.2 % Ophthalmic Solution Place 1 drop in each EYE daily.  2.5 mL 2   . Olopatadine HCl 0.6 % Nasal Solution Spray 2 sprays into each nostril 2 times a day. 1 bottle 11   . PNEUMOVAX 23 25 MCG/0.5ML Injection Injection   0   . Pregabalin (LYRICA) 75 MG Oral Cap Three tablets at dinner and three tablets at bedtime 180 capsule 0   . Respiratory Therapy Supplies (NEBULIZER) Does not apply Device Use every 6 hours as needed for other (chronic bronchitis). Chronic bronchitis 1 Device 0   . Spacer/Aero-Holding Chambers (AEROCHAMBER Z-STAT PLUS) Misc Use with inhaler as directed. 1 Device 1     No current facility-administered medications for this visit.       Allergies-Morphine and Penicillins    ROS:     No night sweats.   Fatigue is still a major factor  Weight is stable  No chest pain now  Continues to have gastric reflux off and on  Hx elevated blood count x 2.     History sections of chart reviewed and updated today: Yes    PHYSICAL EXAM:  General: healthy, alert, no distress, smiling, oriented x 3  Skin: Skin color, texture, turgor normal. No rashes or concerning lesions  Eyes: Lids/periorbital skin normal, Conjunctivae/corneas clear  Ears: External ears normal. Canals clear. TM's normal.  Nose:clear secretions in nose  Oropharynx: Lips, mucosa, and tongue normal. Teeth and gums normal., posterior pharynx without erythema or drainage  Neck: supple. No adenopathy. Thyroid symmetric, normal size, without nodules  Lungs: clear to auscultation  Heart: normal rate, regular rhythm and no murmurs, clicks, or gallop  Abd: soft, non-tender. BS normal. No masses or organomegaly  Neuro: A&O X 3. Cranial nerves II - XII tested and intact.  UE and LE: muscle strength 5/5 and DTR's are physiologic.    ASSESSMENT/PLAN:      (786.2) Cough, persistent  Improving  Back at work    (278.00) Obesity (BMI 30-39.9)  (780.79) Fatigue  Plan: THYROID STIMULATING HORMONE, T4, FREE, ANTI         THYROID PEROXIDASE, ANTI THYROGLOBULIN,     (288.60) Elevated WBC count  Plan: CBC, DIFF      Refilled  medications   F/u 1 month

## 2013-11-25 NOTE — Telephone Encounter (Signed)
Letter added to FMLA forms in patient pickup. Message left on home phone, mobile not answering and unable to leave voicemail

## 2014-01-14 ENCOUNTER — Other Ambulatory Visit (INDEPENDENT_AMBULATORY_CARE_PROVIDER_SITE_OTHER): Payer: Self-pay | Admitting: Family

## 2014-01-14 DIAGNOSIS — I10 Essential (primary) hypertension: Secondary | ICD-10-CM

## 2014-01-14 MED ORDER — LOSARTAN POTASSIUM 50 MG OR TABS
50.0000 mg | ORAL_TABLET | Freq: Every day | ORAL | Status: DC
Start: 2014-01-14 — End: 2014-04-19

## 2014-01-14 NOTE — Telephone Encounter (Signed)
Patient last seen on 11/12/13 for elevated WBC and was to return in 1 month.  One refill authorized.  Please schedule follow up visit.

## 2014-02-17 ENCOUNTER — Telehealth (INDEPENDENT_AMBULATORY_CARE_PROVIDER_SITE_OTHER): Payer: Self-pay | Admitting: Family

## 2014-02-17 NOTE — Telephone Encounter (Signed)
Paper work was competed for [employer] and given to his wife here at the clinic.

## 2014-03-17 ENCOUNTER — Other Ambulatory Visit (INDEPENDENT_AMBULATORY_CARE_PROVIDER_SITE_OTHER): Payer: Self-pay | Admitting: Family

## 2014-03-17 DIAGNOSIS — E039 Hypothyroidism, unspecified: Secondary | ICD-10-CM

## 2014-03-18 MED ORDER — LEVOTHYROXINE SODIUM 75 MCG OR TABS
75.0000 ug | ORAL_TABLET | Freq: Every day | ORAL | Status: DC
Start: 2014-03-18 — End: 2014-06-15

## 2014-03-22 ENCOUNTER — Encounter (INDEPENDENT_AMBULATORY_CARE_PROVIDER_SITE_OTHER): Payer: BLUE CROSS/BLUE SHIELD | Admitting: Family

## 2014-03-25 ENCOUNTER — Encounter (INDEPENDENT_AMBULATORY_CARE_PROVIDER_SITE_OTHER): Payer: BLUE CROSS/BLUE SHIELD | Admitting: Family

## 2014-04-19 ENCOUNTER — Other Ambulatory Visit (INDEPENDENT_AMBULATORY_CARE_PROVIDER_SITE_OTHER): Payer: Self-pay | Admitting: Family

## 2014-04-19 DIAGNOSIS — Z9109 Other allergy status, other than to drugs and biological substances: Secondary | ICD-10-CM

## 2014-04-19 DIAGNOSIS — I10 Essential (primary) hypertension: Secondary | ICD-10-CM

## 2014-04-20 NOTE — Telephone Encounter (Signed)
Will defer refill auth decision to MD since patient last seen on 11/12/13 for elevated WBC and was to return in 1 month. Client has not followed up.

## 2014-04-23 MED ORDER — LOSARTAN POTASSIUM 50 MG OR TABS
ORAL_TABLET | ORAL | Status: DC
Start: 2014-04-23 — End: 2014-06-25

## 2014-04-23 MED ORDER — MONTELUKAST SODIUM 10 MG OR TABS
ORAL_TABLET | ORAL | Status: DC
Start: 2014-04-23 — End: 2014-06-25

## 2014-05-27 ENCOUNTER — Encounter (INDEPENDENT_AMBULATORY_CARE_PROVIDER_SITE_OTHER): Payer: BLUE CROSS/BLUE SHIELD | Admitting: Family

## 2014-05-27 ENCOUNTER — Other Ambulatory Visit: Payer: Self-pay

## 2014-06-07 ENCOUNTER — Other Ambulatory Visit (INDEPENDENT_AMBULATORY_CARE_PROVIDER_SITE_OTHER): Payer: Self-pay

## 2014-06-15 ENCOUNTER — Other Ambulatory Visit (INDEPENDENT_AMBULATORY_CARE_PROVIDER_SITE_OTHER): Payer: Self-pay | Admitting: Family

## 2014-06-15 DIAGNOSIS — E039 Hypothyroidism, unspecified: Secondary | ICD-10-CM

## 2014-06-16 MED ORDER — LEVOTHYROXINE SODIUM 75 MCG OR TABS
75.0000 ug | ORAL_TABLET | Freq: Every day | ORAL | Status: DC
Start: 2014-06-16 — End: 2014-06-27

## 2014-06-25 ENCOUNTER — Ambulatory Visit (INDEPENDENT_AMBULATORY_CARE_PROVIDER_SITE_OTHER): Payer: BLUE CROSS/BLUE SHIELD | Admitting: Family

## 2014-06-25 ENCOUNTER — Encounter (INDEPENDENT_AMBULATORY_CARE_PROVIDER_SITE_OTHER): Payer: Self-pay | Admitting: Family

## 2014-06-25 VITALS — BP 145/101 | HR 86 | Temp 97.8°F | Ht 70.0 in | Wt 261.2 lb

## 2014-06-25 DIAGNOSIS — Z1211 Encounter for screening for malignant neoplasm of colon: Secondary | ICD-10-CM

## 2014-06-25 DIAGNOSIS — Z9109 Other allergy status, other than to drugs and biological substances: Secondary | ICD-10-CM

## 2014-06-25 DIAGNOSIS — Z131 Encounter for screening for diabetes mellitus: Secondary | ICD-10-CM

## 2014-06-25 DIAGNOSIS — G2581 Restless legs syndrome: Secondary | ICD-10-CM

## 2014-06-25 DIAGNOSIS — E039 Hypothyroidism, unspecified: Secondary | ICD-10-CM

## 2014-06-25 DIAGNOSIS — K2100 Gastro-esophageal reflux disease with esophagitis, without bleeding: Secondary | ICD-10-CM

## 2014-06-25 DIAGNOSIS — Z13220 Encounter for screening for lipoid disorders: Secondary | ICD-10-CM

## 2014-06-25 DIAGNOSIS — Z0000 Encounter for general adult medical examination without abnormal findings: Secondary | ICD-10-CM

## 2014-06-25 DIAGNOSIS — I10 Essential (primary) hypertension: Secondary | ICD-10-CM

## 2014-06-25 LAB — THYROID STIMULATING HORMONE: Thyroid Stimulating Hormone: 3.425 u[IU]/mL (ref 0.400–5.000)

## 2014-06-25 LAB — FERRITIN: Ferritin: 84 ng/mL (ref 20–230)

## 2014-06-25 LAB — LIPID PANEL
Cholesterol/HDL Ratio: 6.2
HDL Cholesterol: 30 mg/dL — ABNORMAL LOW (ref >39)
Non-HDL Cholesterol: 156 mg/dL (ref 0–159)
Total Cholesterol: 186 mg/dL (ref <200)
Triglyceride: 414 mg/dL — ABNORMAL HIGH (ref <150)

## 2014-06-25 LAB — COMPREHENSIVE METABOLIC PANEL
ALT (GPT): 58 U/L — ABNORMAL HIGH (ref 10–48)
AST (GOT): 25 U/L (ref 9–38)
Albumin: 4.1 g/dL (ref 3.5–5.2)
Alkaline Phosphatase (Total): 95 U/L (ref 39–139)
Anion Gap: 3 — ABNORMAL LOW (ref 4–12)
Bilirubin (Total): 0.7 mg/dL (ref 0.2–1.3)
Calcium: 9.6 mg/dL (ref 8.9–10.2)
Carbon Dioxide, Total: 35 mEq/L — ABNORMAL HIGH (ref 22–32)
Chloride: 100 mEq/L (ref 98–108)
Creatinine: 0.96 mg/dL (ref 0.51–1.18)
GFR, Calc, African American: >60 mL/min (ref >59)
GFR, Calc, European American: >60 mL/min (ref >59)
Glucose: 130 mg/dL — ABNORMAL HIGH (ref 62–125)
Potassium: 3.9 mEq/L (ref 3.6–5.2)
Protein (Total): 6.8 g/dL (ref 6.0–8.2)
Sodium: 138 mEq/L (ref 135–145)
Urea Nitrogen: 13 mg/dL (ref 8–21)

## 2014-06-25 LAB — IRON BINDING CAPACITY (W/IRON, TRANSFERRIN & TRANSF SAT)
Iron, SRM: 137 ug/dL (ref 31–171)
Total Iron Binding Capacity: 375 ug/dL (ref 250–460)
Transferrin Saturation: 37 % (ref 15–50)
Transferrin: 268 mg/dL (ref 180–329)

## 2014-06-25 LAB — T4, FREE: Thyroxine (Free): 0.8 ng/dL (ref 0.6–1.2)

## 2014-06-25 MED ORDER — FLUTICASONE PROPIONATE 50 MCG/ACT NA SUSP
2.0000 | Freq: Two times a day (BID) | NASAL | Status: DC
Start: 2014-06-25 — End: 2015-06-14

## 2014-06-25 MED ORDER — LOSARTAN POTASSIUM 50 MG OR TABS
ORAL_TABLET | ORAL | Status: DC
Start: 2014-06-25 — End: 2015-04-04

## 2014-06-25 MED ORDER — ESOMEPRAZOLE MAGNESIUM 40 MG OR CPDR
DELAYED_RELEASE_CAPSULE | ORAL | Status: DC
Start: 2014-06-25 — End: 2015-01-13

## 2014-06-25 MED ORDER — MONTELUKAST SODIUM 10 MG OR TABS
10.0000 mg | ORAL_TABLET | Freq: Every evening | ORAL | Status: DC
Start: 2014-06-25 — End: 2015-07-12

## 2014-06-25 NOTE — Progress Notes (Signed)
SUBJECTIVE:    Anthony Andrade is a 49 year old male here for a wellness/preventive health care visit.     Additional concerns: Medication renewed  Fatigue - ATIVAN 2 mg twice daily. He is taking this for restless legs and acting out his dreams.   He only takes ativan 4  mg at night.   He also drinks quite a lot of water. Doesn't drink much coffee.   [facility] - Dr. Leeroy Cha [facility]) Neurologist.     TSH and Free T4 and Ferritin and Iron B.    Taking Singulair for Allergies as well.       Current dietary habits: improving  Current exercise habits: Hiking and moderate walking  Regular seat belt use: YES  Guns in the house: No  History sections of chart reviewed and updated today: yes    Patient Active Problem List    Diagnosis Date Noted   . Chronic pain [G89.29] 02/16/2012   . Hypertension [I10] 02/16/2012   . Cough [R05] 08/20/2013   . Asthma [J45.909] 07/11/2013   . Foot pain [M79.673] 04/21/2013   . Hypothyroidism [E03.9] 09/11/2012   . Allergic rhinitis due to allergen [J30.9] 04/18/2011     Overview:   Dog at home     . Restless legs syndrome [G25.81] 11/14/2010   . Unspecified sleep apnea [G47.30] 08/20/2010   . Sleep apnea [G47.30] 08/20/2010   . 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 Other acquired absence of organ [Z90.89] 08/25/2009   . Other postprocedural status(V45.89) [Z98.89] 08/25/2009   . ANKYLOSING SPONDYLITIS [M45.9] 06/19/2009     Antiinflammatory and muscle relaxant     . ALLERGIC RHINITIS NOS [J30.9] 01/19/2009   . DEPRESSIVE DISORDER NEC [F32.9] 12/16/2008       Current Outpatient Prescriptions   Medication Sig Dispense Refill   . Acetaminophen-Codeine 300-15 MG Oral Tab One or two tablets every 6 hours as needed. 56 tablet 0   . Albuterol Sulfate (2.5 MG/3ML) 0.083% Inhalation Nebu Soln Inhale 3 mL (2.5 mg) via nebulizer every 6 hours as needed for shortness of breath/wheezing. 100 vial 1   . Albuterol Sulfate HFA (PROAIR HFA) 108 (90 BASE) MCG/ACT Inhalation Aero Soln Inhale  2 puffs by mouth every 4 hours as needed (asthma). For Asthma. May use every 2 hours for attacks, or pereventively before exercise. 1 Inhaler 6   . Budesonide 0.5 MG/2ML Inhalation Suspension Inhale 2 mL (0.5 mg) via nebulizer every 12 hours. 120 mL 1   . Certolizumab Pegol (CIMZIA PREFILLED) 2 X 200 MG/ML Subcutaneous Kit Inject 200 mg under the skin every 2 weeks.     . Clotrimazole-Betamethasone 1-0.05 % External Cream Apply 1 application topically 2 times a day as needed (rash). Apply to buttocks 45 g 2   . DULoxetine HCl 60 MG Oral CAPSULE ENTERIC COATED PARTICLES   0   . Esomeprazole Magnesium 40 MG Oral CAPSULE DELAYED RELEASE One tablet twice per day on empty stomach 180 capsule 1   . Fexofenadine-Pseudoephed ER 60-120 MG Oral TABLET SR 12 HR Take 1 tablet by mouth every 12 hours as needed for allergies. 30 tablet 1   . Fluticasone Propionate 50 MCG/ACT Nasal Suspension Spray 2 sprays into each nostril 2 times a day. 1 bottle 3   . Formoterol Fumarate 20 MCG/2ML Inhalation Nebu Soln Inhale 2 mL via nebulizer 2 times a day. 60 mL 1   . Ipratropium-Albuterol 20-100 MCG/ACT Inhalation Aero Soln Inhale 2 puffs by mouth every 4  hours as needed (cough). 1 Inhaler 11   . Levothyroxine Sodium 75 MCG Oral Tab Take 1 tablet (75 mcg) by mouth daily on an empty stomach. Please keep appointment on 06/25/2014 90 tablet 0   . LORazepam 2 MG Oral Tab Take 1 tablet (2 mg) by mouth 2 times a day. 90 tablet 4   . Losartan Potassium 50 MG Oral Tab take 1 tablet by mouth once daily (MAKE AN APPOINTMENT) 90 tablet 0   . Meloxicam 15 MG Oral Tab 1 tablet (15 mg) daily. 90 tablet 0   . Montelukast Sodium 10 MG Oral Tab take 1 tablet by mouth every evening 90 tablet 1   . Olopatadine HCl 0.2 % Ophthalmic Solution Place 1 drop in each EYE daily. 2.5 mL 2   . Olopatadine HCl 0.6 % Nasal Solution Spray 2 sprays into each nostril 2 times a day. 1 bottle 11   . PNEUMOVAX 23 25 MCG/0.5ML Injection Injection   0   . Pregabalin (LYRICA) 75 MG  Oral Cap Three tablets at dinner and three tablets at bedtime 180 capsule 0   . Respiratory Therapy Supplies (NEBULIZER) Does not apply Device Use every 6 hours as needed for other (chronic bronchitis). Chronic bronchitis 1 Device 0   . Spacer/Aero-Holding Chambers (AEROCHAMBER Z-STAT PLUS) Misc Use with inhaler as directed. 1 Device 1     No current facility-administered medications for this visit.        REVIEW OF SYSTEMS:  CONSTITUTIONAL: Denies, fatigue, night sweats, .  Does have, weight gain which he is addressing  NEUROLOGIC: Denies, headaches and tremors  OPHTHALMIC: Denies, any vision problems, blurry vision, diplopia, eye pain  ENT: Denies and any ear nose or throat problems  RESPIRATORY: Denies, dyspnea on exertion, dyspnea at rest, cough, wheezing  CARDIOVASCULAR: Denies, chest pain or pressure at rest or during exercise, palpitations, orthopnea Does have some swelling in his feet. He works on his feet all day long. He can have redness as well. He also has some numbness and tingling.   GI: Denies, change in appetite, vomiting, abdominal pain, diarrhea, constipation, hematochezia, melena  GENITOURINARY: Denies, urethral discharge, erectile dysfunction, dysuria, any genito-urinary problems  INTEGUMENTARY: Denies, any skin problems, .  Does have, other skin changes including new rough mole on back and chest  RHEUMATOLOGIC/MSK: Denies, .  Does have, arthralgias with ongoing symptoms of his Ank.spondylitis,   ENDOCRINE: Denies, polyuria, polydipsia, heat intolerance, cold intolerance  PSYCHOSOCIAL: Denies, depressed mood, sleep disturbance and anxiety    PHYSICAL EXAM:  BP 145/101 mmHg  Pulse 86  Temp(Src) 97.8 F (36.6 C) (Temporal)  Ht _0  (1.778 m)  Wt 261 lb 3.2 oz (118.48 kg)  BMI 37.48 kg/m2  SpO2 98%   General: no acute distress  Skin: Skin color, texture, turgor normal. No rashes or concerning lesions, but he has a 2 cm keratinic lesion on left mid back and left ant.chest. Both are well  marginated, rough, brown and neutral color  Head: Normocephalic. No masses, lesions, tenderness or abnormalities  Eyes: Lids/periorbital skin normal, Conjunctivae/corneas clear, PERRL, EOM's intact  Ears: External ears normal. Canals clear. TM's normal.  Nose: normal  Oropharynx: normal  Teeth: normal dentition for age  Neck: supple. No adenopathy. Thyroid symmetric, normal size, without nodules  Lungs: clear to auscultation  Heart: normal rate, regular rhythm and no murmurs, clicks, or gallops  Abd: soft, non-tender. BS normal. No masses or organomegaly except an elongated liver  GU: deferred.  Rectal: deferred  Prostate: normal size, no nodules or tenderness, symmetric  Spine: Back symmetric, no deformity; ROM normal; No CVA tenderness.  Ext: Normal, without deformities, edema, or skin discoloration, radial and DP pulses 2+ bilaterally  Neuro:  Grossly normal to observation, gait normal  -----------------------------------------    ASSESSMENT AND PLAN:  Health Maintenance   Topic Date Due   . COLON CANCER SCREENING,FOBT/FIT  04/24/2014   . CHOLESTEROL- MALE  11/13/2018   . TETANUS BOOSTER  06/25/2024   . INFLUENZA VACCINE  Addressed   . HEPATITIS C SCREENING  Addressed   . HIV SCREENING  Addressed     Immunizations or studies due: see below    1. Encounter for general adult medical examination without abnormal findings  Discussed follow up with Rheumatologist and Neurologist to discuss medication management reducing or considering other options bedsides current meds    Discussed continued effort to reduce abdominal girth size  Will refilled thyroid medication after labs are completed    2. Essential hypertension  - Losartan Potassium 50 MG Oral Tab; One tablet daily by mouth  Dispense: 90 tablet; Refill: 3  - COMPREHENSIVE METABOLIC PANEL    3. Gastroesophageal reflux disease with esophagitis    - Esomeprazole Magnesium 40 MG Oral CAPSULE DELAYED RELEASE; One tablet twice per day on empty stomach  Dispense: 180  capsule; Refill: 1    4. Environmental allergies    - Montelukast Sodium 10 MG Oral Tab; Take 1 tablet (10 mg) by mouth every evening.  Dispense: 90 tablet; Refill: 3  - Fluticasone Propionate 50 MCG/ACT Nasal Suspension; Spray 2 sprays into each nostril 2 times a day.  Dispense: 1 bottle; Refill: 3    5. Screening for colon cancer    - OCCULT BLOOD BY IA, STL; Future    6. Restless legs    - IRON BINDING CAPACITY/TOT IRON  - COMPREHENSIVE METABOLIC PANEL    7. Acquired hypothyroidism    - THYROID STIMULATING HORMONE  - T4, FREE  - FERRITIN    8. Screening cholesterol level    - LIPID PANEL    9. Screening for diabetes mellitus    - COMPREHENSIVE METABOLIC PANEL      Preventive counseling: healthy dietary guidelines

## 2014-06-25 NOTE — Patient Instructions (Addendum)
It was a pleasure to see you in clinic today.               Increasing Metabolism & Possible Reduce Inflammation  Tips and Tricks  1. Eat small frequent Meals throughout the day.  Extending the time between meals makes your body go into 'starvation mode' which decreases your metabolism as a means to conserve energy and prevent starvation.  While some people are able to lose weight through intermittent fasting, most people generally eat less overall when they eat small, frequent meals.  In addition to having four to six small meals per day eating healthy snacks will also increase metabolism.  2. Choose lean proteins. Eating a diet rich in lean proteins will increase your metabolism because it takes more energy for your body to digest the protein.  Examples of lean protein are turkey, fish, eggs, beans and tofu.  3. Add spice to your favorite foods.  Spicy peppers, cayenne pepper, turmeric can increase metabolism by as much as 8% when eaten with the RIGHT foods.  4. Get at least 30 minutes of aerobic exercise daily.  Breaking it up into 5 or 10 minute intervals also works. Always look for ways to get a little more exercise into your daily activities.  5. Add strength training to your exercise program.  Muscle burns more calories than fat. Having more muscle is truly the only way to increase your resting metabolic rate which accounts for 60 to 70 percent of the calories your burn daily.   6. Drink plenty of water.  You can increase your metabolic rate by as much as 40% by simply drinking enough water.  Most people need 48-64 oz per day.  7. Drink coffee and / or green tea.  Recent research indicates green tea extract, even decaffeinated, can increase metabolism significantly.   8. Be aware that crash diets can ultimately slow down your metabolism.  9. Calculate how many calories you need to support your current and ideal weight using the Mifflin-St. Jeor equation. There are calculators on line available to do  this.  10. GOOD FATS matter.  Fill your diet with unsaturated vegetable oils (e.g. sunflower oil), unsalted nuts, and fish.  You will reduce your craving for simple sugars and carbs such as candy, ice cream, pasta and white flour products)!   11. HORMONES matter.  As we age, white fat cells (these are the pesky ones) expand especially around the midsection and the drop in hormones activates certain proteins in the body that promote fat storage there.  This is also true of cellulite.   12.  While there is no one solution to this problem, we do know that visceral fat does respond to aerobic exercise better than strength training, especially if interval training methods are incorporated into the program.  13. Sleep! Yes- to increase your metabolism you need to sleep WELL at least 7 hours nightly. If you are having difficulty with sleep, address this with your provider as you may need a comprehensive evaluation to determine the underlying cause.  It is very important to review your approach to sleep as well.   14. Obviously smoking, excessive alcohol or cannabis will reduce your metabolism.  15. DHEA? There is a lot of buzz.  It is used to slow or reverse aging, improve thinking skills, increasing muscle mass, strength and energy.  25-50 mg daily is recommended given as a single dose for most adults.  Talk to your doctor before starting this or   review side effects and interactions at the [facility] online [employer] site.  16. Chronic inflammation can reduce your motivation to exercise. Pain is pain.  There are no magic diets but following the Mediterranean diet has been shown to be helpful.  The key components are to eat generous amounts of fruits and vegetables, consume healthy fats, eat small portions of unsalted nuts, drink small portions of red wine, eat fish on a regular basis and consume very little red meat.  a. There are only a few dietary supplements with research indicating that may be of interest.   TALK TO YOUR DOCTOR BEFORE STARTING ANY OF THESE SUPPLEMENTS: Dosages have not yet been established.  Research is underway.  Consider the following:  i. Cat's Claw-may ease joint pain  ii. Devil's Claw- Used extensively in [place]  iii. Mangosteen-Anti-allergy, antifungal as well as anti-inflammatory  iv. Milk Thistle- protects the liver.   v. Alpha lipoic acid -Used for reducing burning pain in feet, memory loss, numbness, chronic fatigue, memory loss and some eye diseases.   Doses range from 300 to 1200 mg per day.  vi. Tumeric - used for arthritis,heartburn, diarrhea, stomach bloating, loss of appetite, liver and gallbladder disorders. May also help memory , inflammation, fibromyalgia and other inflammatory conditions including headaches. For arthritic pain that may interfere with exercise, 500 mg of turmeric twice daily (Meriva or Indena) twice daily.    vii. Glucosamine If you are going to take it for arthritic pain, use glucosamine sulfate. Dosage studied is limited to 500 mg three times per day.    17. Look at the information on restless legs and dreams.    Ask Neurologist about possibly using Prazosin   as opposed to Ativan and we are checking ferritin levels    400 mg of magnesium at night time is recommended but remember this also stimulants your bowels.             If you are not yet signed up for eCare, please speak with a team member at the front desk who would happy to assist you with this process.      eCare  enrollment will allow you access to the below benefits   You can make appointments online   View test results / Lab Results   Obtain a copy of our After Visit Summary (a summary of your visit today)     Your Test Results:  If labs were ordered today the results are expected to be available via eCare in about 5 days. If you have an active eCare account, this is how we will notify you of your results.     If you do not have an eCare account then your test results will be mailed to you within  about 14 days after your tests are completed. If your physician needs to change your care based on your results or is concerned, you will receive a phone call.     If you have any questions about your test results please schedule an appointment with your provider.    **If it has been more than 2 weeks and you have not received your test results please send our office a message via eCare.    Medication Refills: If you need a prescription refilled, please contact your pharmacy 1 week before your current supply will run out to request the refill.  Contacting your pharmacy is the fastest and safest way to obtain a medication refill.  The pharmacy will notify our office.  Please note, that a minimum of 48 to 72 hours is needed to refill a medication, but refills are usually processed and sent to your pharmacy in about 5 business days.  Please call your pharmacy early to allow enough time to refill before you anticipate running out.  For faster medication refills, you can also schedule an appointment with your provider.    We know you have a choice in where you receive your healthcare and we sincerely thank you for trusting [facility] with your health.

## 2014-06-25 NOTE — Progress Notes (Signed)
Reason for visit: wellness   Reviewed eCare status with Patient:  NO    HEALTH MAINTENANCE:  Has the patient had any of these since their last visit?    Cervical screening/PAP: N/A     Mammo: N/A    Colon Screen: FIT KIT pending    Have you seen a specialist since your last visit: No        HM Due:  Health Maintenance   Topic Date Due   . HEPATITIS C SCREENING  06/16/1964   . TETANUS BOOSTER  04/24/1976   . HIV SCREENING  04/25/1979   . INFLUENZA VACCINE (1) 02/22/2014   . COLON CANCER SCREENING,FOBT/FIT  04/24/2014   . CHOLESTEROL- MALE  11/13/2018           No future appointments.

## 2014-06-27 ENCOUNTER — Other Ambulatory Visit (INDEPENDENT_AMBULATORY_CARE_PROVIDER_SITE_OTHER): Payer: Self-pay | Admitting: Family

## 2014-06-27 ENCOUNTER — Encounter (INDEPENDENT_AMBULATORY_CARE_PROVIDER_SITE_OTHER): Payer: Self-pay | Admitting: Family

## 2014-06-27 MED ORDER — LEVOTHYROXINE SODIUM 88 MCG OR TABS
ORAL_TABLET | ORAL | Status: DC
Start: 2014-06-27 — End: 2015-02-13

## 2014-06-27 NOTE — Addendum Note (Signed)
Addended by: Eliane Decree on: 06/27/2014 09:06 AM     Modules accepted: Orders

## 2014-06-28 NOTE — Telephone Encounter (Signed)
Replied

## 2014-06-29 ENCOUNTER — Encounter (INDEPENDENT_AMBULATORY_CARE_PROVIDER_SITE_OTHER): Payer: Self-pay

## 2014-06-30 ENCOUNTER — Other Ambulatory Visit (INDEPENDENT_AMBULATORY_CARE_PROVIDER_SITE_OTHER): Payer: Self-pay | Admitting: Family

## 2014-06-30 DIAGNOSIS — Z1211 Encounter for screening for malignant neoplasm of colon: Secondary | ICD-10-CM

## 2014-07-01 LAB — OCCULT BLOOD BY IA, STL: Occult Bld 1 Result: POSITIVE — AB

## 2014-07-04 ENCOUNTER — Encounter (INDEPENDENT_AMBULATORY_CARE_PROVIDER_SITE_OTHER): Payer: Self-pay | Admitting: Family

## 2014-07-04 ENCOUNTER — Other Ambulatory Visit (INDEPENDENT_AMBULATORY_CARE_PROVIDER_SITE_OTHER): Payer: Self-pay | Admitting: Family

## 2014-07-04 DIAGNOSIS — R195 Other fecal abnormalities: Secondary | ICD-10-CM

## 2014-07-04 DIAGNOSIS — Z1211 Encounter for screening for malignant neoplasm of colon: Secondary | ICD-10-CM

## 2014-07-14 ENCOUNTER — Ambulatory Visit (INDEPENDENT_AMBULATORY_CARE_PROVIDER_SITE_OTHER): Payer: BLUE CROSS/BLUE SHIELD | Admitting: Family Medicine

## 2014-07-14 ENCOUNTER — Encounter (INDEPENDENT_AMBULATORY_CARE_PROVIDER_SITE_OTHER): Payer: BLUE CROSS/BLUE SHIELD | Admitting: Family Medicine

## 2014-07-14 VITALS — BP 142/94 | HR 80 | Temp 98.6°F | Resp 16 | Ht 70.0 in | Wt 261.0 lb

## 2014-07-14 DIAGNOSIS — J209 Acute bronchitis, unspecified: Secondary | ICD-10-CM

## 2014-07-14 MED ORDER — CLARITHROMYCIN 500 MG OR TABS
500.0000 mg | ORAL_TABLET | Freq: Two times a day (BID) | ORAL | Status: DC
Start: 2014-07-14 — End: 2014-11-25

## 2014-07-14 NOTE — Patient Instructions (Signed)
It was a pleasure to see you in clinic today.                  If you are not yet signed up for eCare, please speak with a team member at the front desk who would happy to assist you with this process.      eCare  enrollment will allow you access to the below benefits   You can make appointments online   View test results / Lab Results   Obtain a copy of our After Visit Summary (a summary of your visit today)     Your Test Results:  If labs were ordered today the results are expected to be available via eCare in about 5 days. If you have an active eCare account, this is how we will notify you of your results.     If you do not have an eCare account then your test results will be mailed to you within about 14 days after your tests are completed. If your physician needs to change your care based on your results or is concerned, you will receive a phone call.     If you have any questions about your test results please schedule an appointment with your provider.    **If it has been more than 2 weeks and you have not received your test results please send our office a message via eCare.    Medication Refills: If you need a prescription refilled, please contact your pharmacy 1 week before your current supply will run out to request the refill.  Contacting your pharmacy is the fastest and safest way to obtain a medication refill.  The pharmacy will notify our office.  Please note, that a minimum of 48 to 72 hours is needed to refill a medication, but refills are usually processed and sent to your pharmacy in about 5 business days.  Please call your pharmacy early to allow enough time to refill before you anticipate running out.  For faster medication refills, you can also schedule an appointment with your provider.    We know you have a choice in where you receive your healthcare and we sincerely thank you for trusting  [facility] with your health.

## 2014-07-14 NOTE — Progress Notes (Signed)
Reason for visit: URI    Reviewed eCare status with Patient:  NO    HEALTH MAINTENANCE:  Has the patient had any of these since their last visit?    Cervical screening/PAP: N/A     Mammo: N/A    Colon Screen: UTD    Have you seen a specialist since your last visit: No        HM Due:   Health Maintenance   Topic Date Due   . COLON CANCER SCREENING,FOBT/FIT  07/01/2015   . CHOLESTEROL- MALE  06/26/2019   . TETANUS BOOSTER  06/25/2024   . INFLUENZA VACCINE  Addressed   . HEPATITIS C SCREENING  Addressed   . HIV SCREENING  Addressed           Future Appointments  Date Time Provider Department Center   07/14/2014 8:40 AM Diddee[facility]

## 2014-07-14 NOTE — Progress Notes (Signed)
SUBJECTIVE:  Anthony Andrade is an 49 year old male who presents with URI. Symptoms include   cough, fever, headache and fatigue. Onset 10 days, gradually worsening since   that time.   Geen,thick sputum  Decongestants havent helped  Also feels sinuses clogged  No fever medication today    Outpatient Prescriptions Prior to Visit   Medication Sig Dispense Refill   . Acetaminophen-Codeine 300-15 MG Oral Tab One or two tablets every 6 hours as needed. 56 tablet 0   . Albuterol Sulfate (2.5 MG/3ML) 0.083% Inhalation Nebu Soln Inhale 3 mL (2.5 mg) via nebulizer every 6 hours as needed for shortness of breath/wheezing. 100 vial 1   . Albuterol Sulfate HFA (PROAIR HFA) 108 (90 BASE) MCG/ACT Inhalation Aero Soln Inhale 2 puffs by mouth every 4 hours as needed (asthma). For Asthma. May use every 2 hours for attacks, or pereventively before exercise. 1 Inhaler 6   . Budesonide 0.5 MG/2ML Inhalation Suspension Inhale 2 mL (0.5 mg) via nebulizer every 12 hours. 120 mL 1   . Certolizumab Pegol (CIMZIA PREFILLED) 2 X 200 MG/ML Subcutaneous Kit Inject 200 mg under the skin every 2 weeks.     . Clotrimazole-Betamethasone 1-0.05 % External Cream Apply 1 application topically 2 times a day as needed (rash). Apply to buttocks 45 g 2   . DULoxetine HCl 60 MG Oral CAPSULE ENTERIC COATED PARTICLES   0   . Esomeprazole Magnesium 40 MG Oral CAPSULE DELAYED RELEASE One tablet twice per day on empty stomach 180 capsule 1   . Fexofenadine-Pseudoephed ER 60-120 MG Oral TABLET SR 12 HR Take 1 tablet by mouth every 12 hours as needed for allergies. 30 tablet 1   . Fluticasone Propionate 50 MCG/ACT Nasal Suspension Spray 2 sprays into each nostril 2 times a day. 1 bottle 3   . Formoterol Fumarate 20 MCG/2ML Inhalation Nebu Soln Inhale 2 mL via nebulizer 2 times a day. 60 mL 1   . Ipratropium-Albuterol 20-100 MCG/ACT Inhalation Aero Soln Inhale 2 puffs by mouth every 4 hours as needed (cough). 1 Inhaler 11   . Levothyroxine Sodium 88 MCG  Oral Tab One tab on empty stomach daily 90 tablet 1   . LORazepam 2 MG Oral Tab Take 1 tablet (2 mg) by mouth 2 times a day. 90 tablet 4   . Losartan Potassium 50 MG Oral Tab One tablet daily by mouth 90 tablet 3   . Meloxicam 15 MG Oral Tab 1 tablet (15 mg) daily. 90 tablet 0   . Montelukast Sodium 10 MG Oral Tab Take 1 tablet (10 mg) by mouth every evening. 90 tablet 3   . Olopatadine HCl 0.2 % Ophthalmic Solution Place 1 drop in each EYE daily. 2.5 mL 2   . Olopatadine HCl 0.6 % Nasal Solution Spray 2 sprays into each nostril 2 times a day. 1 bottle 11   . PNEUMOVAX 23 25 MCG/0.5ML Injection Injection   0   . Pregabalin (LYRICA) 75 MG Oral Cap Three tablets at dinner and three tablets at bedtime 180 capsule 0   . Respiratory Therapy Supplies (NEBULIZER) Does not apply Device Use every 6 hours as needed for other (chronic bronchitis). Chronic bronchitis 1 Device 0   . Spacer/Aero-Holding Chambers (AEROCHAMBER Z-STAT PLUS) Misc Use with inhaler as directed. 1 Device 1     No facility-administered medications prior to visit.     Review of patient's allergies indicates:  Allergies   Allergen Reactions   . Morphine  Nausea/Vomiting   . Penicillins Rash       History   Substance Use Topics   . Smoking status: Former Research scientist (life sciences)   . Smokeless tobacco: Not on file   . Alcohol Use: No       OBJECTIVE:  BP 142/94 mmHg  Pulse 80  Temp(Src) 98.6 F (37 C) (Temporal)  Resp 16  Ht '5\' 10"'  (1.778 m)  Wt 261 lb (118.389 kg)  BMI 37.45 kg/m2  General appearance: healthy, alert, no distress  Ears: R TM - normal, L TM - normal  Nose: normal  Oropharynx: normal  Neck: supple and no adenopathy  Lungs: clear to auscultation  Heart: normal rate, regular rhythm and no murmurs, clicks, or gallops    ASSESSMENT:  Acute bronchitis    PLAN:  1)  Symptomatic treatment with fluids, vaporizer, acetaminophen.  2)  OTC cough medications.  3)  Recheck as needed for persistence, worsening, appearance of new   symptoms.    PE:  Discussed viral  etiology of URI and treatment rationale.

## 2014-07-26 ENCOUNTER — Telehealth (HOSPITAL_BASED_OUTPATIENT_CLINIC_OR_DEPARTMENT_OTHER): Payer: Self-pay

## 2014-07-26 ENCOUNTER — Ambulatory Visit (INDEPENDENT_AMBULATORY_CARE_PROVIDER_SITE_OTHER): Payer: BLUE CROSS/BLUE SHIELD | Admitting: Family

## 2014-07-26 ENCOUNTER — Encounter (INDEPENDENT_AMBULATORY_CARE_PROVIDER_SITE_OTHER): Payer: Self-pay | Admitting: Family

## 2014-07-26 VITALS — BP 132/88 | HR 84 | Temp 98.9°F | Wt 257.6 lb

## 2014-07-26 DIAGNOSIS — R059 Cough, unspecified: Secondary | ICD-10-CM

## 2014-07-26 DIAGNOSIS — R05 Cough: Secondary | ICD-10-CM

## 2014-07-26 DIAGNOSIS — T7840XD Allergy, unspecified, subsequent encounter: Secondary | ICD-10-CM

## 2014-07-26 DIAGNOSIS — R053 Chronic cough: Secondary | ICD-10-CM

## 2014-07-26 DIAGNOSIS — J309 Allergic rhinitis, unspecified: Secondary | ICD-10-CM

## 2014-07-26 MED ORDER — OLOPATADINE HCL 0.6 % NA SOLN
2.0000 | Freq: Two times a day (BID) | NASAL | Status: DC
Start: 2014-07-26 — End: 2016-01-01

## 2014-07-26 MED ORDER — FORMOTEROL FUMARATE 20 MCG/2ML IN NEBU
2.0000 mL | INHALATION_SOLUTION | Freq: Two times a day (BID) | RESPIRATORY_TRACT | Status: DC
Start: 2014-07-26 — End: 2015-06-14

## 2014-07-26 MED ORDER — FEXOFENADINE-PSEUDOEPHED ER 60-120 MG OR TB12
1.0000 | EXTENDED_RELEASE_TABLET | Freq: Two times a day (BID) | ORAL | Status: DC | PRN
Start: 2014-07-26 — End: 2015-08-09

## 2014-07-26 MED ORDER — BUDESONIDE 0.5 MG/2ML IN SUSP
0.5000 mg | Freq: Two times a day (BID) | RESPIRATORY_TRACT | Status: DC
Start: 2014-07-26 — End: 2017-08-25

## 2014-07-26 NOTE — Progress Notes (Signed)
Subjective    Anthony Andrade is  here with chief complaint:    1) Cough has reoccurred  HPI of the above condition (1-3):   Started with a cold. 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 He then started with bronchitis x 2 weeks. Started back on his inhaler but not improving. Denies fever. Fatigue, sinus drainage and night time interruption of sleep are all present.     ROS:   Mild gastric reflux   No ear pain  No sore throat  No nausea      Patient Active Problem List   Diagnosis   . DEPRESSIVE DISORDER NEC   . ALLERGIC RHINITIS NOS   . ANKYLOSING SPONDYLITIS   . Chronic pain   . Hypertension   . Hypothyroidism   . Foot pain   . Other acquired absence of organ   . Other postprocedural status(V45.89)   . Unspecified sleep apnea   . Asthma   . Cough   . Allergic rhinitis due to allergen   . Restless legs syndrome   . Anterior corneal dystrophy   . Sleep apnea     Current Outpatient Prescriptions   Medication Sig Dispense Refill   . Acetaminophen-Codeine 300-15 MG Oral Tab One or two tablets every 6 hours as needed. 56 tablet 0   . Albuterol Sulfate (2.5 MG/3ML) 0.083% Inhalation Nebu Soln Inhale 3 mL (2.5 mg) via nebulizer every 6 hours as needed for shortness of breath/wheezing. 100 vial 1   . Albuterol Sulfate HFA (PROAIR HFA) 108 (90 BASE) MCG/ACT Inhalation Aero Soln Inhale 2 puffs by mouth every 4 hours as needed (asthma). For Asthma. May use every 2 hours for attacks, or pereventively before exercise. 1 Inhaler 6   . Budesonide 0.5 MG/2ML Inhalation Suspension Inhale 2 mL (0.5 mg) via nebulizer every 12 hours. 120 mL 1   . Certolizumab Pegol (CIMZIA PREFILLED) 2 X 200 MG/ML Subcutaneous Kit Inject 200 mg under the skin every 2 weeks.     . Clarithromycin 500 MG Oral Tab Take 1 tablet (500 mg) by mouth every 12 hours. For infection. Take until gone. 20 tablet 0   . Clotrimazole-Betamethasone 1-0.05 % External Cream Apply 1 application topically 2 times a day as needed (rash). Apply to buttocks 45 g 2   .  DULoxetine HCl 60 MG Oral CAPSULE ENTERIC COATED PARTICLES   0   . Esomeprazole Magnesium 40 MG Oral CAPSULE DELAYED RELEASE One tablet twice per day on empty stomach 180 capsule 1   . Fexofenadine-Pseudoephed ER 60-120 MG Oral TABLET SR 12 HR Take 1 tablet by mouth every 12 hours as needed for allergies. 30 tablet 1   . Fluticasone Propionate 50 MCG/ACT Nasal Suspension Spray 2 sprays into each nostril 2 times a day. 1 bottle 3   . Formoterol Fumarate 20 MCG/2ML Inhalation Nebu Soln Inhale 2 mL via nebulizer 2 times a day. 60 mL 1   . Ipratropium-Albuterol 20-100 MCG/ACT Inhalation Aero Soln Inhale 2 puffs by mouth every 4 hours as needed (cough). 1 Inhaler 11   . Levothyroxine Sodium 88 MCG Oral Tab One tab on empty stomach daily 90 tablet 1   . LORazepam 2 MG Oral Tab Take 1 tablet (2 mg) by mouth 2 times a day. 90 tablet 4   . Losartan Potassium 50 MG Oral Tab One tablet daily by mouth 90 tablet 3   . Meloxicam 15 MG Oral Tab 1 tablet (15 mg) daily. 90 tablet 0   .  Montelukast Sodium 10 MG Oral Tab Take 1 tablet (10 mg) by mouth every evening. 90 tablet 3   . Olopatadine HCl 0.2 % Ophthalmic Solution Place 1 drop in each EYE daily. 2.5 mL 2   . Olopatadine HCl 0.6 % Nasal Solution Spray 2 sprays into each nostril 2 times a day. 1 bottle 11   . PNEUMOVAX 23 25 MCG/0.5ML Injection Injection   0   . Pregabalin (LYRICA) 75 MG Oral Cap Three tablets at dinner and three tablets at bedtime 180 capsule 0   . Respiratory Therapy Supplies (NEBULIZER) Does not apply Device Use every 6 hours as needed for other (chronic bronchitis). Chronic bronchitis 1 Device 0   . Spacer/Aero-Holding Chambers (AEROCHAMBER Z-STAT PLUS) Misc Use with inhaler as directed. 1 Device 1     No current facility-administered medications for this visit.       Allergies-Morphine and Penicillins    History sections of chart reviewed and updated today: Yes    Objective  PHYSICAL EXAM:  General: Pleasant, no distress, vitals stable   Skin:  negative  Head: positive findings: Lips, mucosa, and tongue normal. Teeth and gums normal., positive findings: mild oropharyngeal erythema  Eyes: Negative, Lids/periorbital skin normal, Conjunctivae/corneas clear  Ears: External ears normal. Canals clear. TM's normal.  Nose:cloudy secretions in nose  Oropharynx: Lips, mucosa, and tongue normal. Teeth and gums normal.  Neck: supple. No adenopathy. Thyroid symmetric, normal size, without nodules and thick neck  Lungs: clear to auscultation and percussion  Heart: normal rate, regular rhythm and no murmurs, clicks, or gallops  Back: negative  Abd: obese; non tender  Mental:  General appearance: casually dressed  Behavior/Activity: calm and cooperative  Speech: clear, coherent, fluent.  Affect: normal affect    Medical Decision Making:    moderate complexity       ASSESSMENT/PLAN:    I spent a total time of 25 minutes face-to-face with the patient, of which more than 50% was spent counseling and coordinating care as outlined in this note. We reviewed last years course of 3+ month cough. We started on Dr Marylyn Ishihara plan which was effective. Also ask the patient to stay on his PPI    1. Persistent cough for 3 weeks or longer    - Formoterol Fumarate 20 MCG/2ML Inhalation Nebu Soln; Inhale 2 mL via nebulizer 2 times a day.  Dispense: 60 mL; Refill: 1  - Budesonide 0.5 MG/2ML Inhalation Suspension; Inhale 2 mL (0.5 mg) via nebulizer every 12 hours.  Dispense: 120 mL; Refill: 1    2. Allergic rhinitis, unspecified allergic rhinitis type    - Formoterol Fumarate 20 MCG/2ML Inhalation Nebu Soln; Inhale 2 mL via nebulizer 2 times a day.  Dispense: 60 mL; Refill: 1  - Budesonide 0.5 MG/2ML Inhalation Suspension; Inhale 2 mL (0.5 mg) via nebulizer every 12 hours.  Dispense: 120 mL; Refill: 1    3. Cough    - Formoterol Fumarate 20 MCG/2ML Inhalation Nebu Soln; Inhale 2 mL via nebulizer 2 times a day.  Dispense: 60 mL; Refill: 1  - Budesonide 0.5 MG/2ML Inhalation Suspension; Inhale  2 mL (0.5 mg) via nebulizer every 12 hours.  Dispense: 120 mL; Refill: 1  - Olopatadine HCl 0.6 % Nasal Solution; Spray 2 sprays into each nostril 2 times a day.  Dispense: 1 bottle; Refill: 11    4. Allergy, subsequent encounter    - Budesonide 0.5 MG/2ML Inhalation Suspension; Inhale 2 mL (0.5 mg) via nebulizer every 12 hours.  Dispense: 120 mL; Refill: 1  - Fexofenadine-Pseudoephed ER 60-120 MG Oral TABLET SR 12 HR; Take 1 tablet by mouth every 12 hours as needed for allergies.  Dispense: 30 tablet; Refill: 1          Patient was provided patient education and follow up instructions on AVS    Health Maintenance reviewed -There are no preventive care reminders to display for this patient.

## 2014-07-26 NOTE — Telephone Encounter (Signed)
OK to schedule at any site.

## 2014-07-26 NOTE — Progress Notes (Signed)
Reason for visit: follow up on bronchitis   Reviewed eCare status with Patient:  NO    HEALTH MAINTENANCE:  Has the patient had any of these since their last visit?    Cervical screening/PAP: N/A     Mammo: N/A    Colon Screen: N/A    Have you seen a specialist since your last visit: No        HM Due:   Health Maintenance   Topic Date Due   . COLON CANCER SCREENING,FOBT/FIT  07/01/2015   . CHOLESTEROL- MALE  06/26/2019   . TETANUS BOOSTER  06/25/2024   . INFLUENZA VACCINE  Addressed   . HEPATITIS C SCREENING  Addressed   . HIV SCREENING  Addressed           No future appointments.

## 2014-07-26 NOTE — Telephone Encounter (Signed)
Called and left a detail message for the patient to call back to schedule colonoscopy waiting to hear back form the patient

## 2014-07-26 NOTE — Patient Instructions (Addendum)
It was a pleasure to see you in clinic today.               STEAM  MASK  HONEY COUGH DROPS  64 oz of water daily  Nebulizer in AM and PM    See Dr Roselee NovaAltman       If you are not yet signed up for eCare, please speak with a team member at the front desk who would happy to assist you with this process.      eCare  enrollment will allow you access to the below benefits   You can make appointments online   View test results / Lab Results   Obtain a copy of our After Visit Summary (a summary of your visit today)     Your Test Results:  If labs were ordered today the results are expected to be available via eCare in about 5 days. If you have an active eCare account, this is how we will notify you of your results.     If you do not have an eCare account then your test results will be mailed to you within about 14 days after your tests are completed. If your physician needs to change your care based on your results or is concerned, you will receive a phone call.     If you have any questions about your test results please schedule an appointment with your provider.    **If it has been more than 2 weeks and you have not received your test results please send our office a message via eCare.    Medication Refills: If you need a prescription refilled, please contact your pharmacy 1 week before your current supply will run out to request the refill.  Contacting your pharmacy is the fastest and safest way to obtain a medication refill.  The pharmacy will notify our office.  Please note, that a minimum of 48 to 72 hours is needed to refill a medication, but refills are usually processed and sent to your pharmacy in about 5 business days.  Please call your pharmacy early to allow enough time to refill before you anticipate running out.  For faster medication refills, you can also schedule an appointment with your provider.    We know you have a choice in where you receive your healthcare and we sincerely thank you for [facility] with your health.

## 2014-07-26 NOTE — Telephone Encounter (Signed)
**  did not schedule, [facility]. Patient indicated that he takes Lorazepam each night. Please review and advise if this is clinic to schedule or if we are ok to finalize this;         Referral Intake/Notification to Clinic    Hello All,    The following patient has been scheduled for a(n) Colonoscopy    MRN: [id number]  DATE AND TIME:   REFERRAL NUMBER: [id number]  PROVIDER:   PHONE NUMBER:  [phone]    Prior Colonoscopy procedure? YES:  Name of Facility/Hospital: Unknown, Reason for visit: diagnostic colonoscopy and Approximate month/year: 10 years ago  Diabetic? NO  Are you using any blood thinners or anticoagulation medications such as   (ensure you are mentioning brand and generic name to patient)       -Coumadin (warfarin, war-furr-in)   NO       -Plavix (clopidogrel, kloh-pid-oh-grel) NO       -Fragmin (dalteparin, dall-tep-arin) NO       -Effient (prasugrel, prah-soo-grel) NO       -Lovenox(enoxaparin, e-nox-a-pare-in) NO       -Xarelto (rivaroxaban, reev-a-rox-a-ban) NO       -Pradaxa (dabigatran, da-bye-ga-tran) NO  Are you allergic to either of the following medications?      -Versed (a sedative): NO      -Fentanyl (a narcotic pain reliever): NO  Have you been diagnosed with sleep apnea? YES  CPAP  Have you been told you're a difficult IV to start? NO  Currently experiencing rectal bleeding? NO  Currently experiencing constipation lasting longer than 3 days? NO  Do you have any mobility restrictions, such as a cane, walker, or wheelchair, where you might need assistance transferring to a stretcher? NO     Chronic health condition(s) that impact scheduling:      -End Stage Renal Disease (ESRD): NO  -Currently on Kidney Dialysis: NO  -Pacemaker  NO  -Implantable Defibrillator (ID):  NO  -Heavy Narcotic Usage:  YES- Lorazepam each night  -Because you will be under moderate sedation, we would like to know if you             have had any issues with this type of sedation in the past: NO  -Have you  ever had an organ transplant? ([facility] and [place] only) NO    Height: 5'10    Weight: 260  BMI: 37.3     Patient's Preferred Pharmacy Location: [facility] [address] in [place]  E-mail address (If scheduled at [facility] or [facility] ONLY): [email or web address]      Patient advised of escort: YES  Patient advised on how to receive prep instructions:  YES      Thank You,  Katie  Referral Team      **PLEASE NOTE**    When replying to this message,   You must CC the following pool:     [facility] SCHEDULING [facility]     Or the referral team will not receive the message

## 2014-07-28 ENCOUNTER — Encounter (INDEPENDENT_AMBULATORY_CARE_PROVIDER_SITE_OTHER): Payer: Self-pay | Admitting: Family

## 2014-07-29 NOTE — Telephone Encounter (Signed)
Routing to Kerry Meyer to assist.

## 2014-07-29 NOTE — Telephone Encounter (Signed)
Will defer to [place]. Please let patient know that Nash DimmerKerry will be back next Monday.

## 2014-07-29 NOTE — Telephone Encounter (Signed)
Forwarding patient eCare message to [employer] Meyer. Patient requesting advise due to sx and a letter for work.

## 2014-08-04 ENCOUNTER — Encounter (INDEPENDENT_AMBULATORY_CARE_PROVIDER_SITE_OTHER): Payer: Self-pay | Admitting: Family

## 2014-08-05 NOTE — Telephone Encounter (Signed)
Letter mailed

## 2014-08-05 NOTE — Telephone Encounter (Signed)
Patient request revisions to her previous work excuse letter.

## 2014-09-08 ENCOUNTER — Encounter (INDEPENDENT_AMBULATORY_CARE_PROVIDER_SITE_OTHER): Payer: BLUE CROSS/BLUE SHIELD | Admitting: Internal Medicine

## 2014-09-09 ENCOUNTER — Encounter (INDEPENDENT_AMBULATORY_CARE_PROVIDER_SITE_OTHER): Payer: Self-pay | Admitting: Internal Medicine

## 2014-09-23 ENCOUNTER — Encounter (HOSPITAL_BASED_OUTPATIENT_CLINIC_OR_DEPARTMENT_OTHER): Payer: BLUE CROSS/BLUE SHIELD | Admitting: Allergy & Immunology

## 2014-10-16 ENCOUNTER — Other Ambulatory Visit (INDEPENDENT_AMBULATORY_CARE_PROVIDER_SITE_OTHER): Payer: Self-pay | Admitting: Family Medicine

## 2014-10-18 ENCOUNTER — Encounter (INDEPENDENT_AMBULATORY_CARE_PROVIDER_SITE_OTHER): Payer: BLUE CROSS/BLUE SHIELD | Admitting: Family

## 2014-10-18 NOTE — Telephone Encounter (Signed)
Will defer to Nash DimmerKerry  Please let patient know she will be in on wednesday

## 2014-10-18 NOTE — Telephone Encounter (Signed)
This medication is outside of the [facility]'s protocols. Please sign and close the encounter if you approve: clarithromycin    Acute antibiotic therapy is outside [facility] protocol.    If this medication is denied please have your staff inform the patient and schedule an appointment if necessary.

## 2014-10-19 ENCOUNTER — Encounter (INDEPENDENT_AMBULATORY_CARE_PROVIDER_SITE_OTHER): Payer: Self-pay | Admitting: Family

## 2014-10-21 NOTE — Progress Notes (Signed)
Mr. Rueb did not cancel and was not present for a scheduled appointment today.  Disposition: no show

## 2014-10-25 ENCOUNTER — Other Ambulatory Visit: Payer: Self-pay

## 2014-10-28 ENCOUNTER — Encounter (INDEPENDENT_AMBULATORY_CARE_PROVIDER_SITE_OTHER): Payer: BLUE CROSS/BLUE SHIELD | Admitting: Internal Medicine

## 2014-11-07 ENCOUNTER — Other Ambulatory Visit (INDEPENDENT_AMBULATORY_CARE_PROVIDER_SITE_OTHER): Payer: Self-pay | Admitting: Family

## 2014-11-07 DIAGNOSIS — R42 Dizziness and giddiness: Secondary | ICD-10-CM

## 2014-11-07 MED ORDER — MECLIZINE HCL 25 MG OR TABS
50.0000 mg | ORAL_TABLET | Freq: Two times a day (BID) | ORAL | Status: DC | PRN
Start: 2014-11-07 — End: 2014-11-23

## 2014-11-09 ENCOUNTER — Ambulatory Visit (INDEPENDENT_AMBULATORY_CARE_PROVIDER_SITE_OTHER): Payer: BLUE CROSS/BLUE SHIELD | Admitting: Family

## 2014-11-09 ENCOUNTER — Inpatient Hospital Stay: Payer: Self-pay

## 2014-11-09 ENCOUNTER — Encounter (INDEPENDENT_AMBULATORY_CARE_PROVIDER_SITE_OTHER): Payer: Self-pay | Admitting: Family

## 2014-11-09 VITALS — BP 150/94 | HR 87 | Temp 98.3°F

## 2014-11-09 DIAGNOSIS — R42 Dizziness and giddiness: Secondary | ICD-10-CM

## 2014-11-09 DIAGNOSIS — R262 Difficulty in walking, not elsewhere classified: Secondary | ICD-10-CM

## 2014-11-09 DIAGNOSIS — G43A0 Cyclical vomiting, not intractable: Secondary | ICD-10-CM

## 2014-11-09 DIAGNOSIS — R1115 Cyclical vomiting syndrome unrelated to migraine: Secondary | ICD-10-CM

## 2014-11-09 DIAGNOSIS — R404 Transient alteration of awareness: Secondary | ICD-10-CM

## 2014-11-09 NOTE — Progress Notes (Signed)
Reason for visit: vertigo since 11/06/2014 - vomiting, spinning sensation, has only eaten 4 pieces of toast and drank 1 gatorade   Reviewed eCare status with Patient:  NO    HEALTH MAINTENANCE:  Has the patient had any of these since their last visit?    Cervical screening/PAP: N/A     Mammo: N/A    Colon Screen: N/A    Have you seen a specialist since your last visit: No        HM Due:   Health Maintenance   Topic Date Due   . Diabetes Screen  04/24/2004   . Colon Cancer Screen w/ FOBT/FIT  07/01/2015   . Cholesterol Test  06/26/2019   . Tetanus Vaccine  06/25/2024   . Influenza Vaccine  Addressed   . Hepatitis C Screen  Addressed   . HIV Screen  Addressed           No future appointments.

## 2014-11-09 NOTE — Patient Instructions (Signed)
It was a pleasure to see you in clinic today.                  If you are not yet signed up for eCare, please speak with a team member at the front desk who would happy to assist you with this process.      eCare  enrollment will allow you access to the below benefits   You can make appointments online   View test results / Lab Results   Obtain a copy of our After Visit Summary (a summary of your visit today)     Your Test Results:  If labs were ordered today the results are expected to be available via eCare in about 5 days. If you have an active eCare account, this is how we will notify you of your results.     If you do not have an eCare account then your test results will be mailed to you within about 14 days after your tests are completed. If your physician needs to change your care based on your results or is concerned, you will receive a phone call.     If you have any questions about your test results please schedule an appointment with your provider.    **If it has been more than 2 weeks and you have not received your test results please send our office a message via eCare.    Medication Refills: If you need a prescription refilled, please contact your pharmacy 1 week before your current supply will run out to request the refill.  Contacting your pharmacy is the fastest and safest way to obtain a medication refill.  The pharmacy will notify our office.  Please note, that a minimum of 48 to 72 hours is needed to refill a medication, but refills are usually processed and sent to your pharmacy in about 5 business days.  Please call your pharmacy early to allow enough time to refill before you anticipate running out.  For faster medication refills, you can also schedule an appointment with your provider.    We know you have a choice in where you receive your healthcare and we sincerely thank you for trusting [facility] with your health.

## 2014-11-10 NOTE — Progress Notes (Signed)
CC: Patient has been too busy to get up out of bed for 3 days with N/V,weakness and difficulty walking. WIfe notices change in mental status as well. He woke up Monday driving his daughter to work. Started feeling worse and went back home. Has not been able to keep any food or water down except very small amounts.     He can't move head more than a few inches without gagging and room spinning. Feels sleepy or groggy.     ROS:   SLeeping most of the time since MOnday. Missed 3 days of work.  No fever.   Ears do not hurt but feel stuffy.  Allergies have been fairly well controlled. Mod. Nasal drainage.   No pain in sinuses  No sore throat  + difficulty swallowing  No drooling.  Vision blurred; prefers to wear sunglasses but no light sensitivity  No chest pain or palpitations  No abdominal pain, no diarrhea     Neuro: face feels 'odd' hearing ok fuzzy in the head  Weakness in hands worse on right  Fear of falling can't stand without assistants  Unsure if he has weakness in legs just feels weak all over  Severe fatigue      Objective    Physical Exam   Constitutional: He appears lethargic. He appears toxic. He appears distressed.   HENT:   Head: Normocephalic.   Right Ear: Hearing normal. Tympanic membrane is not erythematous, not retracted and not bulging. A middle ear effusion is present.   Left Ear: Hearing normal. Tympanic membrane is not erythematous, not retracted and not bulging. A middle ear effusion is present.   Nose: Nose normal.   Mouth/Throat: Uvula is midline. Mucous membranes are dry.   Eyes: Conjunctivae are normal. Pupils are equal, round, and reactive to light. Left eye exhibits hordeolum. Right eye exhibits abnormal extraocular motion and nystagmus. Left eye exhibits normal extraocular motion and no nystagmus.   Neck: No rigidity.   Cardiovascular: Normal rate, regular rhythm and normal pulses.    Pulmonary/Chest: Effort normal and breath sounds normal.   Neurological: He appears lethargic. He  displays no tremor. A cranial nerve deficit and sensory deficit is present. He exhibits abnormal muscle tone. Coordination and gait abnormal.   Reflex Scores:       Tricep reflexes are 0 on the right side and 1+ on the left side.       Bicep reflexes are 0 on the right side and 1+ on the left side.  Mild right lip lag   Psychiatric: His affect is blunt. His speech is slurred. He is slowed. Cognition and memory are impaired.     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     I sent to the ER for hydration and further evaluation.  He was treated and released. Not much improvement.  He will undergo CT Scan without contrast of the brain if not improved on Thursday.    1. Severe dizziness  - CT SCAN HEAD W/O CONTRAST    2. Non-intractable cyclical vomiting with nausea  - CT SCAN HEAD W/O CONTRAST    3. Difficulty walking  - CT SCAN HEAD W/O CONTRAST    4. Transient alteration of awareness  - CT SCAN HEAD W/O CONTRAST

## 2014-11-11 ENCOUNTER — Encounter (INDEPENDENT_AMBULATORY_CARE_PROVIDER_SITE_OTHER): Payer: Self-pay

## 2014-11-14 ENCOUNTER — Encounter (INDEPENDENT_AMBULATORY_CARE_PROVIDER_SITE_OTHER): Payer: Self-pay | Admitting: Family

## 2014-11-14 ENCOUNTER — Ambulatory Visit (INDEPENDENT_AMBULATORY_CARE_PROVIDER_SITE_OTHER): Payer: Self-pay | Admitting: Family

## 2014-11-14 ENCOUNTER — Ambulatory Visit (INDEPENDENT_AMBULATORY_CARE_PROVIDER_SITE_OTHER): Payer: BLUE CROSS/BLUE SHIELD | Admitting: Family

## 2014-11-14 ENCOUNTER — Telehealth (INDEPENDENT_AMBULATORY_CARE_PROVIDER_SITE_OTHER): Payer: Self-pay | Admitting: Family

## 2014-11-14 VITALS — BP 134/88 | HR 90 | Temp 97.7°F

## 2014-11-14 DIAGNOSIS — H8123 Vestibular neuronitis, bilateral: Secondary | ICD-10-CM

## 2014-11-14 DIAGNOSIS — R29818 Other symptoms and signs involving the nervous system: Secondary | ICD-10-CM

## 2014-11-14 DIAGNOSIS — R4189 Other symptoms and signs involving cognitive functions and awareness: Secondary | ICD-10-CM

## 2014-11-14 DIAGNOSIS — R29898 Other symptoms and signs involving the musculoskeletal system: Secondary | ICD-10-CM

## 2014-11-14 DIAGNOSIS — R42 Dizziness and giddiness: Secondary | ICD-10-CM

## 2014-11-14 DIAGNOSIS — E039 Hypothyroidism, unspecified: Secondary | ICD-10-CM

## 2014-11-14 DIAGNOSIS — M6281 Muscle weakness (generalized): Secondary | ICD-10-CM

## 2014-11-14 LAB — PR A1C RAPID, ONSITE: Hemoglobin A1C: 6 % (ref 4.0–6.0)

## 2014-11-14 NOTE — Telephone Encounter (Signed)
Patient is on Bettey CostaKerry Meyers schedule twice today; 10:40am and 3:00pm. 1st attempt went straight to VM. Routing to [facility] and front desk to try patient again before first appointment to see which one he will be coming to. The other appointment can be cancelled.

## 2014-11-14 NOTE — Progress Notes (Signed)
Reason for visit: dizziness follow up   Reviewed eCare status with Patient:  NO    HEALTH MAINTENANCE:  Has the patient had any of these since their last visit?    Cervical screening/PAP: N/A     Mammo: N/A    Colon Screen: N/A    Have you seen a specialist since your last visit: No        HM Due:   Health Maintenance   Topic Date Due   . Colon Cancer Screen w/ FOBT/FIT  07/01/2015   . Diabetes Screen  07/31/2015   . Cholesterol Test  06/26/2019   . Tetanus Vaccine  06/25/2024   . Influenza Vaccine  Addressed   . Hepatitis C Screen  Addressed   . HIV Screen  Addressed           Future Appointments  Date Time Provider Department Center   11/14/2014 3:00 PM [name], [name], ARNP [facility]   11/15/2014 9:30 AM [facility] [place] [facility]

## 2014-11-14 NOTE — Patient Instructions (Addendum)
It was a pleasure to see you in clinic today.               See neurologist  Start Vestibular exercises with PT  Continue meclizine if  You can tolerate        If you are not yet signed up for eCare, please speak with a team member at the front desk who would happy to assist you with this process.      eCare  enrollment will allow you access to the below benefits   You can make appointments online   View test results / Lab Results   Obtain a copy of our After Visit Summary (a summary of your visit today)     Your Test Results:  If labs were ordered today the results are expected to be available via eCare in about 5 days. If you have an active eCare account, this is how we will notify you of your results.     If you do not have an eCare account then your test results will be mailed to you within about 14 days after your tests are completed. If your physician needs to change your care based on your results or is concerned, you will receive a phone call.     If you have any questions about your test results please schedule an appointment with your provider.    **If it has been more than 2 weeks and you have not received your test results please send our office a message via eCare.    Medication Refills: If you need a prescription refilled, please contact your pharmacy 1 week before your current supply will run out to request the refill.  Contacting your pharmacy is the fastest and safest way to obtain a medication refill.  The pharmacy will notify our office.  Please note, that a minimum of 48 to 72 hours is needed to refill a medication, but refills are usually processed and sent to your pharmacy in about 5 business days.  Please call your pharmacy early to allow enough time to refill before you anticipate running out.  For faster medication refills, you can also schedule an appointment with your provider.    We know you have a choice in where you receive your healthcare and we sincerely thank you for trusting [facility] with your health.

## 2014-11-14 NOTE — Telephone Encounter (Signed)
Patient did not show up to 1040 appt so it was cancelled. Closing TE

## 2014-11-14 NOTE — Progress Notes (Addendum)
CC: Follow up for Vertigo/vestibular neuritis    He is improving from his vestibular neuritis.  He is now eating small frequent meals and staying hydrated.  He can walk with minimal assistance.  Fatigue is resolving.  He continues to be concerned.  States that he has had some numbness and weakness in his right hand in particular for several months.  He thinks this may have been coming on for some time.  He has had elevated glucose levels in the past.  He has never had a history of falls, seizures, musculoskeletal or neurologic conditions such as MS.  He denies weakness in his lower legs.  He does admit to stress in the home and at work.    Meclizine is helping.  He would like to proceed with the CT scan.  He has that scheduled for tomorrow.    Patient Active Problem List   Diagnosis   . Depressive disorder, not elsewhere classified   . Allergic rhinitis, cause unspecified   . Ankylosing spondylitis ([place])   . Chronic pain   . Hypertension   . Hypothyroidism   . Foot pain   . Other acquired absence of organ   . Other postprocedural status(V45.89)   . Unspecified sleep apnea   . Asthma   . Cough   . Allergic rhinitis due to allergen   . Restless legs syndrome   . Anterior corneal dystrophy   . Sleep apnea   . Acute vestibular neuritis     Outpatient Prescriptions Prior to Visit   Medication Sig Dispense Refill   . Acetaminophen-Codeine 300-15 MG Oral Tab One or two tablets every 6 hours as needed. 56 tablet 0   . Albuterol Sulfate (2.5 MG/3ML) 0.083% Inhalation Nebu Soln Inhale 3 mL (2.5 mg) via nebulizer every 6 hours as needed for shortness of breath/wheezing. 100 vial 1   . Albuterol Sulfate HFA (PROAIR HFA) 108 (90 BASE) MCG/ACT Inhalation Aero Soln Inhale 2 puffs by mouth every 4 hours as needed (asthma). For Asthma. May use every 2 hours for attacks, or pereventively before exercise. 1 Inhaler 6   . Budesonide 0.5 MG/2ML Inhalation Suspension Inhale 2 mL (0.5 mg) via nebulizer every 12 hours. 120 mL 1   .  Certolizumab Pegol (CIMZIA PREFILLED) 2 X 200 MG/ML Subcutaneous Kit Inject 200 mg under the skin every 2 weeks.     . Clarithromycin 500 MG Oral Tab Take 1 tablet (500 mg) by mouth every 12 hours. For infection. Take until gone. 20 tablet 0   . Clotrimazole-Betamethasone 1-0.05 % External Cream Apply 1 application topically 2 times a day as needed (rash). Apply to buttocks 45 g 2   . DULoxetine HCl 60 MG Oral CAPSULE ENTERIC COATED PARTICLES   0   . Esomeprazole Magnesium 40 MG Oral CAPSULE DELAYED RELEASE One tablet twice per day on empty stomach 180 capsule 1   . Fexofenadine-Pseudoephed ER 60-120 MG Oral TABLET SR 12 HR Take 1 tablet by mouth every 12 hours as needed for allergies. 30 tablet 1   . Fluticasone Propionate 50 MCG/ACT Nasal Suspension Spray 2 sprays into each nostril 2 times a day. 1 bottle 3   . Formoterol Fumarate 20 MCG/2ML Inhalation Nebu Soln Inhale 2 mL via nebulizer 2 times a day. 60 mL 1   . Ipratropium-Albuterol 20-100 MCG/ACT Inhalation Aero Soln Inhale 2 puffs by mouth every 4 hours as needed (cough). 1 Inhaler 11   . Levothyroxine Sodium 88 MCG Oral Tab One tab on  empty stomach daily 90 tablet 1   . LORazepam 2 MG Oral Tab Take 1 tablet (2 mg) by mouth 2 times a day. 90 tablet 4   . Losartan Potassium 50 MG Oral Tab One tablet daily by mouth 90 tablet 3   . Meclizine HCl 25 MG Oral Tab Take 2 tablets (50 mg) by mouth 2 times a day as needed for dizziness or nausea/vomiting. 60 tablet 0   . Meloxicam 15 MG Oral Tab 1 tablet (15 mg) daily. 90 tablet 0   . Montelukast Sodium 10 MG Oral Tab Take 1 tablet (10 mg) by mouth every evening. 90 tablet 3   . Olopatadine HCl 0.2 % Ophthalmic Solution Place 1 drop in each EYE daily. 2.5 mL 2   . Olopatadine HCl 0.6 % Nasal Solution Spray 2 sprays into each nostril 2 times a day. 1 bottle 11   . PNEUMOVAX 23 25 MCG/0.5ML Injection Injection   0   . Pregabalin (LYRICA) 75 MG Oral Cap Three tablets at dinner and three tablets at bedtime 180 capsule 0    . Respiratory Therapy Supplies (NEBULIZER) Does not apply Device Use every 6 hours as needed for other (chronic bronchitis). Chronic bronchitis 1 Device 0   . Spacer/Aero-Holding Chambers (AEROCHAMBER Z-STAT PLUS) Misc Use with inhaler as directed. 1 Device 1     No facility-administered medications prior to visit.     Exam    Patient is smiling and alert and oriented today.  He is articulate.  Horner sign in his right eye has resolved.  No slurred speech.  Thoughts are logical.  Appears to be well-hydrated.  Mouth is moist   Cranial nerves are intact.  Romberg sign has improved but still slightly abnormal with a balance reduced to the left side.  Strength is intact in his upper extremities.  Gait is still a little unsteady but greatly improved  Heart rate is regular rate and rhythm with elevated blood pressure              Assessment  1. 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    Reassured patient  Go ahead with CT scan tomorrow  Follow-up in one week but continue to follow home care plan      Morning Medication  One capsule 75 mg of Cymbalta   One tablet 15 mg of Mobic   One tablet 88 mcg of Synthroid   One capsule 40 mg of Nexium     Night time    Three capsules 75 mg each of Lyrica with dinner     One capsule 40 mg of nexium at bedtime   Three capsules 75 mg each of Lyrica at bedtime   One tablet 50 mg of Cozaar at bedtime   One tablet 49m of Ativan at bedtime   One tablet 10 mg of Singulair at bedtime

## 2014-11-15 ENCOUNTER — Ambulatory Visit: Payer: BLUE CROSS/BLUE SHIELD | Attending: Family

## 2014-11-15 ENCOUNTER — Encounter (INDEPENDENT_AMBULATORY_CARE_PROVIDER_SITE_OTHER): Payer: Self-pay | Admitting: Family

## 2014-11-15 DIAGNOSIS — R42 Dizziness and giddiness: Secondary | ICD-10-CM

## 2014-11-15 DIAGNOSIS — R404 Transient alteration of awareness: Secondary | ICD-10-CM

## 2014-11-15 DIAGNOSIS — R262 Difficulty in walking, not elsewhere classified: Secondary | ICD-10-CM

## 2014-11-15 DIAGNOSIS — G43A0 Cyclical vomiting, not intractable: Secondary | ICD-10-CM

## 2014-11-15 LAB — ANTI THYROID PEROXIDASE: Anti Thyroid Peroxidase: 2.8 [IU]/mL (ref 0.0–8.9)

## 2014-11-15 LAB — T4, FREE: Thyroxine (Free): 0.8 ng/dL (ref 0.6–1.2)

## 2014-11-15 NOTE — Telephone Encounter (Signed)
Forwarding patient eCare message to Kerry Meyer.

## 2014-11-15 NOTE — Progress Notes (Signed)
Quick Note:      Your lab results is normal.   Please let me know if you have any question.    ______

## 2014-11-15 NOTE — Telephone Encounter (Signed)
Forwarding patient eCare message to referral pool and Kerry Meyer

## 2014-11-16 DIAGNOSIS — H8120 Vestibular neuronitis, unspecified ear: Secondary | ICD-10-CM | POA: Insufficient documentation

## 2014-11-16 NOTE — Telephone Encounter (Signed)
Referral processed and faxed to [facility]   Patient notified via eCare by provider.

## 2014-11-18 ENCOUNTER — Telehealth (INDEPENDENT_AMBULATORY_CARE_PROVIDER_SITE_OTHER): Payer: Self-pay | Admitting: Family

## 2014-11-18 NOTE — Telephone Encounter (Signed)
CONFIRMED PHONE NUMBER: [phone] reference: [phone]  CALLERS FIRST AND LAST NAME: Arnella  [facility] T[facility]: na  CALLERS RELATIONSHIP:OTHER:   RETURN CALL: OK to leave detailed message with anyone that answers    SUBJECT: General Message   REASON FOR REQUEST: Paperwork    MESSAGE: Arnella calling to follow up on paperwork that was sent regarding patient's Short Term Disability on 08/18/2014.  Please contact to discuss.  Thank you.

## 2014-11-18 NOTE — Telephone Encounter (Signed)
Left detailed message asking for a call back with clarification.   CCR:  If patient returns phone call, please transfer call to an [place] medical assistant at extension [phone]. If no one is available to answer, please take a message and forward the TE to the [facility] Clinical Staff Pool.

## 2014-11-18 NOTE — Telephone Encounter (Signed)
[facility].

## 2014-11-19 NOTE — Telephone Encounter (Signed)
Document is complete with my information.  Please print out [place] visit at [place], my notes from May 18 and May 23.  Also needs copy of any lab work drawn here.  Also had a CT scan that needs to be sent.

## 2014-11-22 ENCOUNTER — Encounter (INDEPENDENT_AMBULATORY_CARE_PROVIDER_SITE_OTHER): Payer: Self-pay | Admitting: Family

## 2014-11-22 NOTE — Telephone Encounter (Signed)
Routing to Team Pool to assist, thank you!

## 2014-11-22 NOTE — Telephone Encounter (Signed)
ER visit, chart notes, lab results, and CT attached to form and placed in Bettey CostaKerry Meyers in box for signature.

## 2014-11-22 NOTE — Telephone Encounter (Signed)
Forms completed

## 2014-11-22 NOTE — Telephone Encounter (Signed)
Received the completed [employer] form from Dr. Daphane ShepherdMeyer. A copy of this form will be kept for 90 days.  Form faxed back to [employer] at  fax # [phone]. (Only if the form is for L & I, FMLA, or MVA a copy should be placed in Urgent Scan Pile)    Closing TE

## 2014-11-23 ENCOUNTER — Ambulatory Visit (INDEPENDENT_AMBULATORY_CARE_PROVIDER_SITE_OTHER): Payer: BLUE CROSS/BLUE SHIELD | Admitting: Family

## 2014-11-23 ENCOUNTER — Encounter (INDEPENDENT_AMBULATORY_CARE_PROVIDER_SITE_OTHER): Payer: Self-pay | Admitting: Family

## 2014-11-23 ENCOUNTER — Ambulatory Visit (INDEPENDENT_AMBULATORY_CARE_PROVIDER_SITE_OTHER): Payer: Self-pay | Admitting: Family

## 2014-11-23 VITALS — BP 136/88 | HR 88 | Temp 97.9°F | Wt 259.0 lb

## 2014-11-23 DIAGNOSIS — I10 Essential (primary) hypertension: Secondary | ICD-10-CM

## 2014-11-23 DIAGNOSIS — R1115 Cyclical vomiting syndrome unrelated to migraine: Secondary | ICD-10-CM

## 2014-11-23 DIAGNOSIS — G43A0 Cyclical vomiting, not intractable: Secondary | ICD-10-CM

## 2014-11-23 DIAGNOSIS — R42 Dizziness and giddiness: Secondary | ICD-10-CM

## 2014-11-23 DIAGNOSIS — F329 Major depressive disorder, single episode, unspecified: Secondary | ICD-10-CM

## 2014-11-23 DIAGNOSIS — F419 Anxiety disorder, unspecified: Secondary | ICD-10-CM

## 2014-11-23 DIAGNOSIS — F32A Depression, unspecified: Secondary | ICD-10-CM

## 2014-11-23 MED ORDER — MECLIZINE HCL 25 MG OR TABS
50.0000 mg | ORAL_TABLET | Freq: Two times a day (BID) | ORAL | Status: DC | PRN
Start: 2014-11-23 — End: 2015-07-28

## 2014-11-23 MED ORDER — ONDANSETRON 8 MG OR TBDP
8.0000 mg | ORAL_TABLET | Freq: Three times a day (TID) | ORAL | Status: DC | PRN
Start: 2014-11-23 — End: 2015-07-28

## 2014-11-23 NOTE — Progress Notes (Signed)
Reason for visit: routine f/u   Reviewed eCare status with Patient:  NO    HEALTH MAINTENANCE:  Has the patient had any of these since their last visit?    Cervical screening/PAP: N/A     Mammo: N/A    Colon Screen: N/A    Have you seen a specialist since your last visit: No        HM Due: none  Health Maintenance   Topic Date Due   . Colon Cancer Screen w/ FOBT/FIT  07/01/2015   . Diabetes Screen  11/13/2017   . Cholesterol Test  06/26/2019   . Tetanus Vaccine  06/25/2024   . Influenza Vaccine  Addressed   . Hepatitis C Screen  Addressed   . HIV Screen  Addressed           Future Appointments  Date Time Provider Department Center   11/23/2014 5:00 PM Daphane ShepherdMeyer, Delcie RochKerry Ellen, ARNP [facility]

## 2014-11-23 NOTE — Patient Instructions (Signed)
It was a pleasure to see you in clinic today.                  If you are not yet signed up for eCare, please speak with a team member at the front desk who would happy to assist you with this process.      eCare  enrollment will allow you access to the below benefits   You can make appointments online   View test results / Lab Results   Obtain a copy of our After Visit Summary (a summary of your visit today)     Your Test Results:  If labs were ordered today the results are expected to be available via eCare in about 5 days. If you have an active eCare account, this is how we will notify you of your results.     If you do not have an eCare account then your test results will be mailed to you within about 14 days after your tests are completed. If your physician needs to change your care based on your results or is concerned, you will receive a phone call.     If you have any questions about your test results please schedule an appointment with your provider.    **If it has been more than 2 weeks and you have not received your test results please send our office a message via eCare.    Medication Refills: If you need a prescription refilled, please contact your pharmacy 1 week before your current supply will run out to request the refill.  Contacting your pharmacy is the fastest and safest way to obtain a medication refill.  The pharmacy will notify our office.  Please note, that a minimum of 48 to 72 hours is needed to refill a medication, but refills are usually processed and sent to your pharmacy in about 5 business days.  Please call your pharmacy early to allow enough time to refill before you anticipate running out.  For faster medication refills, you can also schedule an appointment with your provider.    We know you have a choice in where you receive your healthcare and we sincerely thank you for trusting [facility] with your health.

## 2014-11-24 ENCOUNTER — Encounter (INDEPENDENT_AMBULATORY_CARE_PROVIDER_SITE_OTHER): Payer: Self-pay | Admitting: Family

## 2014-11-24 ENCOUNTER — Telehealth (INDEPENDENT_AMBULATORY_CARE_PROVIDER_SITE_OTHER): Payer: Self-pay | Admitting: Family

## 2014-11-24 NOTE — Telephone Encounter (Signed)
PA for Ondansetron approved via Navinet:    Your request has been approved   CaseId:[id number];Product Name:QD (PLA) Antiemetic Agents (Anzemet, Emend, Kytril, Zofran, Zuplenz, Granisol, aprepitant, ondansetron, granisetron, Sancuso);Status:Approved;Coverage Start Date:10/25/2014;Coverage End Date:11/24/2015;    Pharmacy notified

## 2014-11-24 NOTE — Telephone Encounter (Signed)
Prior Authorization for: Ondansetron   Dosage Amount: 8 MG     PA Pharmacy Name: [facility]     [place] Pharmacy Phone #: [phone]    Requesting Pharmacy Name: [facility]/[phone]    Insurance ID: [id number]    Case # (if applicable): None     Gave form to Doctor (Yes/No) No-Received and submitted via Navinet     Fonnie MuKerry Meyer ARNP     Postponed to 11/28/14 for follow up

## 2014-11-24 NOTE — Telephone Encounter (Signed)
Faxed addended copy from 11/23/2014 to [facility].

## 2014-11-25 NOTE — Progress Notes (Signed)
Chief complaint is follow-up for benign positional vertigo.    Patient is now on his second week of experiencing benign positional vertigo.  Symptoms are 80% improved.  He still has some dizziness when he turns his head certain ways.  He is now able to drive and has returned to all of his activities of daily living.  Vomiting and nausea ceased.  No headache.  He is ready to return to work on Monday.  Physical therapy has been very helpful.      Blood pressure is elevated today.  Patient states he has been taking his medication for his pain which does include meloxicam.  He is currently not on a blood pressure medication.  Denies chest pain or shortness of breath.  CT scan of the head was negative.  Believes his thyroid medication is working and has not had symptoms of low thyroid or elevated thyroid.  Has not lost weight.  Is not eating a healthy diet.  He does take several supplements.    Review of systems  No night sweats now.  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  His balance is nearly normal.  Negative Romberg.  Still can't stand on one leg but is able to stand erect with his eyes closed.  Very mild nystagmus in the left eye with sudden turn of his head.  Neck is supple with full range of motion  Heart rate is regular rate and rhythm.  Second reading of blood pressure was improved.  Girth is large and BMI is elevated at 37  No edema in lower extremities    Assessment    Resolving vestibular dysfunction  Anxiety and depression due to chronic pain and ongoing medical concerns  Hypertension  Obesity    PLAN    (R42) Vertigo  (primary encounter diagnosis)  Plan: Meclizine HCl 25 MG Oral Tab  Refilled and discussed continued physical therapy    (F41.9,  F32.9) Anxiety and depression  Plan: Referral to Care Management  Patient will be seen by our behavioral health psychiatrist for  further evaluation    (G43.A0) Cyclical vomiting with nausea, unspecified intactability  Plan: Ondansetron 8 MG Oral TABLET DISPERSIBLE  Refill    (I10) Essential hypertension  Start a heart healthy diet and exercise 5 times a week as we've discussed  I suggested he consider small frequent meals and look at either the healthy 30 program or   Haliee Pomroy's nutrition plan.    Completed paper work for [employer] and provided to patient

## 2014-12-01 ENCOUNTER — Telehealth (HOSPITAL_BASED_OUTPATIENT_CLINIC_OR_DEPARTMENT_OTHER): Payer: Self-pay

## 2014-12-01 NOTE — Telephone Encounter (Signed)
Routing to pss

## 2014-12-01 NOTE — Telephone Encounter (Addendum)
CONFIRMED PHONE NUMBER: [phone]  CALLERS FIRST AND LAST NAME: Daphine Deutscher  FACILITY NAME: n/a TITLE: n/a  CALLERS RELATIONSHIP:Self  RETURN CALL: General message OK, details on VM okay (patient requested a direct number if possible)     SUBJECT: Appointment Request   REASON FOR REQUEST: see referral    REQUEST APPOINTMENT WITH: [facility]  SYMPTOMS: see referral  REFERRING PROVIDER: see referral  REQUESTED DATE: please discuss with patient  REQUESTED TIME: n/a  UNABLE TO APPOINT: Other: Referral instructions state to schedule visit type 'carotid', but there are two options for this visit type: artery duplex, and intra op. Please update referral instructions and indicate which of these should be scheduled, and assist patient with scheduling appointment. Thank you

## 2014-12-02 NOTE — Telephone Encounter (Signed)
Patient following up on request.

## 2014-12-06 NOTE — Telephone Encounter (Signed)
Routing to pss

## 2014-12-08 NOTE — Telephone Encounter (Signed)
Called and left a detail message for the patient to call back to schedule

## 2014-12-09 ENCOUNTER — Telehealth (INDEPENDENT_AMBULATORY_CARE_PROVIDER_SITE_OTHER): Payer: BLUE CROSS/BLUE SHIELD | Admitting: Clinical

## 2014-12-09 NOTE — Telephone Encounter (Signed)
SW PSYCHIATRY SCREENING TOOL    CURRENT MENTAL HEALTH CONCERNS/REASON(S) FOR VISIT     Patient is a 49 year old English speaking male referred by Eliane Decree, ARNP to the psychiatry consultation for Depression, Anxiety.    History of Present Illness:   Pt has [facility], interested in ongoing psychiatry and counseling resources - SW will send eCare message.    Pt denies SI, "never."    Pt reports depression and anxiety symptoms since childhood, "[place], very [place]... If I put the fear of something in you... Get you to do better."  Pt interested in diagnostic clarification around bipolar symptoms noticed by pt's family since about last summer, "extreme mood swings... Moodiness, elated super happy, loud, feeling hopeless, tired all the time, edgy, can't sleep, impulse control problems, racing thoughts, uncontrolled thoughts, never feel good enough."  Pt staying in spare room at home, wife does not want him in her room, "until you start getting help, until we start seeing changes with addiction and impulses, moodiness and mood swings... Happy go lucky one moment" then it's like somebody flipped the switch.    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 Sexual thoughts race through my head," causes irritability.  Pt reports he's in a 12 step program, "how to control myself and not spread it to my kids," looking for a community program for sexual disorders.    Sleep - CPAP machine started 4-5 years ago with Dr. Virgina Jock, need appt for eval.  "I could sleep all day if I could, other times I don't get tired, I could lay in bed anyways... don't know if it's a sleep disorder, or if bed is comfort zone."  Pt reports sleeping  4-12 hours per day.      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 No    PSYCHIATRIC HX     HOSPITALIZATION(S):  None  OUTPATIENT TREATMENT: 12 Step program    PAST DIAGNOSES: Anxiety and Depression,   SUICIDE ATTEMPTS: None  SAFETY CONCERNS (risk/protective factors):  none  FIREARMS:  YES:, unloaded, safety locked, ammunition on one side and gun on the other.  No HI/SI.     CURRENT/PAST MENTAL HEALTH MEDICATIONS:   Zoloft (in past) - "thought it was ok, family members didn't think it was that good, not a big change"  Now taking Cymbalta - "Doesn't seem to be working," started year ago     SUBSTANCE USE/ABUSE      Alcohol: Denies, 3-4 weeks ago last time, 1 beer monthly  Marijuana: Denies, "can't do it because of work" (random testing at [employer])  Other: Denies  Tobacco: Denies  Caffeine: A lot, 6-12 cups of coffee a day  Treatment History:  Denies      SOCIAL/FAMILY HISTORY     SIGNIFICANT EVENTS/TRAUMA HX:  None  FAMILY HX  OF PSYCH: Unsure, this was never brought up in my family  FAMLY HX OF SUBSTANCE ABUSE:  Unsure  OTHER: Daughter diagnosed with bipolar last year, on many meds (pt don't know names)      PATIENT'S GOAL(S) FOR TREATMENT:  Diagnostic clarification - bipolar  Treatment recommendation - sexual addiction  Scheduled with Dr. Marianne Sofia for Friday, July 1 @ 10am.      Pt interested in counseling, esp around sex addiction.  SW sent eCare message with resources, will call in 2 weeks to f/u.

## 2014-12-14 NOTE — Telephone Encounter (Signed)
12/14/14,  PA received again from [facility]. I was confused because we already had an approval.   Per [facility], original claim went through on 11/24/14 because pharmacy only ran as 15.   Per [facility],  Approval of 45 for 25 days and claim went through.       Notified [employer].      If new PA comes in, it may be the way that the pharmacy is running the Quantity.   60 for 20 days should work.

## 2014-12-23 ENCOUNTER — Ambulatory Visit (INDEPENDENT_AMBULATORY_CARE_PROVIDER_SITE_OTHER): Payer: PRIVATE HEALTH INSURANCE | Admitting: Psychiatry

## 2014-12-23 DIAGNOSIS — F340 Cyclothymic disorder: Secondary | ICD-10-CM

## 2014-12-23 NOTE — Progress Notes (Signed)
Psychiatric Consultation Initial Evaluation    CHIEF COMPLAINT:   [name] is a 49 year old Caucasian male presenting for psychiatric diagnostic clarification.  Patient was referred by Nelva Bush for further evaluation.    HISTORY OF PRESENT ILLNESS:   - Patient reported that he has"bipolar disorder " for past few months so he came for diagnostic clarification. The family has been telling him that he has been very moody. Also reported that he also has porn addiction and wants to get help. This has resulted in relationship problems with wife, both sleeping in separate rooms. Interested in counseling or support resources.    - Currently on Duloxetine which was given by his neurologist to target his pain secondary to AS.Does not feel duloxetine is helping with his mood at all. Has tried sertraline in past which did not help as well.      - When asked about ongoing stresses he reported that most of his marriage life he has been living in different states secondary to work, away from family and visited them only once in 6 months. He was laid off that job around 6 yrs ago  and now started working at [employer]. For past 6 yrs the whole family pt,his wife, son and daughter have been living together. This has been a big adjustment for him as was used to living alone. Other stressors include his daughter was recently diagnosed with bipolar disorder,son has Asperger syndrome, wife has problems with eating disorder and finances are tight.  Reported that he has stress from all sides and does not know whom to turn to. Was in counseling but did not connect well with his counselor so stopped going.    - He reported that he has depression for years, started seeing a counselor in late 1990s when his depression was worse and was having "negative thoughts but cannot remember details". He did very well but had to stop after he lost his job because of insurance. Describes depression symptoms, feeling that he is not good enough, mood  depressed, isolating self, anhedonia, low energy, low motivation. He has days when he feels low but contributes that to the ongoing stresses.    - Did report feeling very moody and going from feeling good to angry or depressed. He feels the symptoms last from couple of hrs to few hours. Denies SI/HI. Denies symptoms consistent with psychosis. He remembers havinfg times in college when he did not sleep much, high energy, doing impulsive things but nothing severe which got him in trouble. Reported that after being brought up in very strict catholic home going to college was a a lot of excitement and indulged in things which he could not do at home, had a "party time".     - Pt reported that his pornography addiction has been going on for years but now it is interfering in his relationships so wants to seek help.    Called pt's wife,Anthony Andrade per pt's request, [phone]  Wife reported that pt has been moody most of his life. He goes from being very happy, talking loud and more, high energy than usual, disinhibited behaviors lasting from few hours to couple of days to feeling low, depressed, isolating self, no drive to get out of bed, irritable which can also last for days. They have a daughter with bipolar disorder and  Anthony Andrade feels husband has similar presentation but just shorter periods. She also noticed pt's increased sex addiction/pornography and at times disinhibited with her at night as  a result she sleeps in a different room.      PAST PSYCHIATRIC HISTORY:  Hospitalization: Denies  Outpatient treatment: 12 step program for sex addiction    Past Diagnoses: anxiety and depression  Suicide Attempts: denies  Past Medications:   Zoloft (in past) - "thought it was ok, family members didn't think it was that good, not a big change"  Now taking Cymbalta - "Doesn't seem to be working," started year ago      SUBSTANCE HISTORY:  EtOH:  Last use was more than a month ago  Drugs:  denies  Tobacco:  denies    MEDICAL  HISTORY:  Active Ambulatory Problems     Diagnosis Date Noted   . Depressive disorder, not elsewhere classified 12/16/2008   . Allergic rhinitis, cause unspecified 01/19/2009   . Ankylosing spondylitis ([place]) 06/19/2009   . Chronic pain 02/16/2012   . Hypertension 02/16/2012   . Hypothyroidism 09/11/2012   . Foot pain 04/21/2013   . Other acquired absence of organ 08/25/2009   . Other postprocedural status(V45.89) 08/25/2009   . Unspecified sleep apnea 08/20/2010   . Asthma 07/11/2013   . Cough 08/20/2013   . Allergic rhinitis due to allergen 04/18/2011   . Restless legs syndrome 11/14/2010   . 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 Sleep apnea 08/20/2010   . Acute vestibular neuritis 11/16/2014     Resolved Ambulatory Problems     Diagnosis Date Noted   . No Resolved Ambulatory Problems     Past Medical History   Diagnosis Date   . Esophageal reflux    . Allergic rhinitis due to other allergen    . Restless legs syndrome (RLS)         REVIEW OF SYSTEMS:   Per HPI,    FAMILY PSYCHIATRIC HISTORY:  Daugher: diagnosed with bipolar and on multiple medications  Pat Grandmother: institutionalized and got ECTs  Mat Grandmother: health anxiety  Sister: goes for counseling but does not know the reason      SOCIAL HISTORY: [ .soc   .sochx   .socdoc]  -  Married and lives with wife and children. Daughter 8 yr and son is 21 yrs. Works at [employer].    MEDICATIONS: [.cmed   .cmedp   .cmeds]  Current Outpatient Prescriptions   Medication Sig Dispense Refill   . Acetaminophen-Codeine 300-15 MG Oral Tab One or two tablets every 6 hours as needed. 56 tablet 0   . Albuterol Sulfate (2.5 MG/3ML) 0.083% Inhalation Nebu Soln Inhale 3 mL (2.5 mg) via nebulizer every 6 hours as needed for shortness of breath/wheezing. 100 vial 1   . Albuterol Sulfate HFA (PROAIR HFA) 108 (90 BASE) MCG/ACT Inhalation Aero Soln Inhale 2 puffs by mouth every 4 hours as needed (asthma). For Asthma. May use every 2 hours for attacks, or pereventively before  exercise. 1 Inhaler 6   . Budesonide 0.5 MG/2ML Inhalation Suspension Inhale 2 mL (0.5 mg) via nebulizer every 12 hours. 120 mL 1   . Certolizumab Pegol (CIMZIA PREFILLED) 2 X 200 MG/ML Subcutaneous Kit Inject 200 mg under the skin every 2 weeks.     . Clotrimazole-Betamethasone 1-0.05 % External Cream Apply 1 application topically 2 times a day as needed (rash). Apply to buttocks 45 g 2   . DULoxetine HCl 60 MG Oral CAPSULE ENTERIC COATED PARTICLES   0   . Esomeprazole Magnesium 40 MG Oral CAPSULE DELAYED RELEASE One tablet twice per day on empty stomach 180 capsule  1   . Fexofenadine-Pseudoephed ER 60-120 MG Oral TABLET SR 12 HR Take 1 tablet by mouth every 12 hours as needed for allergies. 30 tablet 1   . Fluticasone Propionate 50 MCG/ACT Nasal Suspension Spray 2 sprays into each nostril 2 times a day. 1 bottle 3   . Formoterol Fumarate 20 MCG/2ML Inhalation Nebu Soln Inhale 2 mL via nebulizer 2 times a day. 60 mL 1   . Ipratropium-Albuterol 20-100 MCG/ACT Inhalation Aero Soln Inhale 2 puffs by mouth every 4 hours as needed (cough). 1 Inhaler 11   . Levothyroxine Sodium 88 MCG Oral Tab One tab on empty stomach daily 90 tablet 1   . Losartan Potassium 50 MG Oral Tab One tablet daily by mouth 90 tablet 3   . Meclizine HCl 25 MG Oral Tab Take 2 tablets (50 mg) by mouth 2 times a day as needed for dizziness or nausea/vomiting. 60 tablet 0   . Meloxicam 15 MG Oral Tab 1 tablet (15 mg) daily. 90 tablet 0   . Montelukast Sodium 10 MG Oral Tab Take 1 tablet (10 mg) by mouth every evening. 90 tablet 3   . Olopatadine HCl 0.2 % Ophthalmic Solution Place 1 drop in each EYE daily. 2.5 mL 2   . Olopatadine HCl 0.6 % Nasal Solution Spray 2 sprays into each nostril 2 times a day. 1 bottle 11   . Ondansetron 8 MG Oral TABLET DISPERSIBLE Dissolve 1 tablet (8 mg) on top of tongue and swallow every 8 hours as needed for nausea/vomiting. 60 tablet 1   . Pregabalin (LYRICA) 75 MG Oral Cap Three tablets at dinner and three tablets at  bedtime 180 capsule 0   . Respiratory Therapy Supplies (NEBULIZER) Does not apply Device Use every 6 hours as needed for other (chronic bronchitis). Chronic bronchitis 1 Device 0   . Spacer/Aero-Holding Chambers (AEROCHAMBER Z-STAT PLUS) Misc Use with inhaler as directed. 1 Device 1     No current facility-administered medications for this visit.       ALLERGIES: Morphine and Penicillins      MENTAL STATUS EXAMINATION:  General appearance: overweight caucasian gentleman dressed in jeans and tshirt with fair hygiene and grooming  Behavior/activity: cooperative and forthcoming with information  Speech: coherent   Mood: depressed and tired  Affect: congruent with mood  Thought content: preoccupied with his symptoms, denies perception disturbance, denies SI/HI  Thought form: was a bit disorganized, tangential but redirectable  Orientation: intact  Attention/concentration: fair  Memory: grossly intact  Insight and judgment: uncertain      LABORATORY DATA:   THYROID STIMULATING HORMONE   Date Value Ref Range Status   06/25/2014 3.425 0.400 - 5.000 uIU/mL Final     No results found for this or any previous visit.  Results for orders placed or performed in visit on 05/26/09   CBC (HEMOGRAM)   Result Value Ref Range    WBC 7.96 4.3 - 10.0 THOU/uL    RBC 4.89 4.40 - 5.60 mil/uL    Hemoglobin 14.1 13.0 - 18.0 g/dL    Hematocrit 41 38 - 50 %    MCV 85 81 - 98 fL    MCH 28.8 27.3 - 33.6 pg    MCHC 34.1 32.2 - 36.5 g/dL    Platelet Count 230 150 - 400 THOU/uL    RDW-CV 13.5 11.6 - 14.4 %     No results found for this or any previous visit.  Results for orders placed or performed in  visit on 07/15/13   VITAMIN D (25 HYDROXY)   Result Value Ref Range    Vitamin D2 (25_Hydroxy) 1.1 ng/mL    Vitamin D3 (25_Hydroxy) 24.8 ng/mL    Vit D (25_Hydroxy) Total 25.9 20.1 - 50.0 ng/mL    Vit D (25_Hydroxy) Interp Normal:            20.1-50.0 ng/mL        ASSESSMENT:   [name] is a 49 year old Caucasian gentleman with presentation consistent  with cyclothymic disorder. During the appointment discussed with pt about t/t of depression but after getting collateral information from his wife it seems his presentation is more consistent with cyclothymic disorder instead of depression alone.    Discussed with pt about Venlafaxine in the appointment before I gathered collateral from his wife so will send him ecare message to pt about change in diagnosis after collateral information and also different med recommendation.     DIAGNOSES:  Primary Diagnosis: Cyclothymic disorder  Secondary Diagnosis: h/o depressive disorder, Sex addiction      TREATMENT RECOMMENDATIONS AND PLAN:  For Primary Care Provider:   Discussed with pt about venlafaxine but given information from wife which I got after the appointment it seems pt's presentation is consistent with cyclothymic disorder instead of depression.   Recommend to consider starting lamotrigine to target mood instability, Dosing starting with 25 mg for 2 weeks and increase every 2 weeks by 25 mg upto dose of 200 mg   Since I got collateral from pt's wife after pt had left I was not able to discuss details about lamotrigine and possible side effects. Please see below for more details.   Defer to PCP for prescribing    For SW/Care Coordinator:   Please assist pt with resources for sex/pornograhy addiction, pt is looking for counseling plus groups    PATIENT GUARDIAN COLLABORATION WITH PROVIDER:    Treatment plan discussed with Patient  Time spent by provider face to face with the pt: 60 mins. Of that time > 50% was spent counseling the patient.     LAMOTRIGINE (LAMICTAL)   DOSING INFORMATION:   Typical Target Dosage:  200 mg Qday; No evidence of increased mood stabilization benefit at higher doses.   Restarting therapy after discontinuation: If lamotrigine has been withheld for 3 days, restart according to initial dosing recommendations.  Non-urgent discontinuation: Decrease by 50% per week.     FDA Indications:   Bipolar Disorder, maintenance.  Off-Label Indications: Bipolar, depression.    Side effects: Common: Dizziness (31%), headache (29%), double vision (24%), nausea (18%), somnolence (14%), blurred vision (11%), unsteadiness/ataxia (10%).     Black Box Warning:  (1) For serious, life-threatening rashes requiring hospitalization and discontinuation of treatment Kathreen Cosier syndrome @ approx. 1: 1000 to 2000).  The risk of rash may also be increased by co-administration of lamotrigine with Depakote (valproic acid) exceeding the recommended initial dose of lamotrigine, or exceeding the recommended dose escalation for lamotrigine.  Nearly all cases of life-threatening rashes associated with lamotrigine have occurred within 2 to 8 weeks of treatment initiation.  Lamotrigine should ordinarily be discontinued at the first sign of rash, unless the rash is clearly not drug related.    Significant drug-drug interactions: Notable interactions include: estrogen containing oral contraceptive increase metabolism, carbamazepine, phenytoin, phenobarbital, primidone, or valproate.

## 2014-12-23 NOTE — Patient Instructions (Addendum)
1. As discussed with you recommend to consider venlafaxine XR to target depression with low energy, low motivation,low mood  2. Will defer to your Neurologist and PCP to cross titrate you from duloxetine to venlafaxine XR  3. Ernest HaberGrace Bechle, clinic social worker wiill help you with resources and support groups for sex addiction  4. As discussed no need to follow up with Dr Marianne Sofiaoor but she is available if something comes up  5. Please schedule follow up with Fonnie MuKerry Meyer next week  to discuss the recommendations.

## 2014-12-24 ENCOUNTER — Encounter (INDEPENDENT_AMBULATORY_CARE_PROVIDER_SITE_OTHER): Payer: Self-pay | Admitting: Psychiatry

## 2014-12-27 ENCOUNTER — Other Ambulatory Visit (INDEPENDENT_AMBULATORY_CARE_PROVIDER_SITE_OTHER): Payer: Self-pay | Admitting: Clinical

## 2014-12-27 NOTE — Progress Notes (Signed)
SW left voicemail and sent eCare message to f/u on counseling referrals on 6/17.

## 2014-12-29 ENCOUNTER — Other Ambulatory Visit (INDEPENDENT_AMBULATORY_CARE_PROVIDER_SITE_OTHER): Payer: Self-pay | Admitting: Clinical

## 2014-12-29 NOTE — Progress Notes (Signed)
SW left voicemail and sent eCare message to f/u on counseling and med recommendations from appt with Dr. Marianne Sofiaoor 7/1.

## 2015-01-11 ENCOUNTER — Ambulatory Visit (INDEPENDENT_AMBULATORY_CARE_PROVIDER_SITE_OTHER): Payer: Self-pay | Admitting: Family

## 2015-01-13 ENCOUNTER — Other Ambulatory Visit (INDEPENDENT_AMBULATORY_CARE_PROVIDER_SITE_OTHER): Payer: Self-pay | Admitting: Family

## 2015-01-13 DIAGNOSIS — K2100 Gastro-esophageal reflux disease with esophagitis, without bleeding: Secondary | ICD-10-CM

## 2015-01-14 MED ORDER — ESOMEPRAZOLE MAGNESIUM 40 MG OR CPDR
DELAYED_RELEASE_CAPSULE | ORAL | Status: DC
Start: 2015-01-14 — End: 2015-04-04

## 2015-01-14 NOTE — Telephone Encounter (Signed)
Patient last seen on 11/23/14  Patient last seen on 06/25/14 for GERD

## 2015-02-01 ENCOUNTER — Ambulatory Visit (INDEPENDENT_AMBULATORY_CARE_PROVIDER_SITE_OTHER): Payer: BLUE CROSS/BLUE SHIELD | Admitting: Family

## 2015-02-13 ENCOUNTER — Other Ambulatory Visit (INDEPENDENT_AMBULATORY_CARE_PROVIDER_SITE_OTHER): Payer: Self-pay | Admitting: Family

## 2015-02-13 DIAGNOSIS — E039 Hypothyroidism, unspecified: Secondary | ICD-10-CM

## 2015-02-14 MED ORDER — LEVOTHYROXINE SODIUM 88 MCG OR TABS
ORAL_TABLET | ORAL | Status: DC
Start: 2015-02-14 — End: 2015-04-04

## 2015-02-15 ENCOUNTER — Encounter (INDEPENDENT_AMBULATORY_CARE_PROVIDER_SITE_OTHER): Payer: Self-pay

## 2015-02-20 ENCOUNTER — Ambulatory Visit (INDEPENDENT_AMBULATORY_CARE_PROVIDER_SITE_OTHER): Payer: Self-pay | Admitting: Family

## 2015-02-22 ENCOUNTER — Encounter (INDEPENDENT_AMBULATORY_CARE_PROVIDER_SITE_OTHER): Payer: Self-pay | Admitting: Family

## 2015-02-22 NOTE — Progress Notes (Signed)
Anthony Andrade did not cancel and was not present for a scheduled appointment today.  Disposition: No show today

## 2015-02-28 ENCOUNTER — Ambulatory Visit (INDEPENDENT_AMBULATORY_CARE_PROVIDER_SITE_OTHER): Payer: Self-pay | Admitting: Family

## 2015-04-04 ENCOUNTER — Encounter (INDEPENDENT_AMBULATORY_CARE_PROVIDER_SITE_OTHER): Payer: Self-pay | Admitting: Family

## 2015-04-04 ENCOUNTER — Ambulatory Visit (INDEPENDENT_AMBULATORY_CARE_PROVIDER_SITE_OTHER): Payer: BLUE CROSS/BLUE SHIELD | Admitting: Family

## 2015-04-04 VITALS — BP 148/88 | HR 89 | Temp 97.9°F | Resp 16 | Wt 261.2 lb

## 2015-04-04 DIAGNOSIS — I10 Essential (primary) hypertension: Secondary | ICD-10-CM

## 2015-04-04 DIAGNOSIS — K2100 Gastro-esophageal reflux disease with esophagitis, without bleeding: Secondary | ICD-10-CM

## 2015-04-04 DIAGNOSIS — E785 Hyperlipidemia, unspecified: Secondary | ICD-10-CM

## 2015-04-04 DIAGNOSIS — F340 Cyclothymic disorder: Secondary | ICD-10-CM

## 2015-04-04 DIAGNOSIS — G8929 Other chronic pain: Secondary | ICD-10-CM

## 2015-04-04 DIAGNOSIS — R21 Rash and other nonspecific skin eruption: Secondary | ICD-10-CM

## 2015-04-04 DIAGNOSIS — G2581 Restless legs syndrome: Secondary | ICD-10-CM

## 2015-04-04 DIAGNOSIS — E039 Hypothyroidism, unspecified: Secondary | ICD-10-CM

## 2015-04-04 LAB — LIPID PANEL
Cholesterol/HDL Ratio: 7.9
HDL Cholesterol: 25 mg/dL — ABNORMAL LOW (ref >39)
Non-HDL Cholesterol: 172 mg/dL — ABNORMAL HIGH (ref 0–159)
Total Cholesterol: 197 mg/dL (ref <200)
Triglyceride: 1083 mg/dL — ABNORMAL HIGH (ref <150)

## 2015-04-04 LAB — THYROID STIMULATING HORMONE: Thyroid Stimulating Hormone: 2.514 u[IU]/mL (ref 0.400–5.000)

## 2015-04-04 MED ORDER — LAMOTRIGINE 25 MG OR TABS
ORAL_TABLET | ORAL | Status: DC
Start: 2015-04-04 — End: 2015-09-22

## 2015-04-04 MED ORDER — LEVOTHYROXINE SODIUM 88 MCG OR TABS
88.0000 ug | ORAL_TABLET | Freq: Every day | ORAL | Status: DC
Start: 2015-04-04 — End: 2016-01-28

## 2015-04-04 MED ORDER — LOSARTAN POTASSIUM 50 MG OR TABS
ORAL_TABLET | ORAL | Status: DC
Start: 2015-04-04 — End: 2015-06-14

## 2015-04-04 MED ORDER — MELOXICAM 15 MG OR TABS
15.0000 mg | ORAL_TABLET | Freq: Every day | ORAL | Status: DC
Start: 2015-04-04 — End: 2023-06-03

## 2015-04-04 MED ORDER — PREGABALIN 75 MG OR CAPS
ORAL_CAPSULE | ORAL | Status: DC
Start: 2015-04-04 — End: 2015-08-09

## 2015-04-04 MED ORDER — ESOMEPRAZOLE MAGNESIUM 40 MG OR CPDR
40.0000 mg | DELAYED_RELEASE_CAPSULE | Freq: Two times a day (BID) | ORAL | Status: DC
Start: 2015-04-04 — End: 2015-10-18

## 2015-04-04 MED ORDER — CLOTRIMAZOLE-BETAMETHASONE 1-0.05 % EX CREA
1.0000 | TOPICAL_CREAM | Freq: Two times a day (BID) | CUTANEOUS | Status: DC | PRN
Start: 2015-04-04 — End: 2017-08-25

## 2015-04-04 MED ORDER — DULOXETINE HCL 60 MG OR CPEP
60.0000 mg | DELAYED_RELEASE_CAPSULE | Freq: Every day | ORAL | Status: DC
Start: 2015-04-04 — End: 2016-04-04

## 2015-04-04 MED ORDER — ROPINIROLE HCL 0.25 MG OR TABS
ORAL_TABLET | ORAL | Status: DC
Start: 2015-04-04 — End: 2017-08-25

## 2015-04-04 MED ORDER — LAMOTRIGINE 100 MG OR TABS
ORAL_TABLET | ORAL | Status: DC
Start: 2015-04-04 — End: 2015-09-22

## 2015-04-04 NOTE — Progress Notes (Signed)
Subjective:  Anthony Andrade is a 49 year old Male here with chief complaint(s):     1) Follow up for Dr Clifton Custard visit.  He is here for Depression medication management. He did not receive guidance as to whether or not to stop the Cymbalta. He is not certain what to do at this point.   He is willing to start the suggested medication and states he is ready to make changes. Admits to ongoing moderate depression and mild anxiety.     2) Concerned about heart and his elevated cholesterol levels. He struggles with weight and intake of carbs. He saw MD at [facility] who suggested he needed further treatment. He has not been successful at loosing weight. Using Cpap, exercises at work but not otherwise. Eats high carb meals. Fatigue is an issue for him.  3) Refill medication for thyroid dysfunction.  Fatigue is his primary symptoms.   4) Restless legs - he would like to try a different medication for his restless legs. He has no had success with current formula. Willing to also add magnesium. Starts later in the evening. Has been going on for years. Occasionally will wake him. Cpap helps.    Past Medical History   Diagnosis Date   . Ankylosing spondylitis ([place])    . Esophageal reflux    . Allergic rhinitis due to other allergen    . Unspecified sleep apnea    . Restless legs syndrome (RLS)      Past Surgical History   Procedure Laterality Date   . Cholecystectomy     . Unlisted procedure shoulder       left shoulder   . Esophagogastroduodenoscopy transoral diagnostic  2008     on prevacid   . Colonoscopy stoma dx including collj spec spx  2008     normal per patient     family history is not on file.  Past Surgical History   Procedure Laterality Date   . Cholecystectomy     . Unlisted procedure shoulder       left shoulder   . Esophagogastroduodenoscopy transoral diagnostic  2008     on prevacid   . Colonoscopy stoma dx including collj spec spx  2008     normal per patient       Patient Active Problem List   Diagnosis    . Depressive disorder, not elsewhere classified   . Allergic rhinitis, cause unspecified   . Ankylosing spondylitis ([place])   . Chronic pain   . Hypertension   . Hypothyroidism   . Foot pain   . Other acquired absence of organ   . Other postprocedural status(V45.89)   . Unspecified sleep apnea   . Asthma   . Cough   . Allergic rhinitis due to allergen   . Restless legs syndrome   . Anterior corneal dystrophy   . Sleep apnea   . Acute vestibular neuritis     Current Outpatient Prescriptions   Medication Sig Dispense Refill   . Acetaminophen-Codeine 300-15 MG Oral Tab One or two tablets every 6 hours as needed. 56 tablet 0   . Albuterol Sulfate (2.5 MG/3ML) 0.083% Inhalation Nebu Soln Inhale 3 mL (2.5 mg) via nebulizer every 6 hours as needed for shortness of breath/wheezing. 100 vial 1   . Albuterol Sulfate HFA (PROAIR HFA) 108 (90 BASE) MCG/ACT Inhalation Aero Soln Inhale 2 puffs by mouth every 4 hours as needed (asthma). For Asthma. May use every 2 hours for attacks, or  pereventively before exercise. 1 Inhaler 6   . Budesonide 0.5 MG/2ML Inhalation Suspension Inhale 2 mL (0.5 mg) via nebulizer every 12 hours. 120 mL 1   . Certolizumab Pegol (CIMZIA PREFILLED) 2 X 200 MG/ML Subcutaneous Kit Inject 200 mg under the skin every 2 weeks.     . Clotrimazole-Betamethasone 1-0.05 % External Cream Apply 1 application topically 2 times a day as needed (rash). Apply to buttocks 45 g 2   . DULoxetine HCl 60 MG Oral CAPSULE ENTERIC COATED PARTICLES   0   . Esomeprazole Magnesium 40 MG Oral CAPSULE DELAYED RELEASE take 1 capsule by mouth twice a day ON EMPTY STOMACH 180 capsule 1   . Fexofenadine-Pseudoephed ER 60-120 MG Oral TABLET SR 12 HR Take 1 tablet by mouth every 12 hours as needed for allergies. 30 tablet 1   . Fluticasone Propionate 50 MCG/ACT Nasal Suspension Spray 2 sprays into each nostril 2 times a day. 1 bottle 3   . Formoterol Fumarate 20 MCG/2ML Inhalation Nebu Soln Inhale 2 mL via nebulizer 2 times a day. 60 mL  1   . Ipratropium-Albuterol 20-100 MCG/ACT Inhalation Aero Soln Inhale 2 puffs by mouth every 4 hours as needed (cough). 1 Inhaler 11   . Levothyroxine Sodium 88 MCG Oral Tab take 1 tablet by mouth once daily ON AN EMPTY STOMACH 90 tablet 1   . Losartan Potassium 50 MG Oral Tab One tablet daily by mouth 90 tablet 3   . Meclizine HCl 25 MG Oral Tab Take 2 tablets (50 mg) by mouth 2 times a day as needed for dizziness or nausea/vomiting. 60 tablet 0   . Meloxicam 15 MG Oral Tab 1 tablet (15 mg) daily. 90 tablet 0   . Montelukast Sodium 10 MG Oral Tab Take 1 tablet (10 mg) by mouth every evening. 90 tablet 3   . Olopatadine HCl 0.2 % Ophthalmic Solution Place 1 drop in each EYE daily. 2.5 mL 2   . Olopatadine HCl 0.6 % Nasal Solution Spray 2 sprays into each nostril 2 times a day. 1 bottle 11   . Ondansetron 8 MG Oral TABLET DISPERSIBLE Dissolve 1 tablet (8 mg) on top of tongue and swallow every 8 hours as needed for nausea/vomiting. 60 tablet 1   . Pregabalin (LYRICA) 75 MG Oral Cap Three tablets at dinner and three tablets at bedtime 180 capsule 0   . Respiratory Therapy Supplies (NEBULIZER) Does not apply Device Use every 6 hours as needed for other (chronic bronchitis). Chronic bronchitis 1 Device 0   . Spacer/Aero-Holding Chambers (AEROCHAMBER Z-STAT PLUS) Misc Use with inhaler as directed. 1 Device 1     No current facility-administered medications for this visit.     Social History     Social History   . Marital Status: Married     Spouse Name: N/A   . Number of Children: N/A   . Years of Education: N/A     Occupational History   . Not on file.     Social History Main Topics   . Smoking status: Former Research scientist (life sciences)   . Smokeless tobacco: Never Used   . Alcohol Use: No   . Drug Use: No   . Sexual Activity: Not on file     Other Topics Concern   . Not on file     Social History Narrative    Recently moved from [place].  Works for [employer].  One daughter.  Sister is an Therapist, sports.  02/15/12    Lives with his wife.     Daughter is studious     Son has been diagnosed with mild asperger's syndrome.    He works at [employer] and is an Psychologist, sport and exercise and Penicillins  ROS: Review of Systems:  Constitutional: Positive for fatigue and sleep disturbance   Eyes: Negative    Ears, Nose, Mouth, Throat: Negative    Cardiovascular: Negative for chest pain , palpitations  and paroxysmal nocturnal dyspnea    Respiratory: Negative for shortness of breath     Gastrointestinal: ongoing gastric reflux and takes medication. Without it the fullness and pain is not manageable    Endocrine: Negative for weakness and does experience frequent headaches    OBJECTIVE:    Physical:   Pleasant male, somewhat anxious appearing obese. Breathing easily.   HEENT: Negative for sinus drainage  Thick neck noted  No Jugular Venous distention  Heart Regular rate S1 and S2 heart without murmur  Lungs: clear anteriorly and posteriorly  Abdomen: large girth  Skin: fine sand paper rash noted   Lower Extremities: no edema or concerning vascular change at this time        IMPRESSION:    Patient is at risk for cardiovascular disease.   First will review his labs  Start meds for elevated triglycerides  Might consider cardio stress test    Discussed considerations for weight loss and improved cardiovascular health  Orders per documented in this encounter  Patient instructions per documented in this encounter  Revisit monthly for weight evaluation      (R21) Rash  (primary encounter diagnosis)  Plan: Clotrimazole-Betamethasone 1-0.05 % External         Cream         (K21.0) Gastroesophageal reflux disease with esophagitis  Plan: Esomeprazole Magnesium 40 MG Oral CAPSULE         DELAYED RELEASE            (E03.9) Acquired hypothyroidism  Plan: Levothyroxine Sodium 88 MCG Oral Tab        Refill - mildly elevated but normal TSH at last check    (I10) Essential hypertension  Plan: Losartan Potassium 50 MG Oral Tab        refill and stable     (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 Reviewed her notes    (G89.29) Other chronic pain  Plan: Meloxicam 15 MG Oral Tab, Pregabalin (LYRICA)         75 MG Oral Cap, DULoxetine HCl 60 MG Oral         CAPSULE ENTERIC COATED PARTICLES        refill    (G25.81) Restless legs syndrome (RLS)  Plan: ROPINIRole HCl 0.25 MG Oral Tab        Trial and will titrate up as needed   (E78.5) Hyperlipidemia, unspecified hyperlipidemia type  Plan: start appropriate medication after reviewing     (E03.9) Hypothyroidism, unspecified type  Plan: LIPID PANEL, THYROID STIMULATING HORMONE        recheck

## 2015-04-04 NOTE — Patient Instructions (Signed)
Magnesium 400 mg can be helpful at night for RLS. It does help with constipation as well.

## 2015-04-04 NOTE — Progress Notes (Signed)
Reason for visit: Medication Review       Colon Screen: UTD     Have you seen a specialist since your last visit: NO      Forecast:  ISS DUE: FLU - MW 03/31/2015    PHQ2** 12/23/2014    HM Due:   Health Maintenance   Topic Date Due   . Influenza Vaccine (1) 02/23/2015   . Colon Cancer Screen w/ FOBT/FIT  07/01/2015   . Diabetes Screen  11/13/2017   . Cholesterol Test  06/26/2019   . Tetanus Vaccine  06/25/2024   . Hepatitis C Screen  Addressed   . HIV Screen  Addressed              No future appointments.      Patient declines flu shot.

## 2015-04-05 NOTE — Progress Notes (Signed)
Quick Note:      Your lab results is normal.   Please let me know if you have any question.    ______

## 2015-04-06 ENCOUNTER — Telehealth (INDEPENDENT_AMBULATORY_CARE_PROVIDER_SITE_OTHER): Payer: Self-pay | Admitting: Family

## 2015-04-06 DIAGNOSIS — E781 Pure hyperglyceridemia: Secondary | ICD-10-CM | POA: Insufficient documentation

## 2015-04-06 DIAGNOSIS — R1013 Epigastric pain: Secondary | ICD-10-CM

## 2015-04-06 DIAGNOSIS — R748 Abnormal levels of other serum enzymes: Secondary | ICD-10-CM

## 2015-04-06 HISTORY — DX: Pure hyperglyceridemia: E78.1

## 2015-04-06 NOTE — Telephone Encounter (Signed)
Patient was not reachable so I spoke to his wife.    Ask him to follow up for blood test before starting statin and fibrates for severe elevation of triglycerides.    I will f/u by eCare or phone.

## 2015-04-10 ENCOUNTER — Other Ambulatory Visit (INDEPENDENT_AMBULATORY_CARE_PROVIDER_SITE_OTHER): Payer: BLUE CROSS/BLUE SHIELD

## 2015-04-10 DIAGNOSIS — E781 Pure hyperglyceridemia: Secondary | ICD-10-CM

## 2015-04-10 DIAGNOSIS — R748 Abnormal levels of other serum enzymes: Secondary | ICD-10-CM

## 2015-04-10 DIAGNOSIS — R1013 Epigastric pain: Secondary | ICD-10-CM

## 2015-04-10 LAB — COMPREHENSIVE METABOLIC PANEL
ALT (GPT): 79 U/L — ABNORMAL HIGH (ref 10–48)
AST (GOT): 36 U/L (ref 9–38)
Albumin: 3.9 g/dL (ref 3.5–5.2)
Alkaline Phosphatase (Total): 102 U/L (ref 39–139)
Anion Gap: 12 (ref 4–12)
Bilirubin (Total): 0.6 mg/dL (ref 0.2–1.3)
Calcium: 8.9 mg/dL (ref 8.9–10.2)
Carbon Dioxide, Total: 25 meq/L (ref 22–32)
Chloride: 102 meq/L (ref 98–108)
Creatinine: 0.86 mg/dL (ref 0.51–1.18)
GFR, Calc, African American: >60 mL/min (ref >59)
GFR, Calc, European American: >60 mL/min (ref >59)
Glucose: 116 mg/dL (ref 62–125)
Potassium: 3.6 meq/L (ref 3.6–5.2)
Protein (Total): 6.3 g/dL (ref 6.0–8.2)
Sodium: 139 meq/L (ref 135–145)
Urea Nitrogen: 16 mg/dL (ref 8–21)

## 2015-04-10 LAB — PR A1C RAPID, ONSITE: Hemoglobin A1C: 6.4 % — ABNORMAL HIGH (ref 4.0–6.0)

## 2015-04-10 LAB — AMYLASE: Amylase Total: 24 U/L — ABNORMAL LOW (ref 27–106)

## 2015-04-10 LAB — LIPASE: Lipase: 30 U/L (ref <70)

## 2015-04-20 ENCOUNTER — Encounter (INDEPENDENT_AMBULATORY_CARE_PROVIDER_SITE_OTHER): Payer: Self-pay | Admitting: Family

## 2015-04-21 ENCOUNTER — Encounter (INDEPENDENT_AMBULATORY_CARE_PROVIDER_SITE_OTHER): Payer: Self-pay

## 2015-04-22 ENCOUNTER — Ambulatory Visit (INDEPENDENT_AMBULATORY_CARE_PROVIDER_SITE_OTHER): Payer: Self-pay | Admitting: Family

## 2015-05-01 ENCOUNTER — Encounter (INDEPENDENT_AMBULATORY_CARE_PROVIDER_SITE_OTHER): Payer: BLUE CROSS/BLUE SHIELD | Admitting: Family

## 2015-05-01 NOTE — Progress Notes (Signed)
NO show    Anthony Andrade did not cancel and was not present for a scheduled appointment today.  Dispositio

## 2015-05-02 ENCOUNTER — Encounter (INDEPENDENT_AMBULATORY_CARE_PROVIDER_SITE_OTHER): Payer: Self-pay | Admitting: Family

## 2015-05-17 ENCOUNTER — Encounter (INDEPENDENT_AMBULATORY_CARE_PROVIDER_SITE_OTHER): Payer: Self-pay | Admitting: Family

## 2015-05-17 ENCOUNTER — Ambulatory Visit (INDEPENDENT_AMBULATORY_CARE_PROVIDER_SITE_OTHER): Payer: BLUE CROSS/BLUE SHIELD | Admitting: Family

## 2015-05-17 VITALS — BP 150/90 | HR 90 | Temp 98.3°F | Wt 255.0 lb

## 2015-05-17 DIAGNOSIS — E119 Type 2 diabetes mellitus without complications: Secondary | ICD-10-CM

## 2015-05-17 DIAGNOSIS — E785 Hyperlipidemia, unspecified: Secondary | ICD-10-CM

## 2015-05-17 MED ORDER — ATORVASTATIN CALCIUM 20 MG OR TABS
20.0000 mg | ORAL_TABLET | Freq: Every day | ORAL | Status: DC
Start: 2015-05-17 — End: 2015-11-23

## 2015-05-17 MED ORDER — METFORMIN HCL ER 500 MG OR TB24
500.0000 mg | EXTENDED_RELEASE_TABLET | Freq: Every day | ORAL | Status: DC
Start: 2015-05-17 — End: 2015-08-23

## 2015-05-17 NOTE — Patient Instructions (Signed)
We're Moving!!  As of January 2017 our new address will be [address] in [place].      It was a pleasure to see you in clinic today.                If you are not yet signed up for eCare, please see the below eCare section at the end of this document for how to enroll and to get your access codes.  It's easy to sign up at home today.    eCare  enrollment will allow you access to the below benefits   You can make appointments online   View test results / Lab Results   Request prescription renewals   Obtain a copy of our After Visit Summary (an electronic copy of this document)     Your Test Results:  If labs were ordered today the results are expected to be available via eCare in about 5 days. If you have an active eCare account, this is how we will notify you of your results.     If you do not have an eCare account then your test results will be mailed to you within about 14 days after your tests are completed. If your physician needs to change your care based on your results or is concerned, you should expect a phone call or eCare message from your provider.    If you have any questions about your test results please schedule an appointment with your provider, so that we may review them with you in greater detail.    **If it has been more than 2 weeks and you have not received your test results please send our office a message via eCare.    Medication Refills: If you need a prescription refilled, please contact your pharmacy 1 week before your current supply will run out to request the refill.  Contacting your pharmacy is the fastest and safest way to obtain a medication refill.  The pharmacy will notify our office.  Please note, that a minimum of 48 to 72 hours is needed to refill a medication,  Please call your pharmacy early to allow enough time to refill before you anticipate running out.  For faster medication refills, you can also schedule an appointment with your provider.    We know you have  a choice in where you receive your healthcare and we sincerely thank you for trusting [facility] with your health.

## 2015-05-17 NOTE — Progress Notes (Signed)
Subjective    Anthony Andrade is  here with chief complaint:    1) Here to discuss Prediabetes - Follow up for abnormal lab work which indicates he has diabetes and elevated triglycerides and also he has fatty liver.   Diabetes - struggles with weight daily. Drinks large amount of mountain dew, coffee and craves carbs daily.     2) Follow up to discuss Cholesterol elevation - trying to change diet now. He has gained more weight in last six months. Family history of cholesterol and weight management problems. He denies nausea or abdominal pain other than his GERD for which he is treated off and on. Has not been a breakfast eater.      Review of Systems:   REVIEW OF SYSTEMS:  CONSTITUTIONAL: .  Does have, fatigue   NEUROLOGIC: Denies, paresthesias and any neurological problems  OPHTHALMIC: Denies, change in vision  CARDIOVASCULAR: Denies, chest pain or pressure at rest or during exercise, palpitations  ENDOCRINE: Denies, polyuria, polydipsia, heat intolerance, cold intolerance. Positive for increased hunger  HEME/LYMPH:  Denies and Anemia  ALLERGY:  Denies, food allergies  PSYCHOSOCIAL: .  Does report depression      Patient Active Problem List   Diagnosis   . Depressive disorder, not elsewhere classified   . Allergic rhinitis, cause unspecified   . Ankylosing spondylitis ([place])   . Chronic pain   . Hypertension   . Hypothyroidism   . Foot pain   . Other acquired absence of organ   . Other postprocedural status(V45.89)   . Unspecified sleep apnea   . Asthma   . Cough   . Allergic rhinitis due to allergen   . Restless legs syndrome   . Anterior corneal dystrophy   . Sleep apnea   . Acute vestibular neuritis   . Hypertriglyceridemia     Current Outpatient Prescriptions   Medication Sig Dispense Refill   . Acetaminophen-Codeine 300-15 MG Oral Tab One or two tablets every 6 hours as needed. 56 tablet 0   . Albuterol Sulfate (2.5 MG/3ML) 0.083% Inhalation Nebu Soln Inhale 3 mL (2.5 mg) via nebulizer every 6 hours as  needed for shortness of breath/wheezing. 100 vial 1   . Albuterol Sulfate HFA (PROAIR HFA) 108 (90 BASE) MCG/ACT Inhalation Aero Soln Inhale 2 puffs by mouth every 4 hours as needed (asthma). For Asthma. May use every 2 hours for attacks, or pereventively before exercise. 1 Inhaler 6   . Budesonide 0.5 MG/2ML Inhalation Suspension Inhale 2 mL (0.5 mg) via nebulizer every 12 hours. 120 mL 1   . Certolizumab Pegol (CIMZIA PREFILLED) 2 X 200 MG/ML Subcutaneous Kit Inject 200 mg under the skin every 2 weeks.     . Clotrimazole-Betamethasone 1-0.05 % External Cream Apply 1 application topically 2 times a day as needed (rash). Apply to buttocks 45 g 2   . DULoxetine HCl 60 MG Oral CAPSULE ENTERIC COATED PARTICLES Take 1 capsule (60 mg) by mouth daily. 90 capsule 3   . Esomeprazole Magnesium 40 MG Oral CAPSULE DELAYED RELEASE Take 1 capsule (40 mg) by mouth 2 times a day. Take on empty stomach 180 capsule 1   . Fexofenadine-Pseudoephed ER 60-120 MG Oral TABLET SR 12 HR Take 1 tablet by mouth every 12 hours as needed for allergies. 30 tablet 1   . Fluticasone Propionate 50 MCG/ACT Nasal Suspension Spray 2 sprays into each nostril 2 times a day. 1 bottle 3   . Formoterol Fumarate 20 MCG/2ML Inhalation Nebu Soln  Inhale 2 mL via nebulizer 2 times a day. 60 mL 1   . Ipratropium-Albuterol 20-100 MCG/ACT Inhalation Aero Soln Inhale 2 puffs by mouth every 4 hours as needed (cough). 1 Inhaler 11   . LamoTRIgine 100 MG Oral Tab Week 5: 100 mg once daily; Week 6 and maintenance: 200 mg once daily. 180 tablet 2   . LamoTRIgine 25 MG Oral Tab Weeks 1 and 2: 25 mg once daily; Weeks 3 and 4: 50 mg once daily; 45 tablet 0   . Levothyroxine Sodium 88 MCG Oral Tab Take 1 tablet (88 mcg) by mouth daily on an empty stomach. 90 tablet 2   . Losartan Potassium 50 MG Oral Tab One tablet daily by mouth 90 tablet 3   . Meclizine HCl 25 MG Oral Tab Take 2 tablets (50 mg) by mouth 2 times a day as needed for dizziness or nausea/vomiting. 60 tablet 0    . Meloxicam 15 MG Oral Tab Take 1 tablet (15 mg) by mouth daily. 90 tablet 2   . Montelukast Sodium 10 MG Oral Tab Take 1 tablet (10 mg) by mouth every evening. 90 tablet 3   . Olopatadine HCl 0.2 % Ophthalmic Solution Place 1 drop in each EYE daily. 2.5 mL 2   . Olopatadine HCl 0.6 % Nasal Solution Spray 2 sprays into each nostril 2 times a day. 1 bottle 11   . Ondansetron 8 MG Oral TABLET DISPERSIBLE Dissolve 1 tablet (8 mg) on top of tongue and swallow every 8 hours as needed for nausea/vomiting. 60 tablet 1   . Pregabalin (LYRICA) 75 MG Oral Cap Three tablets at dinner and three tablets at bedtime 180 capsule 2   . Respiratory Therapy Supplies (NEBULIZER) Does not apply Device Use every 6 hours as needed for other (chronic bronchitis). Chronic bronchitis 1 Device 0   . ROPINIRole HCl 0.25 MG Oral Tab By mouth, 1 tab (0.25 mg) once daily 1 to 3 hours before bedtime & in 2 days increase to  0.5 mg daily (2 tabs), and after 7 days to 1 mg daily. Dose may be further titrated upward in 0.5 mg increments every week until reaching a daily dose of 3 mg during week 6. Daily dose may be increased to a maximum of 4 mg beginning week 7. 120 tablet 2   . Spacer/Aero-Holding Chambers (AEROCHAMBER Z-STAT PLUS) Misc Use with inhaler as directed. 1 Device 1     No current facility-administered medications for this visit.       Allergies-Morphine and Penicillins    History sections of chart reviewed and updated today: Yes See medical, surgical, social, and family.    Objective  PHYSICAL EXAM:  General: healthy, alert, no distress, cooperative  Skin: Skin color, texture, turgor normal. No rashes or concerning lesions  Head: Normocephalic. No masses, lesions, tenderness or abnormalities  Eyes: Lids/periorbital skin normal, Conjunctivae/corneas clear  Lungs: clear to auscultation  Heart: normal rate, regular rhythm and no murmurs, clicks, or gallops  Back: negative  Abd: exam deferred  Psych: smiling, mood is appropriate to  situation          Medical Decision Making:    low complexity       ASSESSMENT/PLAN:      (E11.9) Type 2 diabetes mellitus without complication, without long-term current use of insulin (HCC)  (primary encounter diagnosis)  Plan: MetFORMIN HCl ER 500 MG Oral TABLET SR 24 HR      Start medication to help control his glucose  metabolism    (E78.5) Hyperlipidemia, unspecified hyperlipidemia type  Plan: Atorvastatin Calcium 20 MG Oral Tab       Provided information regarding calorie intake and output    Declined weight referral at this time.  Will ask Care Management to intervene          Patient was provided patient education and follow up instructions on AVS    Health Maintenance reviewed -  Health Maintenance Due   Topic   . Influenza Vaccine (1)   . Colon Cancer Screen w/ FOBT/FIT        Patient Instructions   We're Moving!!  As of January 2017 our new address will be [address] in [place].      It was a pleasure to see you in clinic today.                If you are not yet signed up for eCare, please see the below eCare section at the end of this document for how to enroll and to get your access codes.  It's easy to sign up at home today.    eCare  enrollment will allow you access to the below benefits   You can make appointments online   View test results / Lab Results   Request prescription renewals   Obtain a copy of our After Visit Summary (an electronic copy of this document)     Your Test Results:  If labs were ordered today the results are expected to be available via eCare in about 5 days. If you have an active eCare account, this is how we will notify you of your results.     If you do not have an eCare account then your test results will be mailed to you within about 14 days after your tests are completed. If your physician needs to change your care based on your results or is concerned, you should expect a phone call or eCare message from your provider.    If you have any questions about  your test results please schedule an appointment with your provider, so that we may review them with you in greater detail.    **If it has been more than 2 weeks and you have not received your test results please send our office a message via [place].    Medication Refills: If you need a prescription refilled, please contact your pharmacy 1 week before your current supply will run out to request the refill.  Contacting your pharmacy is the fastest and safest way to obtain a medication refill.  The pharmacy will notify our office.  Please note, that a minimum of 48 to 72 hours is needed to refill a medication,  Please call your pharmacy early to allow enough time to refill before you anticipate running out.  For faster medication refills, you can also schedule an appointment with your provider.    We know you have a choice in where you receive your healthcare and we sincerely thank you for trusting [facility] with your health.

## 2015-05-17 NOTE — Progress Notes (Signed)
Reviewed eCare status with Patient:  YES    HEALTH MAINTENANCE:  Has the patient had any of these since their last visit?    Cervical screening/PAP: Not Due     Mammo: Not Due    Colon Screen: Not Due    Diabetic Eye Exam: Not Due      Have you seen a specialist since your last visit: No        HM Due:   Health Maintenance   Topic Date Due   . Influenza Vaccine (1) 02/23/2015   . Colon Cancer Screen w/ FOBT/FIT  07/01/2015   . Diabetes Screen  04/09/2018   . Cholesterol Test  04/03/2020   . Tetanus Vaccine  06/25/2024   . Hepatitis C Screen  Addressed   . HIV Screen  Addressed           Future Appointments  Date Time Provider Department Center   05/17/2015 10:20 AM Daphane ShepherdMeyer, Delcie RochKerry Ellen, ARNP [facility]

## 2015-05-24 ENCOUNTER — Other Ambulatory Visit (INDEPENDENT_AMBULATORY_CARE_PROVIDER_SITE_OTHER): Payer: Self-pay

## 2015-05-24 NOTE — Progress Notes (Signed)
Attempt#1. Left message to call back

## 2015-05-25 ENCOUNTER — Other Ambulatory Visit (INDEPENDENT_AMBULATORY_CARE_PROVIDER_SITE_OTHER): Payer: Self-pay

## 2015-05-25 NOTE — Progress Notes (Signed)
Pt returns my call.  Pt is interested in setting up initial care management appointment.  Will check with home schedule and call me back in the next couple of days to schedule appointment.

## 2015-06-01 ENCOUNTER — Other Ambulatory Visit (INDEPENDENT_AMBULATORY_CARE_PROVIDER_SITE_OTHER): Payer: Self-pay

## 2015-06-07 ENCOUNTER — Encounter (INDEPENDENT_AMBULATORY_CARE_PROVIDER_SITE_OTHER): Payer: Self-pay | Admitting: Family

## 2015-06-07 ENCOUNTER — Ambulatory Visit (INDEPENDENT_AMBULATORY_CARE_PROVIDER_SITE_OTHER): Payer: BLUE CROSS/BLUE SHIELD | Admitting: Family

## 2015-06-07 VITALS — BP 148/90 | HR 78 | Temp 97.2°F | Wt 253.8 lb

## 2015-06-07 DIAGNOSIS — E1169 Type 2 diabetes mellitus with other specified complication: Secondary | ICD-10-CM

## 2015-06-07 DIAGNOSIS — E669 Obesity, unspecified: Secondary | ICD-10-CM

## 2015-06-07 DIAGNOSIS — E119 Type 2 diabetes mellitus without complications: Secondary | ICD-10-CM

## 2015-06-07 DIAGNOSIS — H539 Unspecified visual disturbance: Secondary | ICD-10-CM

## 2015-06-07 DIAGNOSIS — J069 Acute upper respiratory infection, unspecified: Secondary | ICD-10-CM

## 2015-06-07 DIAGNOSIS — J45901 Unspecified asthma with (acute) exacerbation: Secondary | ICD-10-CM

## 2015-06-07 MED ORDER — PREDNISONE 20 MG OR TABS
ORAL_TABLET | ORAL | Status: DC
Start: 2015-06-07 — End: 2015-06-14

## 2015-06-07 NOTE — Patient Instructions (Addendum)
We're Moving!!  As of January 2017 our new address will be [address] in [place].      It was a pleasure to see you in clinic today.                 1) Steam twice to three times per day.  2) Increase fluids 48 oz minimum per day  3) Vics can be helpful  4) Gargle with salt water OR baking soda  5) Use prednisone and avoid taking at night   6) Switch to Allegra D for now  7) Small frequent meals    Return to work Friday, December 16th, 2016     If you are not yet signed up for eCare, please see the below eCare section at the end of this document for how to enroll and to get your access codes.  It's easy to sign up at home today.    eCare  enrollment will allow you access to the below benefits   You can make appointments online   View test results / Lab Results   Request prescription renewals   Obtain a copy of our After Visit Summary (an electronic copy of this document)     Your Test Results:  If labs were ordered today the results are expected to be available via eCare in about 5 days. If you have an active eCare account, this is how we will notify you of your results.     If you do not have an eCare account then your test results will be mailed to you within about 14 days after your tests are completed. If your physician needs to change your care based on your results or is concerned, you should expect a phone call or eCare message from your provider.    If you have any questions about your test results please schedule an appointment with your provider, so that we may review them with you in greater detail.    **If it has been more than 2 weeks and you have not received your test results please send our office a message via eCare.    Medication Refills: If you need a prescription refilled, please contact your pharmacy 1 week before your current supply will run out to request the refill.  Contacting your pharmacy is the fastest and safest way to obtain a medication refill.  The pharmacy will notify  our office.  Please note, that a minimum of 48 to 72 hours is needed to refill a medication,  Please call your pharmacy early to allow enough time to refill before you anticipate running out.  For faster medication refills, you can also schedule an appointment with your provider.    We know you have a choice in where you receive your healthcare and we sincerely thank you for trusting [facility] with your health.

## 2015-06-07 NOTE — Progress Notes (Signed)
Subjective:  Anthony Andrade is a 49 year old Male here with chief complaint(s):     1) Cough since Sunday. Dry, bronchitis like. Gets this every year. No fever. Feels tight in the chest. Feels short of breath. Mild wheezing. Cough is bothersome at night but using Tessalon pearls. No aches or pains. Sinuses hurt from dryness.   Taking mucinex DM with 'some' relief.     Past Medical History   Diagnosis Date   . Ankylosing spondylitis ([place])    . Esophageal reflux    . Allergic rhinitis due to other allergen    . Unspecified sleep apnea    . Restless legs syndrome (RLS)      Past Surgical History   Procedure Laterality Date   . Cholecystectomy     . Unlisted procedure shoulder       left shoulder   . Esophagogastroduodenoscopy transoral diagnostic  2008     on prevacid   . Colonoscopy stoma dx including collj spec spx  2008     normal per patient     family history is not on file.  Past Surgical History   Procedure Laterality Date   . Cholecystectomy     . Unlisted procedure shoulder       left shoulder   . Esophagogastroduodenoscopy transoral diagnostic  2008     on prevacid   . Colonoscopy stoma dx including collj spec spx  2008     normal per patient       Patient Active Problem List   Diagnosis   . Depressive disorder, not elsewhere classified   . Allergic rhinitis, cause unspecified   . Ankylosing spondylitis ([place])   . Chronic pain   . Hypertension   . Hypothyroidism   . Foot pain   . Other acquired absence of organ   . Other postprocedural status(V45.89)   . Unspecified sleep apnea   . Asthma   . Cough   . Allergic rhinitis due to allergen   . Restless legs syndrome   . Anterior corneal dystrophy   . Sleep apnea   . Acute vestibular neuritis   . Hypertriglyceridemia     Current Outpatient Prescriptions   Medication Sig Dispense Refill   . Acetaminophen-Codeine 300-15 MG Oral Tab One or two tablets every 6 hours as needed. 56 tablet 0   . Albuterol Sulfate (2.5 MG/3ML) 0.083% Inhalation Nebu Soln Inhale 3 mL  (2.5 mg) via nebulizer every 6 hours as needed for shortness of breath/wheezing. 100 vial 1   . Albuterol Sulfate HFA (PROAIR HFA) 108 (90 BASE) MCG/ACT Inhalation Aero Soln Inhale 2 puffs by mouth every 4 hours as needed (asthma). For Asthma. May use every 2 hours for attacks, or pereventively before exercise. 1 Inhaler 6   . Atorvastatin Calcium 20 MG Oral Tab Take 1 tablet (20 mg) by mouth daily. To lower Cholesterol. 90 tablet 1   . Budesonide 0.5 MG/2ML Inhalation Suspension Inhale 2 mL (0.5 mg) via nebulizer every 12 hours. 120 mL 1   . Certolizumab Pegol (CIMZIA PREFILLED) 2 X 200 MG/ML Subcutaneous Kit Inject 200 mg under the skin every 2 weeks.     . Clotrimazole-Betamethasone 1-0.05 % External Cream Apply 1 application topically 2 times a day as needed (rash). Apply to buttocks 45 g 2   . DULoxetine HCl 60 MG Oral CAPSULE ENTERIC COATED PARTICLES Take 1 capsule (60 mg) by mouth daily. 90 capsule 3   . Esomeprazole Magnesium 40 MG Oral CAPSULE  DELAYED RELEASE Take 1 capsule (40 mg) by mouth 2 times a day. Take on empty stomach 180 capsule 1   . Fexofenadine-Pseudoephed ER 60-120 MG Oral TABLET SR 12 HR Take 1 tablet by mouth every 12 hours as needed for allergies. 30 tablet 1   . Fluticasone Propionate 50 MCG/ACT Nasal Suspension Spray 2 sprays into each nostril 2 times a day. 1 bottle 3   . Formoterol Fumarate 20 MCG/2ML Inhalation Nebu Soln Inhale 2 mL via nebulizer 2 times a day. 60 mL 1   . Ipratropium-Albuterol 20-100 MCG/ACT Inhalation Aero Soln Inhale 2 puffs by mouth every 4 hours as needed (cough). 1 Inhaler 11   . LamoTRIgine 100 MG Oral Tab Week 5: 100 mg once daily; Week 6 and maintenance: 200 mg once daily. 180 tablet 2   . LamoTRIgine 25 MG Oral Tab Weeks 1 and 2: 25 mg once daily; Weeks 3 and 4: 50 mg once daily; 45 tablet 0   . Levothyroxine Sodium 88 MCG Oral Tab Take 1 tablet (88 mcg) by mouth daily on an empty stomach. 90 tablet 2   . Losartan Potassium 50 MG Oral Tab One tablet daily by  mouth 90 tablet 3   . Meclizine HCl 25 MG Oral Tab Take 2 tablets (50 mg) by mouth 2 times a day as needed for dizziness or nausea/vomiting. 60 tablet 0   . Meloxicam 15 MG Oral Tab Take 1 tablet (15 mg) by mouth daily. 90 tablet 2   . MetFORMIN HCl ER 500 MG Oral TABLET SR 24 HR Take 1 tablet (500 mg) by mouth daily. 30 tablet 3   . Montelukast Sodium 10 MG Oral Tab Take 1 tablet (10 mg) by mouth every evening. 90 tablet 3   . Olopatadine HCl 0.2 % Ophthalmic Solution Place 1 drop in each EYE daily. 2.5 mL 2   . Olopatadine HCl 0.6 % Nasal Solution Spray 2 sprays into each nostril 2 times a day. 1 bottle 11   . Ondansetron 8 MG Oral TABLET DISPERSIBLE Dissolve 1 tablet (8 mg) on top of tongue and swallow every 8 hours as needed for nausea/vomiting. 60 tablet 1   . Pregabalin (LYRICA) 75 MG Oral Cap Three tablets at dinner and three tablets at bedtime 180 capsule 2   . Respiratory Therapy Supplies (NEBULIZER) Does not apply Device Use every 6 hours as needed for other (chronic bronchitis). Chronic bronchitis 1 Device 0   . ROPINIRole HCl 0.25 MG Oral Tab By mouth, 1 tab (0.25 mg) once daily 1 to 3 hours before bedtime & in 2 days increase to  0.5 mg daily (2 tabs), and after 7 days to 1 mg daily. Dose may be further titrated upward in 0.5 mg increments every week until reaching a daily dose of 3 mg during week 6. Daily dose may be increased to a maximum of 4 mg beginning week 7. 120 tablet 2   . Spacer/Aero-Holding Chambers (AEROCHAMBER Z-STAT PLUS) Misc Use with inhaler as directed. 1 Device 1     No current facility-administered medications for this visit.     Social History     Social History   . Marital Status: Married     Spouse Name: N/A   . Number of Children: N/A   . Years of Education: N/A     Occupational History   . Not on file.     Social History Main Topics   . Smoking status: Former Research scientist (life sciences)   . Smokeless  tobacco: Never Used   . Alcohol Use: No   . Drug Use: No   . Sexual Activity: Not on file     Other  Topics Concern   . Not on file     Social History Narrative    Recently moved from [place].  Works for [employer].  One daughter.  Sister is an Therapist, sports.         02/15/12    Lives with his wife.    Daughter is studious     Son has been diagnosed with mild asperger's syndrome.    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   Eyes: Negative    Ears, Nose, Mouth, Throat: Negative    Cardiovascular: Positive for high at doctors office but not at home.   Respiratory: mild wheezing   Gastrointestinal: Negative   Genitourinary: Negative   Musculoskeletal: Negative    Skin: Negative    Neurological: Negative for abnormal sensations    Psychiatric: Negative for anxiety    Endocrine: Negative for polyuria and polydypsia    OBJECTIVE:    Physical:   Physical Exam   Constitutional: He is well-developed, well-nourished, and in no distress. No distress.   HENT:   Head: Normocephalic.   Right Ear: External ear normal.   Left Ear: External ear normal.   Mouth/Throat: No oropharyngeal exudate.   Eyes: EOM are normal. Pupils are equal, round, and reactive to light.   Neck: No thyromegaly present.   Cardiovascular: Normal rate and normal heart sounds.    No murmur heard.  Pulmonary/Chest: Effort normal. No respiratory distress. He has wheezes in the left upper field. He has rhonchi in the right middle field. He has no rales.   Abdominal: Soft. Bowel sounds are normal. There is no tenderness.   Neurological: He is alert. No cranial nerve deficit. Gait normal.   Skin: Skin is warm and dry.   Psychiatric: Affect normal.       IMPRESSION:    I spent a total time of 25 minutes face-to-face with the patient, of which more than 50% was spent counseling and coordinating care as outlined in this note.    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  2) Increase fluids 48 oz minimum per day  3) Vics can be helpful  4) Gargle with salt water OR baking soda  5) Use prednisone and avoid taking at night   6) Switch to Allegra D for now  7) Small frequent meals    Return to work Friday, December 16th, 2016    Orders per documented in this encounter  Patient instructions per documented in this encounter

## 2015-06-07 NOTE — Progress Notes (Signed)
Reviewed eCare status with Patient:  YES    HEALTH MAINTENANCE:  Has the patient had any of these since their last visit?    Cervical screening/PAP: Not Due     Mammo: Not Due    Colon Screen: Not Due    Diabetic Eye Exam: Due referral pending      Have you seen a specialist since your last visit: No        HM Due:   Health Maintenance   Topic Date Due   . Diabetes Foot Exam  04/24/1982   . Diabetes Eye Exam  04/24/1982   . Influenza Vaccine (1) 02/23/2015   . Colon Cancer Screen w/ FOBT/FIT  07/01/2015   . A1c  10/09/2015   . Diabetes Screen  04/09/2018   . Cholesterol Test  04/03/2020   . Tetanus Vaccine  06/25/2024   . Hepatitis C Screen  Addressed   . HIV Screen  Addressed           Future Appointments  Date Time Provider Department Center   06/07/2015 10:20 AM Daphane ShepherdMeyer, Delcie RochKerry Ellen, ARNP [facility]

## 2015-06-09 ENCOUNTER — Ambulatory Visit (INDEPENDENT_AMBULATORY_CARE_PROVIDER_SITE_OTHER): Payer: BLUE CROSS/BLUE SHIELD

## 2015-06-14 ENCOUNTER — Ambulatory Visit (INDEPENDENT_AMBULATORY_CARE_PROVIDER_SITE_OTHER): Payer: BLUE CROSS/BLUE SHIELD | Admitting: Family

## 2015-06-14 ENCOUNTER — Ambulatory Visit (INDEPENDENT_AMBULATORY_CARE_PROVIDER_SITE_OTHER): Payer: Self-pay | Admitting: Family

## 2015-06-14 ENCOUNTER — Telehealth (INDEPENDENT_AMBULATORY_CARE_PROVIDER_SITE_OTHER): Payer: Self-pay | Admitting: Family

## 2015-06-14 ENCOUNTER — Encounter (INDEPENDENT_AMBULATORY_CARE_PROVIDER_SITE_OTHER): Payer: Self-pay | Admitting: Family

## 2015-06-14 VITALS — BP 173/109 | HR 82 | Temp 98.4°F | Wt 254.8 lb

## 2015-06-14 DIAGNOSIS — R059 Cough, unspecified: Secondary | ICD-10-CM

## 2015-06-14 DIAGNOSIS — J411 Mucopurulent chronic bronchitis: Secondary | ICD-10-CM

## 2015-06-14 DIAGNOSIS — I10 Essential (primary) hypertension: Secondary | ICD-10-CM

## 2015-06-14 DIAGNOSIS — Z9109 Other allergy status, other than to drugs and biological substances: Secondary | ICD-10-CM

## 2015-06-14 DIAGNOSIS — J45901 Unspecified asthma with (acute) exacerbation: Secondary | ICD-10-CM

## 2015-06-14 DIAGNOSIS — R05 Cough: Secondary | ICD-10-CM

## 2015-06-14 MED ORDER — LOSARTAN POTASSIUM 50 MG OR TABS
ORAL_TABLET | ORAL | Status: DC
Start: 2015-06-14 — End: 2015-09-26

## 2015-06-14 MED ORDER — METHYLPREDNISOLONE 4 MG OR TABS
ORAL_TABLET | ORAL | Status: DC
Start: 2015-06-14 — End: 2015-08-09

## 2015-06-14 MED ORDER — BENZONATATE 200 MG OR CAPS
200.0000 mg | ORAL_CAPSULE | Freq: Three times a day (TID) | ORAL | Status: DC | PRN
Start: 2015-06-14 — End: 2016-07-24

## 2015-06-14 NOTE — Telephone Encounter (Signed)
Received a [employer]  form via fax. Form placed in Dr.Meyer's in box to complete    Main contact for form if there are questions/concerns: [employer]  Phone #: [phone]  Fax # [phone]  Please route back to the Team Pool when complete.

## 2015-06-14 NOTE — Progress Notes (Signed)
Quick Note:      Your lab results is normal.   Please let me know if you have any question.    ______

## 2015-06-14 NOTE — Progress Notes (Signed)
Reviewed eCare status with Patient:  YES    HEALTH MAINTENANCE:  Has the patient had any of these since their last visit?    Cervical screening/PAP: Not Due     Mammo: Not Due    Colon Screen: Not Due    Diabetic Eye Exam: Not Due      Have you seen a specialist since your last visit: No        HM Due:   Health Maintenance   Topic Date Due   . Diabetes Eye Exam  04/24/1982   . Influenza Vaccine (1) 02/23/2015   . Colon Cancer Screen w/ FOBT/FIT  07/01/2015   . A1c  10/09/2015   . Diabetes Foot Exam  06/06/2016   . Diabetes Screen  04/09/2018   . Cholesterol Test  04/03/2020   . Tetanus Vaccine  06/25/2024   . Hepatitis C Screen  Addressed   . HIV Screen  Addressed           No future appointments.

## 2015-06-14 NOTE — Telephone Encounter (Signed)
Patient has appointment today. Will complete the form with him

## 2015-06-14 NOTE — Patient Instructions (Addendum)
We're Moving!!  As of January 2017 our new address will be [address] in [place].      It was a pleasure to see you in clinic today.               Ipratropium-Albuterol 20-100 inhaler (COMBIVENT) - TWO puffs every 4 hours while awake.  Flonase nasal spray - one spray twice daily to both nostrils   Singulair 10 mg one tablet at bedtime  Allegra-D (one capsule every 12 hours)   Tessalon pearls every 8 hours as needed to control cough  Continue nebulizer treatment as needed in AM and PM if cough is not controlled.    For Hypertension:  Increase losartan 50 mg from once per day to TWICE per day.  Check b/p at home.    Let me know your status in 3 days.           If you are not yet signed up for eCare, please see the below eCare section at the end of this document for how to enroll and to get your access codes.  It's easy to sign up at home today.    eCare  enrollment will allow you access to the below benefits   You can make appointments online   View test results / Lab Results   Request prescription renewals   Obtain a copy of our After Visit Summary (an electronic copy of this document)     Your Test Results:  If labs were ordered today the results are expected to be available via eCare in about 5 days. If you have an active eCare account, this is how we will notify you of your results.     If you do not have an eCare account then your test results will be mailed to you within about 14 days after your tests are completed. If your physician needs to change your care based on your results or is concerned, you should expect a phone call or eCare message from your provider.    If you have any questions about your test results please schedule an appointment with your provider, so that we may review them with you in greater detail.    **If it has been more than 2 weeks and you have not received your test results please send our office a message via eCare.    Medication Refills: If you need a prescription  refilled, please contact your pharmacy 1 week before your current supply will run out to request the refill.  Contacting your pharmacy is the fastest and safest way to obtain a medication refill.  The pharmacy will notify our office.  Please note, that a minimum of 48 to 72 hours is needed to refill a medication,  Please call your pharmacy early to allow enough time to refill before you anticipate running out.  For faster medication refills, you can also schedule an appointment with your provider.    We know you have a choice in where you receive your healthcare and we sincerely thank you for trusting [facility] with your health.

## 2015-06-15 ENCOUNTER — Other Ambulatory Visit (INDEPENDENT_AMBULATORY_CARE_PROVIDER_SITE_OTHER): Payer: Self-pay

## 2015-06-15 NOTE — Progress Notes (Signed)
Attempt#1. Left message to call back

## 2015-06-17 NOTE — Progress Notes (Signed)
Subjective    Anthony Andrade is  here with chief complaint:    1) follow up for cough and sinus congestion. He did not return to work. Felt dizzy and too ill. He is battling the same cough he had last year. Sinus congestion and fatigue with dizziness. Coughs hard. WHeezing. Non productive. Feels tight.   Taking all medications and completed round of prednisone. Was told he had allergies and believes dust is part of the issue from furnace. Dry air.   Has follow up with allergist.      Review of Systems:     + night sweats   Sleep is disturbed  No new aches and pains  No vomiting now but + for some nausea  No chest pain       Patient Active Problem List   Diagnosis   . Depressive disorder, not elsewhere classified   . Allergic rhinitis, cause unspecified   . Ankylosing spondylitis ([place])   . Chronic pain   . Hypertension   . Hypothyroidism   . Foot pain   . Other acquired absence of organ   . Other postprocedural status(V45.89)   . Unspecified sleep apnea   . Asthma   . Cough   . Allergic rhinitis due to allergen   . Restless legs syndrome   . Anterior corneal dystrophy   . Sleep apnea   . Acute vestibular neuritis   . Hypertriglyceridemia     Current Outpatient Prescriptions   Medication Sig Dispense Refill   . Albuterol Sulfate (2.5 MG/3ML) 0.083% Inhalation Nebu Soln Inhale 3 mL (2.5 mg) via nebulizer every 6 hours as needed for shortness of breath/wheezing. 100 vial 1   . Atorvastatin Calcium 20 MG Oral Tab Take 1 tablet (20 mg) by mouth daily. To lower Cholesterol. 90 tablet 1   . Benzonatate 200 MG Oral Cap Take 1 capsule (200 mg) by mouth 3 times a day as needed for cough. 30 capsule 0   . Budesonide 0.5 MG/2ML Inhalation Suspension Inhale 2 mL (0.5 mg) via nebulizer every 12 hours. 120 mL 1   . Certolizumab Pegol (CIMZIA PREFILLED) 2 X 200 MG/ML Subcutaneous Kit Inject 200 mg under the skin every 2 weeks.     . Clotrimazole-Betamethasone 1-0.05 % External Cream Apply 1 application topically 2 times a day  as needed (rash). Apply to buttocks 45 g 2   . DULoxetine HCl 60 MG Oral CAPSULE ENTERIC COATED PARTICLES Take 1 capsule (60 mg) by mouth daily. 90 capsule 3   . Esomeprazole Magnesium 40 MG Oral CAPSULE DELAYED RELEASE Take 1 capsule (40 mg) by mouth 2 times a day. Take on empty stomach 180 capsule 1   . Fexofenadine-Pseudoephed ER 60-120 MG Oral TABLET SR 12 HR Take 1 tablet by mouth every 12 hours as needed for allergies. 30 tablet 1   . Fluticasone Propionate 50 MCG/ACT Nasal Suspension Spray 1 spray into each nostril 2 times a day. 1 bottle 3   . Ipratropium-Albuterol 20-100 MCG/ACT Inhalation Aero Soln Inhale 2 puffs by mouth every 4 hours as needed (cough). 1 Inhaler 11   . LamoTRIgine 100 MG Oral Tab Week 5: 100 mg once daily; Week 6 and maintenance: 200 mg once daily. 180 tablet 2   . LamoTRIgine 25 MG Oral Tab Weeks 1 and 2: 25 mg once daily; Weeks 3 and 4: 50 mg once daily; 45 tablet 0   . Levothyroxine Sodium 88 MCG Oral Tab Take 1 tablet (88 mcg) by  mouth daily on an empty stomach. 90 tablet 2   . Losartan Potassium 50 MG Oral Tab One tablet twice daily 60 tablet 1   . Meclizine HCl 25 MG Oral Tab Take 2 tablets (50 mg) by mouth 2 times a day as needed for dizziness or nausea/vomiting. 60 tablet 0   . Meloxicam 15 MG Oral Tab Take 1 tablet (15 mg) by mouth daily. 90 tablet 2   . MetFORMIN HCl ER 500 MG Oral TABLET SR 24 HR Take 1 tablet (500 mg) by mouth daily. 30 tablet 3   . MethylPREDNISolone 4 MG Oral Tab Six tablets today, then one tablet less each day until gone. 21 tablet 0   . Montelukast Sodium 10 MG Oral Tab Take 1 tablet (10 mg) by mouth every evening. 90 tablet 3   . Olopatadine HCl 0.2 % Ophthalmic Solution Place 1 drop in each EYE daily. 2.5 mL 2   . Olopatadine HCl 0.6 % Nasal Solution Spray 2 sprays into each nostril 2 times a day. 1 bottle 11   . Ondansetron 8 MG Oral TABLET DISPERSIBLE Dissolve 1 tablet (8 mg) on top of tongue and swallow every 8 hours as needed for nausea/vomiting. 60  tablet 1   . Pregabalin (LYRICA) 75 MG Oral Cap Three tablets at dinner and three tablets at bedtime 180 capsule 2   . Respiratory Therapy Supplies (NEBULIZER) Does not apply Device Use every 6 hours as needed for other (chronic bronchitis). Chronic bronchitis 1 Device 0   . ROPINIRole HCl 0.25 MG Oral Tab By mouth, 1 tab (0.25 mg) once daily 1 to 3 hours before bedtime & in 2 days increase to  0.5 mg daily (2 tabs), and after 7 days to 1 mg daily. Dose may be further titrated upward in 0.5 mg increments every week until reaching a daily dose of 3 mg during week 6. Daily dose may be increased to a maximum of 4 mg beginning week 7. 120 tablet 2   . Spacer/Aero-Holding Chambers (AEROCHAMBER Z-STAT PLUS) Misc Use with inhaler as directed. 1 Device 1     No current facility-administered medications for this visit.       Allergies-Morphine and Penicillins    History sections of chart reviewed and updated today: Yes See medical, surgical, social, and family.    Objective  Anxious male with elevated b/p.   Cough is deep, dry.  Voice is hoarse.   HEENT: clear drainage from nose, mild lavender, inflammation. Ears - TMs negative today. Canals clear.  No bruits, neck supple, no lymph node enlargement. Pharyngeal area is mildly inflammed, mucous clear / white noted   Chest: No wheezing, breath sounds are clear anterior upper lobes, clear posterior upper lobes, clear lower lobes but breath sounds difficult to hear on right side.  Neuro CN II-XII intact and symmetric, alert, oriented x 3. Gait is steady  Mood: mildly anxious      Medical Decision Making:    low complexity       ASSESSMENT/PLAN:      (R05) Cough  (primary encounter diagnosis)  Plan: XR CHEST 2 VW, Benzonatate 200 MG Oral Cap        Restrictive airway with concern of decreased breath sound in right lower lobe    (J41.1) Mucopurulent chronic bronchitis (HCC)  Plan: Albuterol Sulfate (2.5 MG/3ML) 0.083%         Inhalation Nebu Soln        Continue push fluids, team  and clean  filters, air filter strongly recommended    (J45.901) Asthma exacerbation  Plan: MethylPREDNISolone 4 MG Oral Tab        Second round of prednisone     (Z91.09) Environmental allergies  Plan: Fluticasone Propionate 50 MCG/ACT Nasal         Suspension        Continue medication    (I10) Essential hypertension  Plan: Losartan Potassium 50 MG Oral Tab          Patient Instructions   We're Moving!!  As of January 2017 our new address will be [place] in [place].      It was a pleasure to see you in clinic today.               Ipratropium-Albuterol 20-100 inhaler (COMBIVENT) - TWO puffs every 4 hours while awake.  Flonase nasal spray - one spray twice daily to both nostrils   Singulair 10 mg one tablet at bedtime  Allegra-D (one capsule every 12 hours)   Tessalon pearls every 8 hours as needed to control cough  Continue nebulizer treatment as needed in AM and PM if cough is not controlled.    For Hypertension:  Increase losartan 50 mg from once per day to TWICE per day.  Check b/p at home.  Let me know your status in 3 days.           If you are not yet signed up for eCare, please see the below eCare section at the end of this document for how to enroll and to get your access codes.  It's easy to sign up at home today.    eCare  enrollment will allow you access to the below benefits   You can make appointments online   View test results / Lab Results   Request prescription renewals   Obtain a copy of our After Visit Summary (an electronic copy of this document)     Your Test Results:  If labs were ordered today the results are expected to be available via eCare in about 5 days. If you have an active eCare account, this is how we will notify you of your results.     If you do not have an eCare account then your test results will be mailed to you within about 14 days after your tests are completed. If your physician needs to change your care based on your results or is concerned, you should  expect a phone call or eCare message from your provider.    If you have any questions about your test results please schedule an appointment with your provider, so that we may review them with you in greater detail.    **If it has been more than 2 weeks and you have not received your test results please send our office a message via [place].    Medication Refills: If you need a prescription refilled, please contact your pharmacy 1 week before your current supply will run out to request the refill.  Contacting your pharmacy is the fastest and safest way to obtain a medication refill.  The pharmacy will notify our office.  Please note, that a minimum of 48 to 72 hours is needed to refill a medication,  Please call your pharmacy early to allow enough time to refill before you anticipate running out.  For faster medication refills, you can also schedule an appointment with your provider.    We know you have a choice in  where you receive your healthcare and we sincerely thank you for trusting [facility] with your health.                  Patient was provided patient education and follow up instructions on AVS    Health Maintenance reviewed -  Health Maintenance Due   Topic   . Diabetes Eye Exam    . Influenza Vaccine (1)   . Colon Cancer Screen w/ FOBT/FIT        Patient Instructions   We're Moving!!  As of January 2017 our new address will be [place] in [place].      It was a pleasure to see you in clinic today.               Ipratropium-Albuterol 20-100 inhaler (COMBIVENT) - TWO puffs every 4 hours while awake.  Flonase nasal spray - one spray twice daily to both nostrils   Singulair 10 mg one tablet at bedtime  Allegra-D (one capsule every 12 hours)   Tessalon pearls every 8 hours as needed to control cough  Continue nebulizer treatment as needed in AM and PM if cough is not controlled.    For Hypertension:  Increase losartan 50 mg from once per day to TWICE per day.  Check b/p at  home.    Let me know your status in 3 days.           If you are not yet signed up for eCare, please see the below eCare section at the end of this document for how to enroll and to get your access codes.  It's easy to sign up at home today.    eCare  enrollment will allow you access to the below benefits   You can make appointments online   View test results / Lab Results   Request prescription renewals   Obtain a copy of our After Visit Summary (an electronic copy of this document)     Your Test Results:  If labs were ordered today the results are expected to be available via eCare in about 5 days. If you have an active eCare account, this is how we will notify you of your results.     If you do not have an eCare account then your test results will be mailed to you within about 14 days after your tests are completed. If your physician needs to change your care based on your results or is concerned, you should expect a phone call or eCare message from your provider.    If you have any questions about your test results please schedule an appointment with your provider, so that we may review them with you in greater detail.    **If it has been more than 2 weeks and you have not received your test results please send our office a message via [place].    Medication Refills: If you need a prescription refilled, please contact your pharmacy 1 week before your current supply will run out to request the refill.  Contacting your pharmacy is the fastest and safest way to obtain a medication refill.  The pharmacy will notify our office.  Please note, that a minimum of 48 to 72 hours is needed to refill a medication,  Please call your pharmacy early to allow enough time to refill before you anticipate running out.  For faster medication refills, you can also schedule an appointment with your provider.  We know you have a choice in where you receive your healthcare and we sincerely thank you for trusting [facility] with your health.

## 2015-06-20 ENCOUNTER — Telehealth (INDEPENDENT_AMBULATORY_CARE_PROVIDER_SITE_OTHER): Payer: Self-pay | Admitting: Family

## 2015-06-20 NOTE — Telephone Encounter (Signed)
Was form addressed at appointment?

## 2015-06-20 NOTE — Telephone Encounter (Signed)
Second form completed  Needs Tax ID

## 2015-06-20 NOTE — Telephone Encounter (Signed)
Received a [employer]  form via fax. Form placed in Dr.Meyer's in box to complete    Main contact for form if there are questions/concerns: [employer]  Phone #:  Fax #  Please route back to the Team Pool when complete.

## 2015-06-21 NOTE — Telephone Encounter (Signed)
Received the completed [employer] form from Dr. Daphane ShepherdMeyer.       Form faxed back to [employer] at  fax # [phone].      All faxed items will be kept in fax bin for 2 weeks and then sorted to:  (1) A copy of any signed documents and home health orders will be kept on site for 60 days.  (2) L&I, FMLA and Initial/Discharge physical therapy will be scanned to the chart.  (3) All other items will be shred after 2 weeks.

## 2015-06-21 NOTE — Telephone Encounter (Signed)
Received the completed [facility] form from Dr. Daphane ShepherdMeyer.       Form faxed back to [facility] at  fax # [phone].      All faxed items will be kept in fax bin for 2 weeks and then sorted to:  (1) A copy of any signed documents and home health orders will be kept on site for 60 days.  (2) L&I, FMLA and Initial/Discharge physical therapy will be scanned to the chart.  (3) All other items will be shred after 2 weeks.

## 2015-06-28 ENCOUNTER — Other Ambulatory Visit (INDEPENDENT_AMBULATORY_CARE_PROVIDER_SITE_OTHER): Payer: Self-pay

## 2015-06-28 NOTE — Progress Notes (Signed)
Attempt #2.  Left message to call back.

## 2015-07-05 ENCOUNTER — Other Ambulatory Visit (INDEPENDENT_AMBULATORY_CARE_PROVIDER_SITE_OTHER): Payer: Self-pay

## 2015-07-05 NOTE — Progress Notes (Signed)
Attempt #3. Left message to call back

## 2015-07-12 ENCOUNTER — Other Ambulatory Visit (INDEPENDENT_AMBULATORY_CARE_PROVIDER_SITE_OTHER): Payer: Self-pay | Admitting: Family

## 2015-07-12 DIAGNOSIS — Z9109 Other allergy status, other than to drugs and biological substances: Secondary | ICD-10-CM

## 2015-07-13 MED ORDER — MONTELUKAST SODIUM 10 MG OR TABS
ORAL_TABLET | ORAL | Status: DC
Start: 2015-07-13 — End: 2017-08-25

## 2015-07-21 ENCOUNTER — Ambulatory Visit (INDEPENDENT_AMBULATORY_CARE_PROVIDER_SITE_OTHER): Payer: Self-pay | Admitting: Family

## 2015-07-21 ENCOUNTER — Other Ambulatory Visit (INDEPENDENT_AMBULATORY_CARE_PROVIDER_SITE_OTHER): Payer: Self-pay

## 2015-07-21 NOTE — Progress Notes (Signed)
Lost to contact letter mailed.

## 2015-07-28 ENCOUNTER — Encounter (INDEPENDENT_AMBULATORY_CARE_PROVIDER_SITE_OTHER): Payer: Self-pay | Admitting: Family

## 2015-07-28 ENCOUNTER — Ambulatory Visit (INDEPENDENT_AMBULATORY_CARE_PROVIDER_SITE_OTHER): Payer: BLUE CROSS/BLUE SHIELD | Admitting: Family

## 2015-07-28 VITALS — BP 145/93 | HR 76 | Temp 98.1°F | Wt 253.8 lb

## 2015-07-28 DIAGNOSIS — Z129 Encounter for screening for malignant neoplasm, site unspecified: Secondary | ICD-10-CM

## 2015-07-28 DIAGNOSIS — E119 Type 2 diabetes mellitus without complications: Secondary | ICD-10-CM

## 2015-07-28 DIAGNOSIS — R110 Nausea: Secondary | ICD-10-CM

## 2015-07-28 DIAGNOSIS — Z9109 Other allergy status, other than to drugs and biological substances: Secondary | ICD-10-CM

## 2015-07-28 DIAGNOSIS — R42 Dizziness and giddiness: Secondary | ICD-10-CM

## 2015-07-28 DIAGNOSIS — Z135 Encounter for screening for eye and ear disorders: Secondary | ICD-10-CM

## 2015-07-28 DIAGNOSIS — K2900 Acute gastritis without bleeding: Secondary | ICD-10-CM

## 2015-07-28 MED ORDER — ONDANSETRON 8 MG OR TBDP
8.0000 mg | ORAL_TABLET | Freq: Three times a day (TID) | ORAL | Status: DC | PRN
Start: 2015-07-28 — End: 2016-03-04

## 2015-07-28 MED ORDER — MECLIZINE HCL 25 MG OR TABS
50.0000 mg | ORAL_TABLET | Freq: Two times a day (BID) | ORAL | Status: DC | PRN
Start: 2015-07-28 — End: 2017-08-25

## 2015-07-28 NOTE — Progress Notes (Signed)
Reviewed eCare status with Patient:  YES    HEALTH MAINTENANCE:  Has the patient had any of these since their last visit?    Cervical screening/PAP: Not Due     Mammo: Not Due    Colon Screen: Due - Referral and/or FIT Kit Pending    Diabetic Eye Exam: Due Referral Pended      Have you seen a specialist since your last visit: No        HM Due:   Health Maintenance   Topic Date Due   . Diabetes Eye Exam  04/24/1982   . Influenza Vaccine (1) 02/23/2015   . Colon Cancer Screen w/ FOBT/FIT  07/01/2015   . A1c  10/09/2015   . Diabetes Foot Exam  06/06/2016   . Diabetes Screen  04/09/2018   . Cholesterol Test  04/03/2020   . Tetanus Vaccine  06/25/2024   . Hepatitis C Screen  Addressed   . HIV Screen  Addressed           Future Appointments  Date Time Provider [place]   07/28/2015 11:00 AM Bjorn Loser, Lorri Frederick, ARNP [facility]

## 2015-07-28 NOTE — Progress Notes (Signed)
CC: Wednesday until now he has had vomiting and standing up causes dizziness. No appetite, 3-4 pieces of toast only. He is hydrated. No pain  No gall bladder.  He has diarrhea 2-3 hours yesterday about half the day . Resolving.   No fever. Fatigue. No stomach pain.     He has hx of BPV and he is mildly dizzy now. No URI symptoms. No popping of the ear or change in hearing. 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   Family: Both parents have had diabetes and HTN  Review of Systems -   Denies fever or night sweats at this time.  He has ongoing mild sinus tenderness and congestion  He denies cough  No chest pain  No difficulty urinating and no low back pain    Patient Active Problem List   Diagnosis   . Depressive disorder, not elsewhere classified   . Allergic rhinitis, cause unspecified   . Ankylosing spondylitis ([place])   . Chronic pain   . Hypertension   . Hypothyroidism   . Foot pain   . Other acquired absence of organ   . Other postprocedural status(V45.89)   . Unspecified sleep apnea   . Asthma   . Cough   . Allergic rhinitis due to allergen   . Restless legs syndrome   . Anterior corneal dystrophy   . Sleep apnea   . Acute vestibular neuritis   . Hypertriglyceridemia     Outpatient Prescriptions Prior to Visit   Medication Sig Dispense Refill   . Albuterol Sulfate (2.5 MG/3ML) 0.083% Inhalation Nebu Soln Inhale 3 mL (2.5 mg) via nebulizer every 6 hours as needed for shortness of breath/wheezing. 100 vial 1   . Atorvastatin Calcium 20 MG Oral Tab Take 1 tablet (20 mg) by mouth daily. To lower Cholesterol. 90 tablet 1   . Benzonatate 200 MG Oral Cap Take 1 capsule (200 mg) by mouth 3 times a day as needed for cough. 30 capsule 0   . Budesonide 0.5 MG/2ML Inhalation Suspension Inhale 2 mL (0.5 mg) via nebulizer every 12 hours. 120 mL 1   . Certolizumab Pegol (CIMZIA PREFILLED) 2 X 200 MG/ML Subcutaneous Kit Inject 200 mg under the skin every 2 weeks.     . Clotrimazole-Betamethasone 1-0.05 % External Cream Apply 1  application topically 2 times a day as needed (rash). Apply to buttocks 45 g 2   . DULoxetine HCl 60 MG Oral CAPSULE ENTERIC COATED PARTICLES Take 1 capsule (60 mg) by mouth daily. 90 capsule 3   . Esomeprazole Magnesium 40 MG Oral CAPSULE DELAYED RELEASE Take 1 capsule (40 mg) by mouth 2 times a day. Take on empty stomach 180 capsule 1   . Fexofenadine-Pseudoephed ER 60-120 MG Oral TABLET SR 12 HR Take 1 tablet by mouth every 12 hours as needed for allergies. 30 tablet 1   . Fluticasone Propionate 50 MCG/ACT Nasal Suspension Spray 1 spray into each nostril 2 times a day. 1 bottle 3   . Ipratropium-Albuterol 20-100 MCG/ACT Inhalation Aero Soln Inhale 2 puffs by mouth every 4 hours as needed (cough). 1 Inhaler 11   . LamoTRIgine 100 MG Oral Tab Week 5: 100 mg once daily; Week 6 and maintenance: 200 mg once daily. 180 tablet 2   . LamoTRIgine 25 MG Oral Tab Weeks 1 and 2: 25 mg once daily; Weeks 3 and 4: 50 mg once daily; 45 tablet 0   . Levothyroxine Sodium 88 MCG Oral Tab Take 1 tablet (88  mcg) by mouth daily on an empty stomach. 90 tablet 2   . Losartan Potassium 50 MG Oral Tab One tablet twice daily 60 tablet 1   . Meclizine HCl 25 MG Oral Tab Take 2 tablets (50 mg) by mouth 2 times a day as needed for dizziness or nausea/vomiting. 60 tablet 0   . Meloxicam 15 MG Oral Tab Take 1 tablet (15 mg) by mouth daily. 90 tablet 2   . MetFORMIN HCl ER 500 MG Oral TABLET SR 24 HR Take 1 tablet (500 mg) by mouth daily. 30 tablet 3   . MethylPREDNISolone 4 MG Oral Tab Six tablets today, then one tablet less each day until gone. 21 tablet 0   . Montelukast Sodium 10 MG Oral Tab take 1 tablet by mouth every evening 90 tablet 3   . Olopatadine HCl 0.2 % Ophthalmic Solution Place 1 drop in each EYE daily. 2.5 mL 2   . Olopatadine HCl 0.6 % Nasal Solution Spray 2 sprays into each nostril 2 times a day. 1 bottle 11   . Ondansetron 8 MG Oral TABLET DISPERSIBLE Dissolve 1 tablet (8 mg) on top of tongue and swallow every 8 hours as  needed for nausea/vomiting. 60 tablet 1   . Pregabalin (LYRICA) 75 MG Oral Cap Three tablets at dinner and three tablets at bedtime 180 capsule 2   . Respiratory Therapy Supplies (NEBULIZER) Does not apply Device Use every 6 hours as needed for other (chronic bronchitis). Chronic bronchitis 1 Device 0   . ROPINIRole HCl 0.25 MG Oral Tab By mouth, 1 tab (0.25 mg) once daily 1 to 3 hours before bedtime & in 2 days increase to  0.5 mg daily (2 tabs), and after 7 days to 1 mg daily. Dose may be further titrated upward in 0.5 mg increments every week until reaching a daily dose of 3 mg during week 6. Daily dose may be increased to a maximum of 4 mg beginning week 7. 120 tablet 2   . Spacer/Aero-Holding Chambers (AEROCHAMBER Z-STAT PLUS) Misc Use with inhaler as directed. 1 Device 1     No facility-administered medications prior to visit.     IMPRESSION:    Most likely simple viral gastritis  (Z12.9) Screening for cancer  (primary encounter diagnosis)  Plan: OCCULT BLOOD BY IA, STL     (Z13.5) Screening for eye condition  Plan: REFERRAL TO EYE CARE   Due related to diabetes    (E11.9) Type 2 diabetes mellitus without complication, without long-term current use of insulin (HCC)  Plan: REFERRAL TO EYE CARE         (R42) Vertigo  Plan: Meclizine HCl 25 MG Oral Tab      refill    (R11.0) Nausea  Plan: provided medication and home care instructions    (K29.00) Acute gastritis without hemorrhage, unspecified gastritis type  Plan: Ondansetron 8 MG Oral TABLET DISPERSIBLE        (Z91.09) Environmental allergies  Plan: REFERRAL TO ALLERGY/IMMUNOLOGY

## 2015-07-28 NOTE — Patient Instructions (Addendum)
It was a pleasure to see you in clinic today.                If you are not yet signed up for eCare, please see the below eCare section at the end of this document for how to enroll and to get your access codes.  It's easy to sign up at home today.    eCare  enrollment will allow you access to the below benefits   You can make appointments online   View test results / Lab Results   Request prescription renewals   Obtain a copy of our After Visit Summary (an electronic copy of this document)     Your Test Results:  If labs were ordered today the results are expected to be available via eCare in about 5 days. If you have an active eCare account, this is how we will notify you of your results.     If you do not have an eCare account then your test results will be mailed to you within about 14 days after your tests are completed. If your physician needs to change your care based on your results or is concerned, you should expect a phone call or eCare message from your provider.    If you have any questions about your test results please schedule an appointment with your provider, so that we may review them with you in greater detail.    **If it has been more than 2 weeks and you have not received your test results please send our office a message via eCare.    Medication Refills: If you need a prescription refilled, please contact your pharmacy 1 week before your current supply will run out to request the refill.  Contacting your pharmacy is the fastest and safest way to obtain a medication refill.  The pharmacy will notify our office.  Please note, that a minimum of 48 to 72 hours is needed to refill a medication,  Please call your pharmacy early to allow enough time to refill before you anticipate running out.  For faster medication refills, you can also schedule an appointment with your provider.    We know you have a choice in where you receive your healthcare and we sincerely thank you for trusting [facility] with your health.    Gastritis (Adult)    Gastritis isinflammation andirritation of the stomach lining.It can be present for a short time (acute) or be long lasting (chronic). Gastritis is often caused by infection with bacteria calledH pylori.More than a third of people in the [place] have this bacteria in their bodies. In many cases,H pyloricauses no problems or symptoms. In some people, though, the infection irritates the stomach lining and causes gastritis. Other causes of stomach irritation include drinking alcohol or taking pain-relieving medicines called NSAIDs (such as aspirin or ibuprofen).  Symptoms of gastritis can include:   Abdominal pain or bloating   Loss of appetite   Nausea or vomiting   Vomiting blood or having black stools   Feeling more tired than usual  An inflamed and irritated stomach lining is more likely to develop a sore called an ulcer. To help prevent this, gastritis should be treated.  Home care  If needed, medicines may be prescribed. If you haveH pyloriinfection, treating it will likely relieve your symptoms. Other changes can help reduce stomach irritation and help it heal.   If you have been prescribed medicines forH pyloriinfection, take them as directed.  Take all of the medicine until it is finished or your healthcare provider tells you to stop, even if you feel better.   Your healthcare provider may recommend avoiding NSAIDs. If you take daily aspirin for your heart or other medical reasons, do not stop without talking to your healthcare provider first.   Avoid drinking alcohol.   Stop smoking. Smoking can irritate the stomach and delay healing. As much as possible, stay away from second hand smoke.  Follow-up care  Follow up with your healthcare provider, or as advised by our staff. Testing may be needed to check for inflammation or an ulcer.  When to seek medical advice  Call your healthcare provider for any of the  following:   Stomach pain that gets worse or moves to the lower right abdomen (appendix area)   Chest pain that appears or gets worse, or spreads to the back, neck, shoulder, or arm   Frequent vomiting (can't keep down liquids)   Blood in the stool or vomit (red or black in color)   Feeling weak or dizzy   Fever of 100.5F (38C) or higher, or as directed by your healthcare provider   [phone] [employer], [facility]. [address] [place], [place] [address]. All rights reserved. This information is not intended as a substitute for professional medical care. Always follow your healthcare professional's instructions.

## 2015-07-31 ENCOUNTER — Ambulatory Visit (INDEPENDENT_AMBULATORY_CARE_PROVIDER_SITE_OTHER): Payer: BLUE CROSS/BLUE SHIELD

## 2015-08-01 ENCOUNTER — Encounter (INDEPENDENT_AMBULATORY_CARE_PROVIDER_SITE_OTHER): Payer: Self-pay | Admitting: Family

## 2015-08-02 ENCOUNTER — Encounter (INDEPENDENT_AMBULATORY_CARE_PROVIDER_SITE_OTHER): Payer: Self-pay | Admitting: Family

## 2015-08-02 ENCOUNTER — Ambulatory Visit (INDEPENDENT_AMBULATORY_CARE_PROVIDER_SITE_OTHER): Payer: BLUE CROSS/BLUE SHIELD | Admitting: Family

## 2015-08-02 VITALS — BP 164/97 | HR 92 | Temp 98.0°F | Wt 255.2 lb

## 2015-08-02 DIAGNOSIS — H8112 Benign paroxysmal vertigo, left ear: Secondary | ICD-10-CM

## 2015-08-02 MED ORDER — CLONAZEPAM 0.5 MG OR TABS
ORAL_TABLET | ORAL | Status: DC
Start: 2015-08-02 — End: 2017-08-25

## 2015-08-02 NOTE — Patient Instructions (Addendum)
It was a pleasure to see you in clinic today.               Start the [place] D twice daily.   Continue Montelukast    [facility] (Dr Veatrice Bourbon) Chiropractor  And /or   [facility]ara [facility] RET PT    Start with 20 minutes each day of stretching. You are the new GUMBY. Work towards 40 minutes per day .  Walking dog sounds terrific. He will like this.  Add weights      Resources:  1. The very best way to avoid damage to your liver and heart is to loose the weight. We do have a weight management program in [place] through [facility]. They can provide information in detail about nutrition and if appropriate surgery and medication. Let me know if  You want a referral.   2. There is an excellent new book on weight and cholesterol management. The genetic strides in the last few years are amazing. Author - Lacey Jensen, MD  "The Change Your Biology Diet" . This book provides evidence based information on the latest discoveries in weight management. The recommendations are sustainable.  In this book there is new research regarding probiotics. We do not know how much or exactly what to recommend yet but pill form "ALIGN" is a reasonable choice or daily consumption of greek yogurt or kombucha tea.  3. Sleep apnea is a common family trait. I strongly suggest you consider a sleep study. Do you want a referral?  4. There is not enough evidence to suggest any relationship between low sodium and cholesterol levels. We always recommend a low salt intake of 2.3 g per 24 hrs for healthy living.   5. The following are ways to increase your metabolism which is the key to reducing the fat storage of high cholesterol.     A) Eat small frequent Meals throughout the day.  Extending the time between meals makes your body go into 'starvation mode' which decreases your metabolism as a means to conserve energy and prevent starvation.  While some people are able to lose weight through intermittent fasting, most people generally eat less overall when they  eat small, frequent meals.  In addition to having four to six small meals per day eating healthy snacks will also increase metabolism.  B) Choose lean proteins. Eating a diet rich in lean proteins will increase your metabolism because it takes more energy for your body to digest the protein.  Examples of lean protein are [place], fish, eggs, and beans. Obviously avoid foods high in trans-fats or high fat food. Check ALL labels and avoid all processed and fat foods.   C) Add spice to your favorite foods.  Spicy peppers, cayenne pepper, turmeric can increase metabolism by as much as 8% when eaten with the RIGHT foods.  D) Get at least 30 minutes of aerobic exercise daily.  Breaking it up into 5 or 10 minute intervals also works. Always look for ways to get a little more exercise into your daily activities. For example, take the stairs, stand up at work instead of sitting.  E) Add strength training to your exercise program.  Muscle burns more calories than fat. Having more muscle is truly the only way to increase your resting metabolic rate which accounts for 60 to 70 percent of the calories your burn daily.   F) Drink plenty of water.  You can increase your metabolic rate by as much as 40% by simply  drinking enough water.  Most people need 48-64 oz per day.  G) Drink coffee and / or green tea.  Recent research indicates green tea extract, even decaffeinated, can increase metabolism significantly.   H) Be aware that crash diets can ultimately slow down your metabolism.  I ) Calculate how many calories you need to support your current and ideal weight  There are calculators on line available to do this.  J) GOOD FATS matter.  Fill your diet with unsaturated vegetable oils (e.g. sunflower oil), unsalted nuts, and fish.  You will reduce your craving for simple sugars and carbs such as candy, ice cream, pasta and white flour products)!  I really like hemp oil as it seems to work well for many of my clients.  K) HORMONES  matter.  As we age, white fat cells (these are the pesky ones) expand especially around the midsection and the drop in hormones activates certain proteins in the body that promote fat storage there.  This is also true of cellulite.    L) While there is no one solution to this problem, we do know that visceral fat does respond to aerobic exercise better than strength training, especially if interval training methods are incorporated into the program.  M) Sleep! Yes- to increase your metabolism you need to sleep WELL at least 7 hours nightly. If you are having difficulty with sleep, address this with your provider as you may need a comprehensive evaluation to determine the underlying cause.  It is very important to review your approach to sleep as well.   N) Obviously smoking, excessive alcohol or cannabis will reduce your metabolism.  J ) DHEA? There is a lot of buzz.  It is used to slow or reverse aging, improve thinking skills, increasing muscle mass, strength and energy.  25-50 mg daily is recommended given as a single dose for most adults.      Finally, there are several weight reduction prescriptions now available. Most insurance companies will not cover these but at least two are not 'that' expensive.Tone N complex by [employer] has a very successful starter kit for weight loss but be mindful that you will gain all of the weight back if you do not make the lifestyle changes long term.    I hope this information is helpful to you.               If you are not yet signed up for eCare, please see the below eCare section at the end of this document for how to enroll and to get your access codes.  It's easy to sign up at home today.    eCare  enrollment will allow you access to the below benefits   You can make appointments online   View test results / Lab Results   Request prescription renewals   Obtain a copy of our After Visit Summary (an electronic copy of this document)     Your Test  Results:  If labs were ordered today the results are expected to be available via eCare in about 5 days. If you have an active eCare account, this is how we will notify you of your results.     If you do not have an eCare account then your test results will be mailed to you within about 14 days after your tests are completed. If your physician needs to change your care based on your results or is concerned, you should expect a phone call  or eCare message from your provider.    If you have any questions about your test results please schedule an appointment with your provider, so that we may review them with you in greater detail.    **If it has been more than 2 weeks and you have not received your test results please send our office a message via [place].    Medication Refills: If you need a prescription refilled, please contact your pharmacy 1 week before your current supply will run out to request the refill.  Contacting your pharmacy is the fastest and safest way to obtain a medication refill.  The pharmacy will notify our office.  Please note, that a minimum of 48 to 72 hours is needed to refill a medication,  Please call your pharmacy early to allow enough time to refill before you anticipate running out.  For faster medication refills, you can also schedule an appointment with your provider.    We know you have a choice in where you receive your healthcare and we sincerely thank you for trusting [facility] with your health.

## 2015-08-02 NOTE — Progress Notes (Signed)
Reviewed eCare status with Patient:  YES    HEALTH MAINTENANCE:  Has the patient had any of these since their last visit?    Cervical screening/PAP: Not Due     Mammo: Not Due    Colon Screen: Due - Referral and/or FIT Kit Pending    Diabetic Eye Exam: Due Referral Pended      Have you seen a specialist since your last visit: No        HM Due:   Health Maintenance   Topic Date Due   . Diabetes Eye Exam  04/24/1982   . Influenza Vaccine (1) 02/23/2015   . Colon Cancer Screen w/ FOBT/FIT  07/01/2015   . A1c  10/09/2015   . Diabetes Foot Exam  06/06/2016   . Diabetes Screen  04/09/2018   . Cholesterol Test  04/03/2020   . Tetanus Vaccine  06/25/2024   . Hepatitis C Screen  Addressed   . HIV Screen  Addressed           Future Appointments  Date Time Provider [place]   08/02/2015 12:00 PM Rocky Crafts, ARNP [facility]   08/28/2015 11:00 AM Bjorn Loser Lorri Frederick, ARNP [facility] NIS[facility]

## 2015-08-02 NOTE — Telephone Encounter (Signed)
Please let patient know I scheduled him for appt at [facility] today.   Thank you

## 2015-08-02 NOTE — Telephone Encounter (Signed)
Anthony Andrade, please see above ecare message.     Please advise.     Routing to Anthony Andrade.

## 2015-08-02 NOTE — Progress Notes (Signed)
CC:  He is dizzy and nauseated. This has been going for about week. A little better than a few days ago. Heading back to work tomorrow.   He feels it as soon as he wakes up.  He will sit there for a few minutes and waits for it to calm down. He will vomit and has done so throughout the day. Up to about 3 x.   Does not see a pattern.  The meclizine really helps. He has not done the exercises. He use to go to the chiropractor but it was too painful. The [place] unit really bothered him.      ROS:  Constitution: Cpap is heard to sleep with most of the time.   HEENT: Denies blurred vision no double vision, no difficulty swallowing  Lungs: No shortness of breath, no wheezing, continues to have cough worse in the morning  Heart: denies chest pain although once and awhile there is a sharp pain  GI: Vomited x 3 but not in several days  Neuro: No weakness in face, Denies any numbness or tingling, no headaches other than his allergy symptoms  Skin: no rashes, no itching      Patient Active Problem List   Diagnosis   . Depressive disorder, not elsewhere classified   . Allergic rhinitis, cause unspecified   . Ankylosing spondylitis ([place])   . Chronic pain   . Hypertension   . Hypothyroidism   . Foot pain   . Other acquired absence of organ   . Other postprocedural status(V45.89)   . Unspecified sleep apnea   . Asthma   . Cough   . Allergic rhinitis due to allergen   . Restless legs syndrome   . Anterior corneal dystrophy   . Sleep apnea   . Acute vestibular neuritis   . Hypertriglyceridemia       Outpatient Prescriptions Prior to Visit   Medication Sig Dispense Refill   . Albuterol Sulfate (2.5 MG/3ML) 0.083% Inhalation Nebu Soln Inhale 3 mL (2.5 mg) via nebulizer every 6 hours as needed for shortness of breath/wheezing. 100 vial 1   . Atorvastatin Calcium 20 MG Oral Tab Take 1 tablet (20 mg) by mouth daily. To lower Cholesterol. 90 tablet 1   . Benzonatate 200 MG Oral Cap Take 1 capsule (200 mg) by mouth 3 times a day as needed  for cough. 30 capsule 0   . Budesonide 0.5 MG/2ML Inhalation Suspension Inhale 2 mL (0.5 mg) via nebulizer every 12 hours. 120 mL 1   . Certolizumab Pegol (CIMZIA PREFILLED) 2 X 200 MG/ML Subcutaneous Kit Inject 200 mg under the skin every 2 weeks.     . Clotrimazole-Betamethasone 1-0.05 % External Cream Apply 1 application topically 2 times a day as needed (rash). Apply to buttocks 45 g 2   . DULoxetine HCl 60 MG Oral CAPSULE ENTERIC COATED PARTICLES Take 1 capsule (60 mg) by mouth daily. 90 capsule 3   . Esomeprazole Magnesium 40 MG Oral CAPSULE DELAYED RELEASE Take 1 capsule (40 mg) by mouth 2 times a day. Take on empty stomach 180 capsule 1   . Fexofenadine-Pseudoephed ER 60-120 MG Oral TABLET SR 12 HR Take 1 tablet by mouth every 12 hours as needed for allergies. 30 tablet 1   . Fluticasone Propionate 50 MCG/ACT Nasal Suspension Spray 1 spray into each nostril 2 times a day. 1 bottle 3   . Ipratropium-Albuterol 20-100 MCG/ACT Inhalation Aero Soln Inhale 2 puffs by mouth every 4 hours as needed (  cough). 1 Inhaler 11   . LamoTRIgine 100 MG Oral Tab Week 5: 100 mg once daily; Week 6 and maintenance: 200 mg once daily. 180 tablet 2   . LamoTRIgine 25 MG Oral Tab Weeks 1 and 2: 25 mg once daily; Weeks 3 and 4: 50 mg once daily; 45 tablet 0   . Levothyroxine Sodium 88 MCG Oral Tab Take 1 tablet (88 mcg) by mouth daily on an empty stomach. 90 tablet 2   . Losartan Potassium 50 MG Oral Tab One tablet twice daily 60 tablet 1   . Meclizine HCl 25 MG Oral Tab Take 2 tablets (50 mg) by mouth 2 times a day as needed for dizziness or nausea/vomiting. 60 tablet 0   . Meloxicam 15 MG Oral Tab Take 1 tablet (15 mg) by mouth daily. 90 tablet 2   . MetFORMIN HCl ER 500 MG Oral TABLET SR 24 HR Take 1 tablet (500 mg) by mouth daily. 30 tablet 3   . MethylPREDNISolone 4 MG Oral Tab Six tablets today, then one tablet less each day until gone. 21 tablet 0   . Montelukast Sodium 10 MG Oral Tab take 1 tablet by mouth every evening 90  tablet 3   . Olopatadine HCl 0.2 % Ophthalmic Solution Place 1 drop in each EYE daily. 2.5 mL 2   . Olopatadine HCl 0.6 % Nasal Solution Spray 2 sprays into each nostril 2 times a day. 1 bottle 11   . Ondansetron 8 MG Oral TABLET DISPERSIBLE Dissolve 1 tablet (8 mg) on top of tongue and swallow every 8 hours as needed for nausea/vomiting. 60 tablet 1   . Pregabalin (LYRICA) 75 MG Oral Cap Three tablets at dinner and three tablets at bedtime 180 capsule 2   . Respiratory Therapy Supplies (NEBULIZER) Does not apply Device Use every 6 hours as needed for other (chronic bronchitis). Chronic bronchitis 1 Device 0   . ROPINIRole HCl 0.25 MG Oral Tab By mouth, 1 tab (0.25 mg) once daily 1 to 3 hours before bedtime & in 2 days increase to  0.5 mg daily (2 tabs), and after 7 days to 1 mg daily. Dose may be further titrated upward in 0.5 mg increments every week until reaching a daily dose of 3 mg during week 6. Daily dose may be increased to a maximum of 4 mg beginning week 7. 120 tablet 2   . Spacer/Aero-Holding Chambers (AEROCHAMBER Z-STAT PLUS) Misc Use with inhaler as directed. 1 Device 1     No facility-administered medications prior to visit.     Objective    PHYSICAL EXAM:  General: healthy, alert, relaxed, flushed  Skin: negatives: color normal, no lesions noted, temperature normal, mobility and turgor normal  Head: Normocephalic. No masses, lesions, tenderness or abnormalities  Eyes: Lids/periorbital skin normal, Conjunctivae/corneas clear, PERRL, EOM's intact  Ears: positive findings: R TM - external ear, canal and TM normal and air/fluid interface visualized, L TM - external ear, canal and TM normal  Nose:normal, clear secretions in nose  Oropharynx: Lips, mucosa, and tongue normal. Teeth and gums normal., posterior pharynx without erythema or drainage  Neck: supple. No adenopathy. Thyroid symmetric, normal size, without nodules  Lungs: clear to auscultation  Heart: normal rate, regular rhythm and no murmurs,  clicks, or gallops    IMPRESSION    (H81.12) BPV (benign positional vertigo), left  (primary encounter diagnosis)  Plan: ClonazePAM 0.5 MG Oral Tab       Start Clonazepam to reduce anxiety  and help reduce difficulty sleeping.  Follow up in 1 week  Completed FMLA paper work  Continue medications for depression                            BPV:  1) Either see Dr Joslyn Hy or RET [place]  For BPV  2) Take the [place] D twice daily   3) Drinking increase fluids

## 2015-08-03 ENCOUNTER — Encounter (INDEPENDENT_AMBULATORY_CARE_PROVIDER_SITE_OTHER): Payer: Self-pay | Admitting: Family

## 2015-08-04 NOTE — Telephone Encounter (Signed)
Last Office Visit Date (relevant to concern):  08/02/2015  Last provider seen for above Office Visit: Nelva Bush  Copy of plan from last relevant visit:  Notes not completed  Some notes are listed.    CC: He is dizzy and nauseated.   He feels it as soon as he wakes up. He will sit there for a few minutes and waits for it to calm down. He will vomit and has done so throughout the day. Up to about 3 x.   Does not see a pattern. The meclizine really helps. He has not done the exercises. He use to go to the chiropractor but it was too painful. The [place] unit really bothered him.     CC:                                       BPV:  1) Either see Dr Joslyn Hy or RET [facility] For BPV  2) Take the [place] D twice daily   3) Drinking increase fluids    Short summary of question/request for provider:  Patient reports feeling worse, dizziness has been bad and patient fell. Patient has an appointment with a chiropractor on Monday. Pt is requesting to have additional FMLA paperwork filled out.     Please route to provider who last saw patient for this concern or prescription.

## 2015-08-08 ENCOUNTER — Encounter (INDEPENDENT_AMBULATORY_CARE_PROVIDER_SITE_OTHER): Payer: Self-pay | Admitting: Family

## 2015-08-09 ENCOUNTER — Ambulatory Visit (INDEPENDENT_AMBULATORY_CARE_PROVIDER_SITE_OTHER): Payer: BLUE CROSS/BLUE SHIELD | Admitting: Family

## 2015-08-09 ENCOUNTER — Telehealth (INDEPENDENT_AMBULATORY_CARE_PROVIDER_SITE_OTHER): Payer: Self-pay | Admitting: Family

## 2015-08-09 ENCOUNTER — Encounter (INDEPENDENT_AMBULATORY_CARE_PROVIDER_SITE_OTHER): Payer: Self-pay | Admitting: Family

## 2015-08-09 VITALS — BP 149/103 | HR 91 | Temp 98.1°F | Resp 12 | Ht 70.0 in | Wt 255.0 lb

## 2015-08-09 DIAGNOSIS — Z9109 Other allergy status, other than to drugs and biological substances: Secondary | ICD-10-CM

## 2015-08-09 DIAGNOSIS — Z1211 Encounter for screening for malignant neoplasm of colon: Secondary | ICD-10-CM

## 2015-08-09 DIAGNOSIS — J4532 Mild persistent asthma with status asthmaticus: Secondary | ICD-10-CM

## 2015-08-09 DIAGNOSIS — E1165 Type 2 diabetes mellitus with hyperglycemia: Secondary | ICD-10-CM

## 2015-08-09 DIAGNOSIS — E039 Hypothyroidism, unspecified: Secondary | ICD-10-CM

## 2015-08-09 DIAGNOSIS — J411 Mucopurulent chronic bronchitis: Secondary | ICD-10-CM

## 2015-08-09 DIAGNOSIS — I10 Essential (primary) hypertension: Secondary | ICD-10-CM

## 2015-08-09 DIAGNOSIS — G8929 Other chronic pain: Secondary | ICD-10-CM

## 2015-08-09 DIAGNOSIS — H8110 Benign paroxysmal vertigo, unspecified ear: Secondary | ICD-10-CM

## 2015-08-09 LAB — PR A1C RAPID, ONSITE: Hemoglobin A1C: 7.7 % — ABNORMAL HIGH (ref 4.0–6.0)

## 2015-08-09 LAB — COMPREHENSIVE METABOLIC PANEL
ALT (GPT): 83 U/L — ABNORMAL HIGH (ref 10–48)
AST (GOT): 43 U/L — ABNORMAL HIGH (ref 9–38)
Albumin: 4.1 g/dL (ref 3.5–5.2)
Alkaline Phosphatase (Total): 118 U/L (ref 39–139)
Anion Gap: 11 (ref 4–12)
Bilirubin (Total): 0.5 mg/dL (ref 0.2–1.3)
Calcium: 9.1 mg/dL (ref 8.9–10.2)
Carbon Dioxide, Total: 28 meq/L (ref 22–32)
Chloride: 100 meq/L (ref 98–108)
Creatinine: 0.9 mg/dL (ref 0.51–1.18)
GFR, Calc, African American: >60 mL/min/{1.73_m2}
GFR, Calc, European American: >60 mL/min/{1.73_m2}
Glucose: 169 mg/dL — ABNORMAL HIGH (ref 62–125)
Potassium: 4.2 meq/L (ref 3.6–5.2)
Protein (Total): 6.7 g/dL (ref 6.0–8.2)
Sodium: 139 meq/L (ref 135–145)
Urea Nitrogen: 12 mg/dL (ref 8–21)

## 2015-08-09 LAB — ALBUMIN/CREATININE RATIO, RANDOM URINE
Albumin (Micro), URN: <0.7 mg/dL
Albumin/Creatinine Ratio, URN: <3 mg/g{creat} (ref <30)
Creatinine/Unit, URN: 251 mg/dL

## 2015-08-09 LAB — TSH WITH REFLEXIVE FREE T4: TSH with Reflexive Free T4: 2.002 u[IU]/mL (ref 0.400–5.000)

## 2015-08-09 MED ORDER — IPRATROPIUM-ALBUTEROL 20-100 MCG/ACT IN AERS
2.0000 | INHALATION_SPRAY | RESPIRATORY_TRACT | Status: DC | PRN
Start: 2015-08-09 — End: 2016-07-24

## 2015-08-09 MED ORDER — PREGABALIN 225 MG OR CAPS
ORAL_CAPSULE | ORAL | Status: AC
Start: 2015-08-09 — End: ?

## 2015-08-09 MED ORDER — AMLODIPINE BESYLATE 2.5 MG OR TABS
2.5000 mg | ORAL_TABLET | Freq: Every day | ORAL | Status: DC
Start: 2015-08-09 — End: 2015-09-04

## 2015-08-09 MED ORDER — FLUTICASONE PROPIONATE 50 MCG/ACT NA SUSP
1.0000 | Freq: Two times a day (BID) | NASAL | Status: DC
Start: 2015-08-09 — End: 2017-08-25

## 2015-08-09 NOTE — Telephone Encounter (Signed)
Received a Short Term Disability  form via fax. Form [place] in Kerry Meyer's in box to complete    Main contact for form if there are questions/concerns: [employer]  Phone #: [phone]  Fax # [phone]  Please route back to the [employer]

## 2015-08-09 NOTE — Telephone Encounter (Signed)
Form was completed during clinic visit and Faxed to [facility]

## 2015-08-09 NOTE — Progress Notes (Signed)
Reviewed eCare status with Patient:  YES    HEALTH MAINTENANCE:  Has the patient had any of these since their last visit?    Cervical screening/PAP: Not Due     Mammo: Not Due    Colon Screen: Due - Referral and/or FIT Kit Pending    Diabetic Eye Exam: Patient has referral       Have you seen a specialist since your last visit: No        HM Due:   Health Maintenance   Topic Date Due   . Diabetes Eye Exam  04/24/1982   . Influenza Vaccine (1) 02/23/2015   . Colon Cancer Screen w/ FOBT/FIT  07/01/2015   . A1c  10/09/2015   . Diabetes Foot Exam  06/06/2016   . Diabetes Screen  04/09/2018   . Cholesterol Test  04/03/2020   . Tetanus Vaccine  06/25/2024   . Hepatitis C Screen  Addressed   . HIV Screen  Addressed           Future Appointments  Date Time Provider [place]   08/09/2015 11:00 AM Rocky Crafts, ARNP [facility]   08/28/2015 11:00 AM Rocky Crafts, ARNP [facility]   09/07/2015 9:00 AM Ricard Dillon, Beverly Gust, [facility]   09/19/2015 9:30 AM Ayars, Dorathy Kinsman, MD Renard Matter

## 2015-08-09 NOTE — Patient Instructions (Addendum)
Referrals to [place] [facility], and [facility]  It was a pleasure to see you in clinic today.                If you are not yet signed up for eCare, please see the below eCare section at the end of this document for how to enroll and to get your access codes.  It's easy to sign up at home today.    eCare  enrollment will allow you access to the below benefits   You can make appointments online   View test results / Lab Results   Request prescription renewals   Obtain a copy of our After Visit Summary (an electronic copy of this document)     Your Test Results:  If labs were ordered today the results are expected to be available via eCare in about 5 days. If you have an active eCare account, this is how we will notify you of your results.     If you do not have an eCare account then your test results will be mailed to you within about 14 days after your tests are completed. If your physician needs to change your care based on your results or is concerned, you should expect a phone call or eCare message from your provider.    If you have any questions about your test results please schedule an appointment with your provider, so that we may review them with you in greater detail.    **If it has been more than 2 weeks and you have not received your test results please send our office a message via eCare.    Medication Refills: If you need a prescription refilled, please contact your pharmacy 1 week before your current supply will run out to request the refill.  Contacting your pharmacy is the fastest and safest way to obtain a medication refill.  The pharmacy will notify our office.  Please note, that a minimum of 48 to 72 hours is needed to refill a medication,  Please call your pharmacy early to allow enough time to refill before you anticipate running out.  For faster medication refills, you can also schedule an appointment with your provider.    We know you have a choice in where you receive your healthcare and we  sincerely thank you for trusting [facility] with your health.

## 2015-08-10 ENCOUNTER — Telehealth (INDEPENDENT_AMBULATORY_CARE_PROVIDER_SITE_OTHER): Payer: Self-pay | Admitting: Family

## 2015-08-10 NOTE — Telephone Encounter (Signed)
Routing to the referral pool.

## 2015-08-10 NOTE — Telephone Encounter (Signed)
(  TEXTING IS AN OPTION FOR [facility] CLINICS ONLY)  Is this a [facility] clinic? Yes      RETURN CALL: Detailed message on voicemail only      SUBJECT:  Referral Request      REFERRING PROVIDER: ARNP [name]   SYMPTOM(S)/DIAGNOSIS: Vertigo     CLINIC AND LOCATION: [facility]  In [facility]   NAME OF PROVIDER: unknown   TYPE OF SPECIALTY: Physical Therapy   CLINIC PHONE: [phone] CLINIC FAX: [phone]  HAS THE REFERRAL ALREADY BEEN REQUESTED?: Yes   ADDITIONAL INFORMATION: Pt is being treated today so caller is requesting the above referral be faxed today

## 2015-08-10 NOTE — Telephone Encounter (Signed)
(  TEXTING IS AN OPTION FOR [facility] CLINICS ONLY)  Is this a [facility] clinic? yes      RETURN CALL: Detailed message on voicemail only      SUBJECT:  Form/Letter/Paperwork Request     REASON FOR REQUEST: Clarify forms that were faxed yesterday: Attending Physician Statement( aka Short Term Disability form)      FORM/LETTER/PAPERWORK IS FOR: Other: Out of work and Disability   WHO SHOULD FILL IT OUT?: Daphane Shepherd, Delcie Roch, ARNP  WILL PATIENT PICK UP?: No Fax to [phone]  NEEDED BY: within 24 hours  ADDITIONAL INFORMATION: Arnella from [facility] called to confirm if forms the Attending Physician Statement( aka Short Term Disability form) was received. She said she did not receive anything back. She also said she did however received the Healthcare Provider form but not the Attending Physician Statement( aka Short Term Disability form). Please Advise. Thank you

## 2015-08-10 NOTE — Telephone Encounter (Signed)
Nash Dimmer- I see the Healthcare provider form filled and faxed. Per Arnella from [facility] they also needed the Attending physician statement (Short term disability form) . Did you receive this form as well?     Tried calling Arnella to confirm with no answer.     Routing to [employer].

## 2015-08-10 NOTE — Telephone Encounter (Signed)
PT referral was in system from 10/2014,. Good for one year     Faxed to [facility] PT attn [place]

## 2015-08-11 NOTE — Telephone Encounter (Signed)
Called [employer] at [employer] above and the number is no longer in service.     Called pt to try and get the correct phone number for [facility].    LVM for pt to call back.    CCR:  If patient returns phone call, please transfer call to an [place] medical assistant at extension [phone]. If no one is available to answer, please take a message and forward the TE to the [facility].

## 2015-08-11 NOTE — Telephone Encounter (Signed)
I thought this was duplicate. I do not have this form. Could someone please request a copy and I will complete as soon as possible.    [name], Blanchard Kelch, PhD

## 2015-08-12 NOTE — Progress Notes (Signed)
Subjective:  Anthony Andrade is a 50 year old Male here with chief complaint(s):     1) Hypertension - Patient notes his b/p is not improved. He agrees to start on different dose. He denies side effects to current medication. He continues to have dizziness although some improvement. He has mild nausea. No chest pain. He has allergy symptoms impacting his blood pressure mostly in the morning.     2) Diabetes check up at 3 months. Wants to recheck labs as this might be why he is still dizzy. Glucose continues to be elevated. No consistently checking at home. Trying to improve eating. Finds it hard to avoid simple carbs. No polyuria or polydypsia. Fatigue all the time. Denies numbness or tingling in feet.    Past Medical History   Diagnosis Date   . Ankylosing spondylitis ([place])    . Esophageal reflux    . Allergic rhinitis due to other allergen    . Unspecified sleep apnea    . Restless legs syndrome (RLS)      Past Surgical History   Procedure Laterality Date   . Cholecystectomy     . Unlisted procedure shoulder       left shoulder   . Esophagogastroduodenoscopy transoral diagnostic  2008     on prevacid   . Colonoscopy stoma dx including collj spec spx  2008     normal per patient     family history is not on file.  Past Surgical History   Procedure Laterality Date   . Cholecystectomy     . Unlisted procedure shoulder       left shoulder   . Esophagogastroduodenoscopy transoral diagnostic  2008     on prevacid   . Colonoscopy stoma dx including collj spec spx  2008     normal per patient       Patient Active Problem List   Diagnosis   . Depressive disorder, not elsewhere classified   . Allergic rhinitis, cause unspecified   . Ankylosing spondylitis ([place])   . Chronic pain   . Hypertension   . Hypothyroidism   . Foot pain   . Other acquired absence of organ   . Other postprocedural status(V45.89)   . Unspecified sleep apnea   . Asthma   . Cough   . Allergic rhinitis due to allergen   . Restless legs syndrome   .  Anterior corneal dystrophy   . Sleep apnea   . Acute vestibular neuritis   . Hypertriglyceridemia     Current Outpatient Prescriptions   Medication Sig Dispense Refill   . AmLODIPine Besylate 2.5 MG Oral Tab Take 1 tablet (2.5 mg) by mouth daily. 30 tablet 1   . Atorvastatin Calcium 20 MG Oral Tab Take 1 tablet (20 mg) by mouth daily. To lower Cholesterol. 90 tablet 1   . Benzonatate 200 MG Oral Cap Take 1 capsule (200 mg) by mouth 3 times a day as needed for cough. 30 capsule 0   . Budesonide 0.5 MG/2ML Inhalation Suspension Inhale 2 mL (0.5 mg) via nebulizer every 12 hours. 120 mL 1   . Certolizumab Pegol (CIMZIA PREFILLED) 2 X 200 MG/ML Subcutaneous Kit Inject 200 mg under the skin every 2 weeks.     . ClonazePAM 0.5 MG Oral Tab Take 1/2 to 1 tablet twice daily as needed 10 tablet 0   . Clotrimazole-Betamethasone 1-0.05 % External Cream Apply 1 application topically 2 times a day as needed (rash). Apply to buttocks 45  g 2   . DULoxetine HCl 60 MG Oral CAPSULE ENTERIC COATED PARTICLES Take 1 capsule (60 mg) by mouth daily. 90 capsule 3   . Esomeprazole Magnesium 40 MG Oral CAPSULE DELAYED RELEASE Take 1 capsule (40 mg) by mouth 2 times a day. Take on empty stomach 180 capsule 1   . Fluticasone Propionate 50 MCG/ACT Nasal Suspension Spray 1 spray into each nostril 2 times a day. 1 bottle 3   . Ipratropium-Albuterol 20-100 MCG/ACT Inhalation Aero Soln Inhale 2 puffs by mouth every 4 hours as needed (cough). 1 Inhaler 11   . LamoTRIgine 100 MG Oral Tab Week 5: 100 mg once daily; Week 6 and maintenance: 200 mg once daily. 180 tablet 2   . LamoTRIgine 25 MG Oral Tab Weeks 1 and 2: 25 mg once daily; Weeks 3 and 4: 50 mg once daily; 45 tablet 0   . Levothyroxine Sodium 88 MCG Oral Tab Take 1 tablet (88 mcg) by mouth daily on an empty stomach. 90 tablet 2   . Losartan Potassium 50 MG Oral Tab One tablet twice daily 60 tablet 1   . Meclizine HCl 25 MG Oral Tab Take 2 tablets (50 mg) by mouth 2 times a day as needed for  dizziness or nausea/vomiting. 60 tablet 0   . Meloxicam 15 MG Oral Tab Take 1 tablet (15 mg) by mouth daily. 90 tablet 2   . MetFORMIN HCl ER 500 MG Oral TABLET SR 24 HR Take 1 tablet (500 mg) by mouth daily. 30 tablet 3   . Montelukast Sodium 10 MG Oral Tab take 1 tablet by mouth every evening 90 tablet 3   . Olopatadine HCl 0.2 % Ophthalmic Solution Place 1 drop in each EYE daily. 2.5 mL 2   . Olopatadine HCl 0.6 % Nasal Solution Spray 2 sprays into each nostril 2 times a day. 1 bottle 11   . Ondansetron 8 MG Oral TABLET DISPERSIBLE Dissolve 1 tablet (8 mg) on top of tongue and swallow every 8 hours as needed for nausea/vomiting. 60 tablet 1   . Pregabalin 225 MG Oral Cap One tablet at dinner and one tablet at bedtime 60 capsule 3   . Respiratory Therapy Supplies (NEBULIZER) Does not apply Device Use every 6 hours as needed for other (chronic bronchitis). Chronic bronchitis 1 Device 0   . ROPINIRole HCl 0.25 MG Oral Tab By mouth, 1 tab (0.25 mg) once daily 1 to 3 hours before bedtime & in 2 days increase to  0.5 mg daily (2 tabs), and after 7 days to 1 mg daily. Dose may be further titrated upward in 0.5 mg increments every week until reaching a daily dose of 3 mg during week 6. Daily dose may be increased to a maximum of 4 mg beginning week 7. 120 tablet 2   . Spacer/Aero-Holding Chambers (AEROCHAMBER Z-STAT PLUS) Misc Use with inhaler as directed. 1 Device 1     No current facility-administered medications for this visit.     Social History     Social History   . Marital Status: Married     Spouse Name: N/A   . Number of Children: N/A   . Years of Education: N/A     Occupational History   . Not on file.     Social History Main Topics   . Smoking status: Former Research scientist (life sciences)   . Smokeless tobacco: Never Used   . Alcohol Use: No   . Drug Use: No   . Sexual  Activity: Not on file     Other Topics Concern   . Not on file     Social History Narrative    Recently moved from [place].  Works for [employer].  One daughter.   Sister is an Therapist, sports.         02/15/12    Lives with his wife.    Daughter is studious     Son has been diagnosed with mild asperger's syndrome.    He works at [employer] and is an Psychologist, sport and exercise and Penicillins  ROS: Review of Systems:  Constitutional: Negative for fatigue, sleep disturbance and weight gain   Eyes: Negative for blurred vision    Ears, Nose, Mouth, Throat: Positive for ringing in ears and sinus problems   Cardiovascular: Negative for palpitations , dyspnea  and peripheral edema    Respiratory: Negative for wheezing    Gastrointestinal: Positive for dyspepsia      Physical:   He is overweight and hypertensive. Appears fatigued  Sinus are now non tender; small amount of nasal congestion  Heart Regular rate and rhythm, S1 and S2 present without murmur  Lungs: Clear no rales, rhonchi, breath sounds equal  Neuro CN II-XII intact and symmetric, strength and sensation to light touch intact and symmetric in all 4 extremities, DTRs 2+ and symmetric at biceps and patellar tendons.  Mental:    General appearance: casually dressed  Behavior/Activity: calm and cooperative  Speech: clear, coherent, fluent.  Affect: normal affect  Mood: flat.        IMPRESSION:      (E11.65) Type 2 diabetes mellitus with hyperglycemia, without long-term current use of insulin (HCC)  (primary encounter diagnosis)  Plan: A1C RAPID, ONSITE, COMPREHENSIVE METABOLIC         PANEL, URINE SCREEN, MICROALBUMINURIA        Foot exam deferred    (H81.10) Benign positional vertigo, unspecified laterality  Plan: Start PT tomorrow    (Z91.09) Environmental allergies  Plan: Fluticasone Propionate 50 MCG/ACT Nasal         Suspension        Continue flonase refilled    (I10) Essential hypertension  Plan: AmLODIPine Besylate 2.5 MG Oral Tab        Add calcium channel blocker    (Z12.11) Screening for colon cancer  Plan: declined has FIT kit at home    (E03.9) Hypothyroidism, unspecified type  Plan: TSH with Reflexive Free T4          (J45.32) Mild persistent asthma with status asthmaticus  Plan: Ipratropium-Albuterol 20-100 MCG/ACT Inhalation        Aero Soln           (G89.29) Other chronic pain  Plan: Pregabalin 225 MG Oral Cap        refill    Orders per documented in this encounter  Patient instructions per documented in this encounter          Patient Instructions   Referrals to [facility], and [facility]  It was a pleasure to see you in clinic today.                If you are not yet signed up for eCare, please see the below eCare section at the end of this document for how to enroll and to get your access codes.  It's easy to sign up at home today.    eCare  enrollment will allow you access to  the below benefits   You can make appointments online   View test results / Lab Results   Request prescription renewals   Obtain a copy of our After Visit Summary (an electronic copy of this document)     Your Test Results:  If labs were ordered today the results are expected to be available via eCare in about 5 days. If you have an active eCare account, this is how we will notify you of your results.     If you do not have an eCare account then your test results will be mailed to you within about 14 days after your tests are completed. If your physician needs to change your care based on your results or is concerned, you should expect a phone call or eCare message from your provider.    If you have any questions about your test results please schedule an appointment with your provider, so that we may review them with you in greater detail.    **If it has been more than 2 weeks and you have not received your test results please send our office a message via [place].    Medication Refills: If you need a prescription refilled, please contact your pharmacy 1 week before your current supply will run out to request the refill.  Contacting your pharmacy is the fastest and safest way to obtain a medication refill.  The pharmacy will notify our office.   Please note, that a minimum of 48 to 72 hours is needed to refill a medication,  Please call your pharmacy early to allow enough time to refill before you anticipate running out.  For faster medication refills, you can also schedule an appointment with your provider.    We know you have a choice in where you receive your healthcare and we sincerely thank you for trusting [facility] with your health.

## 2015-08-15 ENCOUNTER — Other Ambulatory Visit (INDEPENDENT_AMBULATORY_CARE_PROVIDER_SITE_OTHER): Payer: Self-pay | Admitting: Family

## 2015-08-15 DIAGNOSIS — Z129 Encounter for screening for malignant neoplasm, site unspecified: Secondary | ICD-10-CM

## 2015-08-15 NOTE — Telephone Encounter (Signed)
LVM for pt to return call to clinic. Postponing to try for a final attempt.    Number listed above for [employer] does not work.

## 2015-08-15 NOTE — Telephone Encounter (Signed)
Spoke with pt, he states he will bring in forms tomorrow, 08/16/15.

## 2015-08-16 ENCOUNTER — Encounter (INDEPENDENT_AMBULATORY_CARE_PROVIDER_SITE_OTHER): Payer: Self-pay | Admitting: Family

## 2015-08-16 ENCOUNTER — Ambulatory Visit (INDEPENDENT_AMBULATORY_CARE_PROVIDER_SITE_OTHER): Payer: BLUE CROSS/BLUE SHIELD | Admitting: Family

## 2015-08-16 VITALS — BP 159/96 | HR 92 | Temp 99.1°F | Resp 16 | Ht 70.0 in | Wt 260.0 lb

## 2015-08-16 DIAGNOSIS — H8112 Benign paroxysmal vertigo, left ear: Secondary | ICD-10-CM

## 2015-08-16 DIAGNOSIS — H6992 Unspecified Eustachian tube disorder, left ear: Secondary | ICD-10-CM

## 2015-08-16 LAB — OCCULT BLOOD BY IA, STL: Occult Bld 1 Result: NEGATIVE

## 2015-08-16 NOTE — Telephone Encounter (Signed)
Routing to front desk to receive forms.

## 2015-08-16 NOTE — Progress Notes (Signed)
Reviewed eCare status with Patient:  NO    HEALTH MAINTENANCE:  Has the patient had any of these since their last visit?    Cervical screening/PAP: Not Due     Mammo: Not Due    Colon Screen: Not Due    Diabetic Eye Exam: due      Have you seen a specialist since your last visit: No        HM Due: flu,  Health Maintenance   Topic Date Due   . Diabetes Eye Exam  04/24/1982   . Influenza Vaccine (1) 02/23/2015   . A1c  02/06/2016   . Diabetes Foot Exam  06/06/2016   . Colon Cancer Screen w/ FOBT/FIT  08/14/2016   . Diabetes Screen  08/08/2018   . Cholesterol Test  04/03/2020   . Tetanus Vaccine  06/25/2024   . Hepatitis C Screen  Addressed   . HIV Screen  Addressed           Future Appointments  Date Time Provider Department Center   08/16/2015 5:00 PM Eliane Decree, ARNP [facility] NISQ   09/07/2015 9:00 AM Dini, Georga Hacking, Benjaman Lobe [facility] EYE INST   09/19/2015 9:30 AM Ayars, Lina Sayre, MD [facility]

## 2015-08-16 NOTE — Progress Notes (Signed)
Quick Note:      Your lab results is normal.   Please let me know if you have any question.    ______

## 2015-08-16 NOTE — Patient Instructions (Addendum)
It was a pleasure to see you in clinic today.             Continue Exercises  Continue medication    Dr. Lincoln Maxin and he treats ADHD as well as mood and sleep disorder.     Family Psychiatry  [facility]   [address], [address], [address]  [place], [place] [address]  [phone]         Please recall we are moving out clinic today and will reopen Jan 10th, Tuesday.    Dr. Farrel Conners at [facility] in [place], [place], PhD       If you are not yet signed up for eCare, please see the below eCare section at the end of this document for how to enroll and to get your access codes.  It's easy to sign up at home today.    eCare  enrollment will allow you access to the below benefits   You can make appointments online   View test results / Lab Results   Request prescription renewals   Obtain a copy of our After Visit Summary (an electronic copy of this document)     Your Test Results:  If labs were ordered today the results are expected to be available via eCare in about 5 days. If you have an active eCare account, this is how we will notify you of your results.     If you do not have an eCare account then your test results will be mailed to you within about 14 days after your tests are completed. If your physician needs to change your care based on your results or is concerned, you should expect a phone call or eCare message from your provider.    If you have any questions about your test results please schedule an appointment with your provider, so that we may review them with you in greater detail.    **If it has been more than 2 weeks and you have not received your test results please send our office a message via eCare.    Medication Refills: If you need a prescription refilled, please contact your pharmacy 1 week before your current supply will run out to request the refill.  Contacting your pharmacy is the fastest and safest way to obtain a medication refill.   The pharmacy will notify our office.  Please note, that a minimum of 48 to 72 hours is needed to refill a medication,  Please call your pharmacy early to allow enough time to refill before you anticipate running out.  For faster medication refills, you can also schedule an appointment with your provider.    We know you have a choice in where you receive your healthcare and we sincerely thank you for trusting [facility] with your health.

## 2015-08-19 NOTE — Progress Notes (Signed)
Subjective:  Anthony Andrade is a 50 year old Male here with chief complaint(s):     1) He is here to have FMLA paper work completed again. He could not go back to work because he had continued dizziness and vomiting. He is better now and needs to return. He is sleeping better. Has taken all the medication for his sinusitis and BPV. He has also increased b/p medication as instructed at his last visit. He has no chest pain or headache. Changing position is still noticeable but managable. He is driving now. No further falls.  2) He continues to struggle with his obessesion. He is working with therapist and shares that he still has difficulty but is improving. Denies anxiety but experiencing moderate depression. Taking medication has helped. Still does not sleep well but sleeps more than he should. Not motivated to improve his exercise. Working on weight loss though and eating better.       Past Medical History   Diagnosis Date   . Ankylosing spondylitis ([place])    . Esophageal reflux    . Allergic rhinitis due to other allergen    . Unspecified sleep apnea    . Restless legs syndrome (RLS)      Past Surgical History   Procedure Laterality Date   . Cholecystectomy     . Unlisted procedure shoulder       left shoulder   . Esophagogastroduodenoscopy transoral diagnostic  2008     on prevacid   . Colonoscopy stoma dx including collj spec spx  2008     normal per patient     family history is not on file.  Past Surgical History   Procedure Laterality Date   . Cholecystectomy     . Unlisted procedure shoulder       left shoulder   . Esophagogastroduodenoscopy transoral diagnostic  2008     on prevacid   . Colonoscopy stoma dx including collj spec spx  2008     normal per patient       Patient Active Problem List   Diagnosis   . Depressive disorder, not elsewhere classified   . Allergic rhinitis, cause unspecified   . Ankylosing spondylitis ([place])   . Chronic pain   . Hypertension   . Hypothyroidism   . Foot pain   . Other  acquired absence of organ   . Other postprocedural status(V45.89)   . Unspecified sleep apnea   . Asthma   . Cough   . Allergic rhinitis due to allergen   . Restless legs syndrome   . Anterior corneal dystrophy   . Sleep apnea   . Acute vestibular neuritis   . Hypertriglyceridemia     Current Outpatient Prescriptions   Medication Sig Dispense Refill   . AmLODIPine Besylate 2.5 MG Oral Tab Take 1 tablet (2.5 mg) by mouth daily. 30 tablet 1   . Atorvastatin Calcium 20 MG Oral Tab Take 1 tablet (20 mg) by mouth daily. To lower Cholesterol. 90 tablet 1   . Benzonatate 200 MG Oral Cap Take 1 capsule (200 mg) by mouth 3 times a day as needed for cough. 30 capsule 0   . Budesonide 0.5 MG/2ML Inhalation Suspension Inhale 2 mL (0.5 mg) via nebulizer every 12 hours. 120 mL 1   . Certolizumab Pegol (CIMZIA PREFILLED) 2 X 200 MG/ML Subcutaneous Kit Inject 200 mg under the skin every 2 weeks.     . ClonazePAM 0.5 MG Oral Tab Take 1/2 to  1 tablet twice daily as needed 10 tablet 0   . Clotrimazole-Betamethasone 1-0.05 % External Cream Apply 1 application topically 2 times a day as needed (rash). Apply to buttocks 45 g 2   . DULoxetine HCl 60 MG Oral CAPSULE ENTERIC COATED PARTICLES Take 1 capsule (60 mg) by mouth daily. 90 capsule 3   . Esomeprazole Magnesium 40 MG Oral CAPSULE DELAYED RELEASE Take 1 capsule (40 mg) by mouth 2 times a day. Take on empty stomach 180 capsule 1   . Fluticasone Propionate 50 MCG/ACT Nasal Suspension Spray 1 spray into each nostril 2 times a day. 1 bottle 3   . Ipratropium-Albuterol 20-100 MCG/ACT Inhalation Aero Soln Inhale 2 puffs by mouth every 4 hours as needed (cough). 1 Inhaler 11   . LamoTRIgine 100 MG Oral Tab Week 5: 100 mg once daily; Week 6 and maintenance: 200 mg once daily. 180 tablet 2   . LamoTRIgine 25 MG Oral Tab Weeks 1 and 2: 25 mg once daily; Weeks 3 and 4: 50 mg once daily; 45 tablet 0   . Levothyroxine Sodium 88 MCG Oral Tab Take 1 tablet (88 mcg) by mouth daily on an empty  stomach. 90 tablet 2   . Losartan Potassium 50 MG Oral Tab One tablet twice daily 60 tablet 1   . Meclizine HCl 25 MG Oral Tab Take 2 tablets (50 mg) by mouth 2 times a day as needed for dizziness or nausea/vomiting. 60 tablet 0   . Meloxicam 15 MG Oral Tab Take 1 tablet (15 mg) by mouth daily. 90 tablet 2   . MetFORMIN HCl ER 500 MG Oral TABLET SR 24 HR Take 1 tablet (500 mg) by mouth daily. 30 tablet 3   . Montelukast Sodium 10 MG Oral Tab take 1 tablet by mouth every evening 90 tablet 3   . Olopatadine HCl 0.2 % Ophthalmic Solution Place 1 drop in each EYE daily. 2.5 mL 2   . Olopatadine HCl 0.6 % Nasal Solution Spray 2 sprays into each nostril 2 times a day. 1 bottle 11   . Ondansetron 8 MG Oral TABLET DISPERSIBLE Dissolve 1 tablet (8 mg) on top of tongue and swallow every 8 hours as needed for nausea/vomiting. 60 tablet 1   . Pregabalin 225 MG Oral Cap One tablet at dinner and one tablet at bedtime 60 capsule 3   . Respiratory Therapy Supplies (NEBULIZER) Does not apply Device Use every 6 hours as needed for other (chronic bronchitis). Chronic bronchitis 1 Device 0   . ROPINIRole HCl 0.25 MG Oral Tab By mouth, 1 tab (0.25 mg) once daily 1 to 3 hours before bedtime & in 2 days increase to  0.5 mg daily (2 tabs), and after 7 days to 1 mg daily. Dose may be further titrated upward in 0.5 mg increments every week until reaching a daily dose of 3 mg during week 6. Daily dose may be increased to a maximum of 4 mg beginning week 7. 120 tablet 2   . Spacer/Aero-Holding Chambers (AEROCHAMBER Z-STAT PLUS) Misc Use with inhaler as directed. 1 Device 1     No current facility-administered medications for this visit.     Social History     Social History   . Marital Status: Married     Spouse Name: N/A   . Number of Children: N/A   . Years of Education: N/A     Occupational History   . Not on file.     Social History  Main Topics   . Smoking status: Former Research scientist (life sciences)   . Smokeless tobacco: Never Used   . Alcohol Use: No   . Drug  Use: No   . Sexual Activity: Not on file     Other Topics Concern   . Not on file     Social History Narrative    Recently moved from [place].  Works for [employer].  One daughter.  Sister is an Therapist, sports.         02/15/12    Lives with his wife.    Daughter is studious     Son has been diagnosed with mild asperger's syndrome.    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 .   Ears, Nose, Mouth, Throat: Positive for ringing in ears, dizziness and sinus problems   Cardiovascular: Negative    Respiratory: Negative    Gastrointestinal: Negative    Neurological: Negative for tremor  and abnormal sensations    Psychiatric: As noted in HPI above    OBJECTIVE:    Physical:   Overweight male with elevated b/p. Slightly pale cautiously moves throughout the room  Heart Regular rate and rhythm, S1 and S2 present without murmur  Lungs: Clear no rales, rhonchi, breath sounds equal  B/p reduced after second reading and relaxing  Neuro: alert, smiling, speech clear, balance intact, strength intact        Mental:    General appearance: casually dressed  Behavior/Activity: calm and cooperative  Speech: clear, coherent, fluent.  Affect: blunted, depressed and mood congruent  Mood: depressed.        IMPRESSION:    I spent a total time of 30 minutes face-to-face with the patient, of which more than 50% was spent counseling as outlined in this note.    (H81.12) Benign paroxysmal positional vertigo, left  (primary encounter diagnosis)  (H69.92) Eustachian tube disorder, left  Plan: REFERRAL TO CHIROPRACTOR, REFERRAL TO OTO-HEAD         NECK SURGERY  COntinue PT  Continue home therapy  Return to work on Monday  Orders per documented in this encounter  Patient instructions per documented in this encounter          Patient Instructions     It was a pleasure to  see you in clinic today.             Continue Exercises  Continue medication    Dr. Lavon Paganini and he treats ADHD as well as mood and sleep disorder.     [facility]   [address], [place], [place]  [place], [place]  [phone]         Please recall we are moving out clinic today and will reopen Jan 10th, Tuesday.    Dr. Joslyn Hy at [facility] in [place], [place], PhD       If you are not yet signed up for eCare, please see the below eCare section at the end of this document for how to enroll and to get your access codes.  It's easy to sign up at home today.    eCare  enrollment will allow you access to the below benefits   You can make appointments online  View test results / Lab Results   Request prescription renewals   Obtain a copy of our After Visit Summary (an electronic copy of this document)     Your Test Results:  If labs were ordered today the results are expected to be available via eCare in about 5 days. If you have an active eCare account, this is how we will notify you of your results.     If you do not have an eCare account then your test results will be mailed to you within about 14 days after your tests are completed. If your physician needs to change your care based on your results or is concerned, you should expect a phone call or eCare message from your provider.    If you have any questions about your test results please schedule an appointment with your provider, so that we may review them with you in greater detail.    **If it has been more than 2 weeks and you have not received your test results please send our office a message via [place].    Medication Refills: If you need a prescription refilled, please contact your pharmacy 1 week before your current supply will run out to request the refill.  Contacting your pharmacy is the fastest and safest way to obtain a medication refill.  The pharmacy will notify  our office.  Please note, that a minimum of 48 to 72 hours is needed to refill a medication,  Please call your pharmacy early to allow enough time to refill before you anticipate running out.  For faster medication refills, you can also schedule an appointment with your provider.    We know you have a choice in where you receive your healthcare and we sincerely thank you for trusting [facility] with your health.

## 2015-08-21 ENCOUNTER — Ambulatory Visit (INDEPENDENT_AMBULATORY_CARE_PROVIDER_SITE_OTHER): Payer: Self-pay | Admitting: Family

## 2015-08-21 NOTE — Telephone Encounter (Signed)
3rd Attempt. Left message for patient to return call with the status.  Waiting for patient to respond.  Closing TE.

## 2015-08-22 ENCOUNTER — Telehealth (INDEPENDENT_AMBULATORY_CARE_PROVIDER_SITE_OTHER): Payer: Self-pay | Admitting: Family

## 2015-08-22 NOTE — Telephone Encounter (Signed)
Received a [employer] form via [employer]. Form placed in Dr.Meyer's in box to complete    Main contact for form if there are questions/concerns: Event organiser #: [phone]  Fax # [phone]  Please route back to the [employer]

## 2015-08-23 ENCOUNTER — Telehealth (INDEPENDENT_AMBULATORY_CARE_PROVIDER_SITE_OTHER): Payer: Self-pay | Admitting: Family

## 2015-08-23 ENCOUNTER — Ambulatory Visit (INDEPENDENT_AMBULATORY_CARE_PROVIDER_SITE_OTHER): Payer: BLUE CROSS/BLUE SHIELD | Admitting: Family

## 2015-08-23 ENCOUNTER — Encounter (INDEPENDENT_AMBULATORY_CARE_PROVIDER_SITE_OTHER): Payer: Self-pay | Admitting: Family

## 2015-08-23 VITALS — BP 145/103 | HR 105 | Temp 98.0°F | Ht 70.0 in | Wt 249.0 lb

## 2015-08-23 DIAGNOSIS — T887XXA Unspecified adverse effect of drug or medicament, initial encounter: Secondary | ICD-10-CM

## 2015-08-23 DIAGNOSIS — R1115 Cyclical vomiting syndrome unrelated to migraine: Secondary | ICD-10-CM

## 2015-08-23 DIAGNOSIS — E119 Type 2 diabetes mellitus without complications: Secondary | ICD-10-CM

## 2015-08-23 DIAGNOSIS — IMO0001 Reserved for inherently not codable concepts without codable children: Secondary | ICD-10-CM

## 2015-08-23 DIAGNOSIS — G43A0 Cyclical vomiting, not intractable: Secondary | ICD-10-CM

## 2015-08-23 DIAGNOSIS — R110 Nausea: Secondary | ICD-10-CM

## 2015-08-23 DIAGNOSIS — F331 Major depressive disorder, recurrent, moderate: Secondary | ICD-10-CM

## 2015-08-23 DIAGNOSIS — T50905A Adverse effect of unspecified drugs, medicaments and biological substances, initial encounter: Secondary | ICD-10-CM

## 2015-08-23 LAB — COMPREHENSIVE METABOLIC PANEL
ALT (GPT): 91 U/L — ABNORMAL HIGH (ref 10–48)
AST (GOT): 70 U/L — ABNORMAL HIGH (ref 9–38)
Albumin: 4.3 g/dL (ref 3.5–5.2)
Alkaline Phosphatase (Total): 92 U/L (ref 39–139)
Anion Gap: 9 (ref 4–12)
Bilirubin (Total): 1.1 mg/dL (ref 0.2–1.3)
Calcium: 9.2 mg/dL (ref 8.9–10.2)
Carbon Dioxide, Total: 27 meq/L (ref 22–32)
Chloride: 100 meq/L (ref 98–108)
Creatinine: 0.95 mg/dL (ref 0.51–1.18)
GFR, Calc, African American: >60 mL/min/{1.73_m2}
GFR, Calc, European American: >60 mL/min/{1.73_m2}
Glucose: 285 mg/dL — ABNORMAL HIGH (ref 62–125)
Potassium: 3.6 meq/L (ref 3.6–5.2)
Protein (Total): 6.8 g/dL (ref 6.0–8.2)
Sodium: 136 meq/L (ref 135–145)
Urea Nitrogen: 12 mg/dL (ref 8–21)

## 2015-08-23 MED ORDER — METFORMIN HCL ER 750 MG OR TB24
750.0000 mg | EXTENDED_RELEASE_TABLET | Freq: Every day | ORAL | Status: DC
Start: 2015-08-23 — End: 2016-02-08

## 2015-08-23 NOTE — Progress Notes (Signed)
Subjective    Anthony Andrade is  here with chief complaint:    1) He continues to experience dizziness and nausea with cyclic vomiting. He now notices he has difficulty focusing when driving and wife drove him to office. He is tracking symptoms.  He wants to consider changing medications. Psychiatrist added Lamictal and he has not felt good since then. Wonders if he is over medicated.   Needs forms for work again.    Review of Systems:   Appetite is poor and knows glucose is elevated as he is drinking milk shakes - these do not increase nausea.  Headache - constant low grade  Vision: no blurred vision but feels like eyes twitch  No difficulty swallowing  Mild to moderate sinus drainage chronic  Neck - always painful, decreased range of motion. Worse in the morning and after work  Heart: no chest pain, palpitations   Lungs: No shortness of breath lying down or at rest. Fatigues easily with exercise or activity at times  No wheezing  Abdomen: No pain, diarrhea or constipation, no bloody stools. He does have the N/V.  GU: no increase in urination, no blood in urine, no flank pain   Skin: no rash  Psych: Feels like zoombie, tearful off and on  Frustrated with current situation    Patient Active Problem List   Diagnosis   . Depressive disorder, not elsewhere classified   . Allergic rhinitis, cause unspecified   . Ankylosing spondylitis ([place])   . Chronic pain   . Hypertension   . Hypothyroidism   . Foot pain   . Other acquired absence of organ   . Other postprocedural status(V45.89)   . Unspecified sleep apnea   . Asthma   . Cough   . Allergic rhinitis due to allergen   . Restless legs syndrome   . Anterior corneal dystrophy   . Sleep apnea   . Acute vestibular neuritis   . Hypertriglyceridemia     .    Patient Active Problem List   Diagnosis   . Depressive disorder, not elsewhere classified   . Allergic rhinitis, cause unspecified   . Ankylosing spondylitis ([place])   . Chronic pain   . Hypertension   .  Hypothyroidism   . Foot pain   . Other acquired absence of organ   . Other postprocedural status(V45.89)   . Unspecified sleep apnea   . Asthma   . Cough   . Allergic rhinitis due to allergen   . Restless legs syndrome   . Anterior corneal dystrophy   . Sleep apnea   . Acute vestibular neuritis   . Hypertriglyceridemia     Current Outpatient Prescriptions   Medication Sig Dispense Refill   . AmLODIPine Besylate 2.5 MG Oral Tab Take 1 tablet (2.5 mg) by mouth daily. 30 tablet 1   . Atorvastatin Calcium 20 MG Oral Tab Take 1 tablet (20 mg) by mouth daily. To lower Cholesterol. 90 tablet 1   . Benzonatate 200 MG Oral Cap Take 1 capsule (200 mg) by mouth 3 times a day as needed for cough. 30 capsule 0   . Budesonide 0.5 MG/2ML Inhalation Suspension Inhale 2 mL (0.5 mg) via nebulizer every 12 hours. 120 mL 1   . Certolizumab Pegol (CIMZIA PREFILLED) 2 X 200 MG/ML Subcutaneous Kit Inject 200 mg under the skin every 2 weeks.     . ClonazePAM 0.5 MG Oral Tab Take 1/2 to 1 tablet twice daily as needed 10 tablet  0   . Clotrimazole-Betamethasone 1-0.05 % External Cream Apply 1 application topically 2 times a day as needed (rash). Apply to buttocks 45 g 2   . DULoxetine HCl 60 MG Oral CAPSULE ENTERIC COATED PARTICLES Take 1 capsule (60 mg) by mouth daily. 90 capsule 3   . Esomeprazole Magnesium 40 MG Oral CAPSULE DELAYED RELEASE Take 1 capsule (40 mg) by mouth 2 times a day. Take on empty stomach 180 capsule 1   . Fluticasone Propionate 50 MCG/ACT Nasal Suspension Spray 1 spray into each nostril 2 times a day. 1 bottle 3   . Ipratropium-Albuterol 20-100 MCG/ACT Inhalation Aero Soln Inhale 2 puffs by mouth every 4 hours as needed (cough). 1 Inhaler 11   . LamoTRIgine 100 MG Oral Tab Week 5: 100 mg once daily; Week 6 and maintenance: 200 mg once daily. 180 tablet 2   . LamoTRIgine 25 MG Oral Tab Weeks 1 and 2: 25 mg once daily; Weeks 3 and 4: 50 mg once daily; 45 tablet 0   . Levothyroxine Sodium 88 MCG Oral Tab Take 1 tablet (88  mcg) by mouth daily on an empty stomach. 90 tablet 2   . Losartan Potassium 50 MG Oral Tab One tablet twice daily 60 tablet 1   . Meclizine HCl 25 MG Oral Tab Take 2 tablets (50 mg) by mouth 2 times a day as needed for dizziness or nausea/vomiting. 60 tablet 0   . Meloxicam 15 MG Oral Tab Take 1 tablet (15 mg) by mouth daily. 90 tablet 2   . MetFORMIN HCl ER 500 MG Oral TABLET SR 24 HR Take 1 tablet (500 mg) by mouth daily. 30 tablet 3   . Montelukast Sodium 10 MG Oral Tab take 1 tablet by mouth every evening 90 tablet 3   . Olopatadine HCl 0.2 % Ophthalmic Solution Place 1 drop in each EYE daily. 2.5 mL 2   . Olopatadine HCl 0.6 % Nasal Solution Spray 2 sprays into each nostril 2 times a day. 1 bottle 11   . Ondansetron 8 MG Oral TABLET DISPERSIBLE Dissolve 1 tablet (8 mg) on top of tongue and swallow every 8 hours as needed for nausea/vomiting. 60 tablet 1   . Pregabalin 225 MG Oral Cap One tablet at dinner and one tablet at bedtime 60 capsule 3   . Respiratory Therapy Supplies (NEBULIZER) Does not apply Device Use every 6 hours as needed for other (chronic bronchitis). Chronic bronchitis 1 Device 0   . ROPINIRole HCl 0.25 MG Oral Tab By mouth, 1 tab (0.25 mg) once daily 1 to 3 hours before bedtime & in 2 days increase to  0.5 mg daily (2 tabs), and after 7 days to 1 mg daily. Dose may be further titrated upward in 0.5 mg increments every week until reaching a daily dose of 3 mg during week 6. Daily dose may be increased to a maximum of 4 mg beginning week 7. 120 tablet 2   . Spacer/Aero-Holding Chambers (AEROCHAMBER Z-STAT PLUS) Misc Use with inhaler as directed. 1 Device 1     No current facility-administered medications for this visit.       Allergies-Morphine and Penicillins    History sections of chart reviewed and updated today: Yes See medical, surgical, social, and family.    Objective  Constitution: 11 pounds weight loss. B/p elevated at first but reduced after 10 minutes.  HEENT: EOM intact but continues  to have mild nystigmus, PERRLA  No sinus tenderness  Neck: Supple,  decrease flexion and lateral movement bilaterally  Heart Regular rate and rhythm, S1 and S2 present without murmur  Lungs: Clear no rales, rhonchi, breath sounds equal  Neuro: balance - unable to stand on one foot, fine motor coordination present  Skin: warm, dry mildly dehydrated?  Psych: Anxious, affect blunted    History: Updated social, medical     IMPRESSION:    I spent a total time of 25 minutes face-to-face with the patient, of which more than 50% was spent counseling and coordinating care as outlined in this note.  We discussed and reviewed his medications. We also discussed the need for measuring his glucose daily. He agreed to start glucometer at home.   Lamictal appears to cause side effects and changing his personality.      PLAN:    1) Start weaning from Lamictal 50 mg decrease per week. See Psychiatry for follow up. Needs to find an appropriate recourse. Start counseling for addiction. Needs to start as soon as possible.   I will resend referral for resource to BHIP to identify ongoing psychiatric services and counseling in the [facility] [place] area    2) Medication Reaction: Repeat BMP. . Concerned about liver function. He has fatty liver and I am not sure he can metabolize all of the various medications he is prescribed.  Work to reduce medication list.    3) Diabetes - recheck glucose Order glucometer. May need to increase/change diabetes medication. Start rigorous exercise program and PT for back. Consider weight loss medication such as Contrave.    4) Monitor b/p. Continue current medication and monitor     5) High risk for continued sick leave.  See every week. Offered evening appointment if needed.                 Medical Decision Making:    moderate complexity       ASSESSMENT/PLAN:      No diagnosis found.          Patient was provided patient education and follow up instructions on AVS    Health Maintenance reviewed  -  Health Maintenance Due   Topic   . Diabetes Eye Exam        Patient Instructions     It was a pleasure to see you in clinic today.                If you are not yet signed up for eCare, please see the below eCare section at the end of this document for how to enroll and to get your access codes.  It's easy to sign up at home today.    eCare  enrollment will allow you access to the below benefits   You can make appointments online   View test results / Lab Results   Request prescription renewals   Obtain a copy of our After Visit Summary (an electronic copy of this document)     Your Test Results:  If labs were ordered today the results are expected to be available via eCare in about 5 days. If you have an active eCare account, this is how we will notify you of your results.     If you do not have an eCare account then your test results will be mailed to you within about 14 days after your tests are completed. If your physician needs to change your care based on your results or is concerned, you should expect a phone call or eCare message  from your provider.    If you have any questions about your test results please schedule an appointment with your provider, so that we may review them with you in greater detail.    **If it has been more than 2 weeks and you have not received your test results please send our office a message via [place].    Medication Refills: If you need a prescription refilled, please contact your pharmacy 1 week before your current supply will run out to request the refill.  Contacting your pharmacy is the fastest and safest way to obtain a medication refill.  The pharmacy will notify our office.  Please note, that a minimum of 48 to 72 hours is needed to refill a medication,  Please call your pharmacy early to allow enough time to refill before you anticipate running out.  For faster medication refills, you can also schedule an appointment with your provider.    We know you have a choice  in where you receive your healthcare and we sincerely thank you for trusting [facility] with your health.

## 2015-08-23 NOTE — Progress Notes (Signed)
Reviewed eCare status with Patient:  YES    HEALTH MAINTENANCE:  Has the patient had any of these since their last visit?    Cervical screening/PAP: Not Due     Mammo: Not Due    Colon Screen: Not Due    Diabetic Eye Exam: Due - patient has referral     Have you seen a specialist since your last visit: No        HM Due:   Health Maintenance   Topic Date Due   . Diabetes Eye Exam  04/24/1982   . A1c  02/06/2016   . Diabetes Foot Exam  06/06/2016   . Colon Cancer Screen w/ FOBT/FIT  08/14/2016   . Diabetes Screen  08/08/2018   . Cholesterol Test  04/03/2020   . Tetanus Vaccine  06/25/2024   . Influenza Vaccine  Addressed   . Hepatitis C Screen  Addressed   . HIV Screen  Addressed           Future Appointments  Date Time Provider Department Center   09/07/2015 9:00 AM Dini, Dalbert Batman [facility] EYE INST   09/19/2015 9:30 AM Ayars, Lina Sayre, MD [facility]

## 2015-08-23 NOTE — Telephone Encounter (Signed)
Please print copies of medical records including diagnostics, labs, TEs and office notes from February 2 visit through 3/1.  [employer] is requesting a copy to go with an FMLA form i am completing    Thank you

## 2015-08-23 NOTE — Progress Notes (Signed)
Quick Note:    The results of your glucose level and liver enzymes are abnormal.    I left a voice mail for you and sent ecare message. We need to discuss your Diabetes medications and your lamictal. I am so concerned about your health. Do you have time to talk in the next 2 days? If you would rather return to clinic, I have appointments Thursday through Saturday. Let me know what time works for you.    All other results are normal.    [name], ARNP, PhD    ______

## 2015-08-23 NOTE — Patient Instructions (Addendum)
It was a pleasure to see you in clinic today.                 LAB today and then I will call this evening         If you are not yet signed up for eCare, please see the below eCare section at the end of this document for how to enroll and to get your access codes.  It's easy to sign up at home today.    eCare  enrollment will allow you access to the below benefits   You can make appointments online   View test results / Lab Results   Request prescription renewals   Obtain a copy of our After Visit Summary (an electronic copy of this document)     Your Test Results:  If labs were ordered today the results are expected to be available via eCare in about 5 days. If you have an active eCare account, this is how we will notify you of your results.     If you do not have an eCare account then your test results will be mailed to you within about 14 days after your tests are completed. If your physician needs to change your care based on your results or is concerned, you should expect a phone call or eCare message from your provider.    If you have any questions about your test results please schedule an appointment with your provider, so that we may review them with you in greater detail.    **If it has been more than 2 weeks and you have not received your test results please send our office a message via eCare.    Medication Refills: If you need a prescription refilled, please contact your pharmacy 1 week before your current supply will run out to request the refill.  Contacting your pharmacy is the fastest and safest way to obtain a medication refill.  The pharmacy will notify our office.  Please note, that a minimum of 48 to 72 hours is needed to refill a medication,  Please call your pharmacy early to allow enough time to refill before you anticipate running out.  For faster medication refills, you can also schedule an appointment with your provider.    We know you have a choice in where you receive your  healthcare and we sincerely thank you for trusting [facility] with your health.

## 2015-08-23 NOTE — Telephone Encounter (Signed)
Form completed at [facility] 08/23/2015. Faxed to  [phone].

## 2015-08-23 NOTE — Telephone Encounter (Signed)
Left a message that his glucose level was very high.  Continue to return to the clinic for follow-up visit or contact me by telephone so we can set up a time discuss a solution to his elevated hyperglycemia.  I also let him know that he thinks he is correct that he may be experiencing side effects from the Lamictal suggest we start weaning off.  I am sending him to message as well to this effect.  Old that he can either see Dr. Chauncey Fischer again soon such as Friday or I will have a chance to check with her about his medication plan.

## 2015-08-24 LAB — LAMOTRIGINE: Lamotrigine: 2.4 ug/mL (ref 2.0–20.0)

## 2015-08-24 MED ORDER — BLOOD GLUCOSE TEST VI STRP
1.0000 | ORAL_STRIP | Freq: Every day | Status: AC
Start: 2015-08-24 — End: ?

## 2015-08-24 MED ORDER — LANCETS THIN MISC
1.0000 | Freq: Every day | Status: AC
Start: 2015-08-24 — End: ?

## 2015-08-24 MED ORDER — BLOOD GLUCOSE MONITORING SUPPL KIT
PACK | Status: AC
Start: 2015-08-24 — End: ?

## 2015-08-24 NOTE — Telephone Encounter (Signed)
Routing to [employer] to assist with forms/paperwork, thank you.

## 2015-08-25 ENCOUNTER — Other Ambulatory Visit (INDEPENDENT_AMBULATORY_CARE_PROVIDER_SITE_OTHER): Payer: Self-pay

## 2015-08-25 NOTE — Telephone Encounter (Signed)
To the Attention of: Dr Cleda Daubebecca Magdiel Bartles

## 2015-08-25 NOTE — Progress Notes (Signed)
Quick Note:      Your lab results is normal.   Please let me know if you have any question.    ______

## 2015-08-25 NOTE — Telephone Encounter (Signed)
Nash DimmerKerry Meyer-Do you have the FMLA paperwork completed?  We are needing the fax# with the FMLA paperwork to fax it.  Please advise.

## 2015-08-25 NOTE — Progress Notes (Signed)
Scheduled patient with Alona BeneJoyce, RN on 09/04/15 for initial DM Care Management Visit.

## 2015-08-25 NOTE — Telephone Encounter (Signed)
[phone] (Fax)    His case # is [id number]

## 2015-08-28 ENCOUNTER — Encounter (INDEPENDENT_AMBULATORY_CARE_PROVIDER_SITE_OTHER): Payer: Self-pay | Admitting: Family

## 2015-08-28 NOTE — Telephone Encounter (Signed)
Typing error. To Anthony Andrade. Form placed back in [place] Andrade in box to complete.

## 2015-08-29 NOTE — Telephone Encounter (Signed)
Completed again

## 2015-08-30 NOTE — Telephone Encounter (Signed)
Form with printed office visit notes [facility]. Routing to front desk.

## 2015-08-30 NOTE — Telephone Encounter (Signed)
Per Ayesha Rumpfolin the documentation was faxed yesterday.  Closing TE.

## 2015-09-04 ENCOUNTER — Encounter (INDEPENDENT_AMBULATORY_CARE_PROVIDER_SITE_OTHER): Payer: Self-pay | Admitting: Family

## 2015-09-04 ENCOUNTER — Ambulatory Visit (INDEPENDENT_AMBULATORY_CARE_PROVIDER_SITE_OTHER): Payer: BLUE CROSS/BLUE SHIELD | Admitting: Family

## 2015-09-04 ENCOUNTER — Ambulatory Visit (INDEPENDENT_AMBULATORY_CARE_PROVIDER_SITE_OTHER): Payer: BLUE CROSS/BLUE SHIELD

## 2015-09-04 VITALS — BP 163/97 | HR 85 | Temp 98.7°F | Ht 70.0 in | Wt 253.0 lb

## 2015-09-04 DIAGNOSIS — R42 Dizziness and giddiness: Secondary | ICD-10-CM

## 2015-09-04 DIAGNOSIS — I10 Essential (primary) hypertension: Secondary | ICD-10-CM

## 2015-09-04 MED ORDER — AMLODIPINE BESYLATE 5 MG OR TABS
ORAL_TABLET | ORAL | Status: DC
Start: 2015-09-04 — End: 2015-09-18

## 2015-09-04 NOTE — Progress Notes (Signed)
Care Management Visit    Reason for visit:  Meet and greet for diabetes care management, pt here to see PCP after this  visit    Subjective:  Pt reports he knows "I'm a ticking time bomb" with concerns regarding increased blood pressure, elevated cholesterol and DM.  Pt reports he is interested in getting all of this under control.  He states his mother was a diabetic, had to check her blood sugars 3 times daily and was on insulin. Pt states he and his wife have started eating with "the Mediterranean Diet" modifications.  He is interested in eventually incorporating exercise into his routine and has researched gym availability at [employer] [place].  He states he is interested in a dietary referral    Objective:    A1C [facility] AMB Latest Ref Rng 04/10/2015 08/09/2015   A1C 4.0 - 6.0 % 6.4 (H) 7.7 (H)     Vitals 06/14/2015 07/28/2015 07/28/2015 08/02/2015 08/02/2015 08/09/2015   Systolic 173 149 [id number] 154 164 161   Diastolic 109 96 93 97 97 106     Vitals 08/09/2015 08/09/2015 08/09/2015 08/09/2015 08/16/2015   Systolic  143 153 149 159   Diastolic  96 100 [id number] 96     Vitals 08/23/2015 09/04/2015   Systolic 145 163   Diastolic 103 97    Pt reports his blood pressure was 172/105 this morning.  His blood sugar yesterday 45 minutes after he ate breakfast was 212, this morning fasting was 105.    Assessment:  Uncontrolled diabetes, hypertension    Interventions:  Commended patient on dietary modifications.  Education topics/tools reviewed:  Yes, Diet and exercise,weight loss and effects on blood sugars,  A1c and EAG, Metformin actions, target fasting and post prandial blood sugar readings   Self-management tools reviewed:  None   Referrals:  None    Care Management Plan:  Pt will see Dr. Daphane ShepherdMeyer, discuss his concerns including dietary referral.  Will touch base based on outcome of PCP follow up visit or in 2 weeks.

## 2015-09-04 NOTE — Progress Notes (Signed)
Quick Note:      Your EKG results are normal.   Please let me know if you have any question.    Please do not hesitate to contact me if you have further questions.    Fonnie MuKerry Zameer Borman, ARNP, PhD      ______

## 2015-09-04 NOTE — Progress Notes (Signed)
Reviewed eCare status with Patient:  YES    HEALTH MAINTENANCE:  Has the patient had any of these since their last visit?    Cervical screening/PAP: Not Due     Mammo: Not Due    Colon Screen: Not Due    Diabetic Eye Exam: patient has been given referral       Have you seen a specialist since your last visit: No        HM Due:   Health Maintenance   Topic Date Due   . Diabetes Eye Exam  04/24/1982   . A1c  02/06/2016   . Diabetes Foot Exam  06/06/2016   . Colon Cancer Screen w/ FOBT/FIT  08/14/2016   . Diabetes Screen  08/23/2018   . Cholesterol Test  04/03/2020   . Tetanus Vaccine  06/25/2024   . Influenza Vaccine  Addressed   . Hepatitis C Screen  Addressed   . HIV Screen  Addressed           Future Appointments  Date Time Provider Department Center   09/04/2015 11:00 AM [name], [name], ARNP [facility]   09/07/2015 9:00 AM Dini, Georga HackingSusan M, Benjaman LobeOD [facility]   09/19/2015 9:30 AM Ayars, Lina SayreAndrew Garrison, MD [facility]

## 2015-09-04 NOTE — Progress Notes (Signed)
CC: He is improving at the reduced dose of lamictal.  He is well enough to go walking. Nausea and dizziness are improved. One major incident only. He rolled over one night and had to crawl to the bathroom. He vomited. He does get anxious. He knows he has to go back to work.     Really worried about his health. Worried elevated glucose, hypertension, sinus congestion and liver enzyme elevation might be a problem for him.  He called his Neurologist, Dr. Leeroy Cha who ordered a BRAIN MRI on 3/20. He sees Dr Leeroy Cha through poly clinic and will follow up with him. He has appointments with ENT, Allergy and Optha in the next two weeks.    ROS   Weight is stable, no fever  Continues to experience moderate sinus drainage.   Denies chest pain or palpitations but does suggest he has experienced some heaviness in the chest.   He is also checking his glucose at home and his glucose was 212 and b/p 172/103.   Today he had glucose level of  105 fasting today.     Patient Active Problem List   Diagnosis   . Depressive disorder, not elsewhere classified   . Allergic rhinitis, cause unspecified   . Ankylosing spondylitis ([place])   . Chronic pain   . Hypertension   . Hypothyroidism   . Foot pain   . Other acquired absence of organ   . Other postprocedural status(V45.89)   . Unspecified sleep apnea   . Asthma   . Cough   . Allergic rhinitis due to allergen   . Restless legs syndrome   . Anterior corneal dystrophy   . Sleep apnea   . Acute vestibular neuritis   . Hypertriglyceridemia     Outpatient Prescriptions Prior to Visit   Medication Sig Dispense Refill   . AmLODIPine Besylate 2.5 MG Oral Tab Take 1 tablet (2.5 mg) by mouth daily. 30 tablet 1   . Atorvastatin Calcium 20 MG Oral Tab Take 1 tablet (20 mg) by mouth daily. To lower Cholesterol. 90 tablet 1   . Benzonatate 200 MG Oral Cap Take 1 capsule (200 mg) by mouth 3 times a day as needed for cough. 30 capsule 0   . Blood Glucose Monitoring Suppl Kit Use to check blood sugar once daily 1  kit 0   . Budesonide 0.5 MG/2ML Inhalation Suspension Inhale 2 mL (0.5 mg) via nebulizer every 12 hours. 120 mL 1   . Certolizumab Pegol (CIMZIA PREFILLED) 2 X 200 MG/ML Subcutaneous Kit Inject 200 mg under the skin every 2 weeks.     . ClonazePAM 0.5 MG Oral Tab Take 1/2 to 1 tablet twice daily as needed 10 tablet 0   . Clotrimazole-Betamethasone 1-0.05 % External Cream Apply 1 application topically 2 times a day as needed (rash). Apply to buttocks 45 g 2   . DULoxetine HCl 60 MG Oral CAPSULE ENTERIC COATED PARTICLES Take 1 capsule (60 mg) by mouth daily. 90 capsule 3   . Esomeprazole Magnesium 40 MG Oral CAPSULE DELAYED RELEASE Take 1 capsule (40 mg) by mouth 2 times a day. Take on empty stomach 180 capsule 1   . Fluticasone Propionate 50 MCG/ACT Nasal Suspension Spray 1 spray into each nostril 2 times a day. 1 bottle 3   . Glucose Blood (BLOOD GLUCOSE TEST) In Vitro Strip Use 1 strip daily. Use to check blood sugar. 100 strip 1   . Ipratropium-Albuterol 20-100 MCG/ACT Inhalation Aero Soln Inhale 2 puffs  by mouth every 4 hours as needed (cough). 1 Inhaler 11   . LamoTRIgine 100 MG Oral Tab Week 5: 100 mg once daily; Week 6 and maintenance: 200 mg once daily. 180 tablet 2   . LamoTRIgine 25 MG Oral Tab Weeks 1 and 2: 25 mg once daily; Weeks 3 and 4: 50 mg once daily; 45 tablet 0   . Lancets Thin Misc Use 1 each daily. 100 each 1   . Levothyroxine Sodium 88 MCG Oral Tab Take 1 tablet (88 mcg) by mouth daily on an empty stomach. 90 tablet 2   . Losartan Potassium 50 MG Oral Tab One tablet twice daily 60 tablet 1   . Meclizine HCl 25 MG Oral Tab Take 2 tablets (50 mg) by mouth 2 times a day as needed for dizziness or nausea/vomiting. 60 tablet 0   . Meloxicam 15 MG Oral Tab Take 1 tablet (15 mg) by mouth daily. 90 tablet 2   . MetFORMIN HCl ER 750 MG Oral TABLET SR 24 HR Take 1 tablet (750 mg) by mouth daily. 30 tablet 3   . Montelukast Sodium 10 MG Oral Tab take 1 tablet by mouth every evening 90 tablet 3   .  Olopatadine HCl 0.2 % Ophthalmic Solution Place 1 drop in each EYE daily. 2.5 mL 2   . Olopatadine HCl 0.6 % Nasal Solution Spray 2 sprays into each nostril 2 times a day. 1 bottle 11   . Ondansetron 8 MG Oral TABLET DISPERSIBLE Dissolve 1 tablet (8 mg) on top of tongue and swallow every 8 hours as needed for nausea/vomiting. 60 tablet 1   . Pregabalin 225 MG Oral Cap One tablet at dinner and one tablet at bedtime 60 capsule 3   . Respiratory Therapy Supplies (NEBULIZER) Does not apply Device Use every 6 hours as needed for other (chronic bronchitis). Chronic bronchitis 1 Device 0   . ROPINIRole HCl 0.25 MG Oral Tab By mouth, 1 tab (0.25 mg) once daily 1 to 3 hours before bedtime & in 2 days increase to  0.5 mg daily (2 tabs), and after 7 days to 1 mg daily. Dose may be further titrated upward in 0.5 mg increments every week until reaching a daily dose of 3 mg during week 6. Daily dose may be increased to a maximum of 4 mg beginning week 7. 120 tablet 2   . Spacer/Aero-Holding Chambers (AEROCHAMBER Z-STAT PLUS) Misc Use with inhaler as directed. 1 Device 1     No facility-administered medications prior to visit.     Objective    PHYSICAL EXAM:  General: healthy, alert, mildly anxious, alert and oriented x 3  Skin: Skin color, texture, turgor normal. No rashes or concerning lesions  Head: positive findings: positive findings: sinus pressure frontal region bilaterally; TM - dull bilaterally    Neck: negative  Lungs: clear to auscultation  Heart: normal rate, regular rhythm and no murmurs, clicks, or gallops b/p is elevated without medications this morning  Mental: He is more anxious and stressed about health today.     ASSESSMENT AND PLAN:  Strongly suggest checking b/p at home. He will continue medication and if still elevated over 140/90, let me know. We will increase his b/p meds    (I10) Essential hypertension  (primary encounter diagnosis)  Plan: AmLODIPine Besylate 5 MG Oral Tab, REFERRAL TO         CARDIOLOGY,  ECG ROUTINE ECG W/LEAST 12 LDS  W/I&R        EKG was normal    (R42) Dizziness  Plan: REFERRAL TO CARDIOLOGY, ECG ROUTINE ECG W/LEAST        12 LDS W/I&R        See Neurology as planned with MRI on 2/20

## 2015-09-04 NOTE — Patient Instructions (Signed)
It was a pleasure to see you in clinic today.                If you are not yet signed up for eCare, please see the below eCare section at the end of this document for how to enroll and to get your access codes.  It's easy to sign up at home today.    eCare  enrollment will allow you access to the below benefits   You can make appointments online   View test results / Lab Results   Request prescription renewals   Obtain a copy of our After Visit Summary (an electronic copy of this document)     Your Test Results:  If labs were ordered today the results are expected to be available via eCare in about 5 days. If you have an active eCare account, this is how we will notify you of your results.     If you do not have an eCare account then your test results will be mailed to you within about 14 days after your tests are completed. If your physician needs to change your care based on your results or is concerned, you should expect a phone call or eCare message from your provider.    If you have any questions about your test results please schedule an appointment with your provider, so that we may review them with you in greater detail.    **If it has been more than 2 weeks and you have not received your test results please send our office a message via eCare.    Medication Refills: If you need a prescription refilled, please contact your pharmacy 1 week before your current supply will run out to request the refill.  Contacting your pharmacy is the fastest and safest way to obtain a medication refill.  The pharmacy will notify our office.  Please note, that a minimum of 48 to 72 hours is needed to refill a medication,  Please call your pharmacy early to allow enough time to refill before you anticipate running out.  For faster medication refills, you can also schedule an appointment with your provider.    We know you have a choice in where you receive your healthcare and we sincerely thank you for trusting [facility] with your health.

## 2015-09-05 ENCOUNTER — Encounter (INDEPENDENT_AMBULATORY_CARE_PROVIDER_SITE_OTHER): Payer: Self-pay | Admitting: Family

## 2015-09-07 ENCOUNTER — Telehealth (HOSPITAL_BASED_OUTPATIENT_CLINIC_OR_DEPARTMENT_OTHER): Payer: Self-pay | Admitting: Family

## 2015-09-07 ENCOUNTER — Ambulatory Visit (INDEPENDENT_AMBULATORY_CARE_PROVIDER_SITE_OTHER): Payer: BLUE CROSS/BLUE SHIELD | Admitting: Optometrist

## 2015-09-07 DIAGNOSIS — H18599 Other hereditary corneal dystrophies, unspecified eye: Secondary | ICD-10-CM

## 2015-09-07 DIAGNOSIS — H1859 Other hereditary corneal dystrophies: Secondary | ICD-10-CM

## 2015-09-07 DIAGNOSIS — H57052 Tonic pupil, left eye: Secondary | ICD-10-CM | POA: Insufficient documentation

## 2015-09-07 DIAGNOSIS — H538 Other visual disturbances: Secondary | ICD-10-CM

## 2015-09-07 DIAGNOSIS — M459 Ankylosing spondylitis of unspecified sites in spine: Secondary | ICD-10-CM

## 2015-09-07 DIAGNOSIS — E119 Type 2 diabetes mellitus without complications: Secondary | ICD-10-CM

## 2015-09-07 NOTE — Patient Instructions (Addendum)
Diabetes mellitus type 2/ hight blood pressure: no  retinopathy both eyes today.  Continued compliance with medication, healthy dietary choices, and exercise [minimum of 20 min walking daily].    Discussed importance of regular PCP visits, good blood sugar/ blood pressure control and yearly dilated eye exams.  Also educated on the effects of poorly controlled blood sugar for ocular health.    Recommend weekly amsler grid monitoring for central vision; return immediately with changes such as missing lines or wavy/distorted lines.   Schedule for annual diabetic eye exams unless otherwise recommended.    Educated on the relationship between fluctuation in vision and change in refractive error.  Schedule for routine exam/refraction once blood sugar/BP stabilized, recommend in 3 months.       Continue with muro ointment at night for corneal condition.  If needing an eyedrop during the daytime for eye irritation/dryness, use Systane Balance only.  .  Recommend considering transition prescription glasses for next glasses.  .  Encourage wrap around eyewear with proper UV protection for outdoor activity, clear UV coating for habitual specs. Cap/hat with rim also acceptable to block harmful sun rays.  .  Caution with flonase, early posterior lenticular changes noted.  .  Thank you for coming in today.   During today's visit we reviewed only your ophthalmology (eye-related) medications.   Please follow up with your primary care provider for any questions regarding other medications.   If your eyes were dilated during today's visit, the average dilation will last 4 to 6 hours and may impact your overall vision during this period. Please use caution during this period.   If you need to schedule or change a follow up appointment, please call [phone].   Our phone lines are open 7:00am to 8:00pm Monday through Saturdays and 9:00am to 5:30pm on Sundays.

## 2015-09-07 NOTE — Telephone Encounter (Signed)
Clinic name and location: [facility]  Reason for sooner appointment: per patient's request- to address issues r/t high bp as soon as possible  Patient's scheduling limitations: prefers morning appointment  What clinics and/or providers have been offered: all  Does the patient have a location preference ([facility], [facility], [facility] or [facility]): prefers [facility] ([facility]), [facility] ([facility])  What is the best contact number:   Telephone Information:   Home Phone [phone]   Work Phone Not on file.   Mobile [phone]       What is the best time to call back: any  Is it ok to leave a detailed message on your voicemail or with someone: general message    *I apologize, did not connect patient to NPRP while patient is on the phone.    Please assist, thank you.

## 2015-09-07 NOTE — Progress Notes (Signed)
Chief Complaint   Patient presents with   . Diabetes     x recent; referred for evaluation.       History of present illness/ Past relevant history:    NEW [facility].  [facility] 2013.  Glass prescription are 2007.  Recently placed on diabetic and hypertensive medication. Sees PCP routintely/weekly; last visit Monday.    Hx Adies pupil late 1990s OS: unknown reason  Hx Ankylosing Spondylitis 1990s: managed  Hx BMD OD: 2013, hx of soft CLs due to RCE few weeks twice (last episode 2016); muro ung every evening both eyes  Sleep apnea: CPAP x 2004  Denies other eye symptoms.  Vertigo since feb 2017, hx of vestibular neuritis.    Patient history:    DM 2, not controlled.  Duration &/or year diagnosed: recent/ on med X Nov 2016, non-insulin dependent      A1C: high   Lab Results   Component Value Date/Time    HEMOGLOBIN A1C 7.7* 08/09/2015    HEMOGLOBIN A1C 6.4* 04/10/2015    HEMOGLOBIN A1C 6.0 11/14/2014        BP:  High on meds, working with PCP  BP Readings from Last 3 Encounters:   09/04/15 163/97   08/23/15 145/103   08/16/15 159/96       Cholesterol level:  good  Lab Results   Component Value Date/Time    CHOLESTEROL (TOTAL) 197 04/04/2015 11:31 AM    CHOLESTEROL (TOTAL) 186 06/25/2014 11:35 AM    CHOLESTEROL (TOTAL) 139 11/12/2013 09:36 AM         ROS:   Is pain part of the reason you came to clinic today? No   Wears glasses: yes  Wears contact lenses:No Date of last prescription: NA  Eye symptoms other than listed above: none reported.    Review of Systems: Negative for change from prior, states everything same as 09/05/15 visit with PCP.     Family ocular history updated if relevant.    "Today's intake form reviewed and confirmed with the patient, scanned if relevant."  Patient medical hx, family history, ocular history, medications were reviewed.   During exam review of eye-related medications completed.  Patient will need to  follow up with their primary care provider for any questions  regarding other medications.      PHYSICAL EXAM    Neurological: Alert & Oriented x 3, affect/mood normal     Constitutional: Appears healthy  Psychiatric: Mood normal   Neurologic: Face is symmetric   Skin: Facial rashes: No   Head: Atraumatic   ENT: External ears and nose are normal in appearance.  Respiratory: normal respiratory effort    Eyes: See Eye Exam    A/P:  1. Type 2 diabetes mellitus without complication, without long-term current use of insulin (HCC).. Also has HTN still not well managed.   Plan: Continued compliance with medication, healthy dietary choices, and exercise [minimum of 20 min walking daily].  Patient aware on the importance of regular PCP visits, good blood sugar/ blood pressure control and yearly dilated eye exams.  Recommend weekly amsler grid monitoring for central vision. Also educated on the effects of poorly controlled blood sugar for ocular health. .   Schedule for annual diabetic eye exams unless otherwise recommended.      2. 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 Ankylosing spondylitis (HCC)... States no prior ocular issues secondary to AS; no signs of uveitis today,no dry eye symptoms reported  Plan: advised on systane balance eyedrop if needed for dryness; return with ocular symptoms     5. Blurry vision... Functional with habitual specs; pinholes to 20/20 OD, OS  Plan: reasses in few months when meds (dose/type) and BP/BS stablized

## 2015-09-15 ENCOUNTER — Telehealth (INDEPENDENT_AMBULATORY_CARE_PROVIDER_SITE_OTHER): Payer: Self-pay | Admitting: Psychiatry

## 2015-09-15 NOTE — Telephone Encounter (Signed)
(  TEXTING IS AN OPTION FOR [facility] CLINICS ONLY)  Is this a [facility] clinic? Yes. What is the mobile number we can use to get a hold of you via text? [phone]      RETURN CALL: OK to leave detailed message with anyone that answers      SUBJECT:  Appointment Request     REASON FOR REQUEST/SYMPTOMS: Follow Up  REFERRING PROVIDER: na  REQUEST APPOINTMENT WITH: Dr. Marianne Sofiaoor  REQUESTED DATE: first available, TIME: Morning  UNABLE TO APPOINT BECAUSE: per SOP, Send TE P [facility] [place] [facility]ICAL SUPPORT POOL [[id number]]

## 2015-09-16 ENCOUNTER — Other Ambulatory Visit (INDEPENDENT_AMBULATORY_CARE_PROVIDER_SITE_OTHER): Payer: Self-pay | Admitting: Family

## 2015-09-16 DIAGNOSIS — E119 Type 2 diabetes mellitus without complications: Secondary | ICD-10-CM

## 2015-09-18 ENCOUNTER — Ambulatory Visit: Payer: BLUE CROSS/BLUE SHIELD | Attending: Family | Admitting: Cardiovascular Disease

## 2015-09-18 ENCOUNTER — Encounter (INDEPENDENT_AMBULATORY_CARE_PROVIDER_SITE_OTHER): Payer: Self-pay | Admitting: Cardiovascular Disease

## 2015-09-18 ENCOUNTER — Telehealth (INDEPENDENT_AMBULATORY_CARE_PROVIDER_SITE_OTHER): Payer: Self-pay | Admitting: Family

## 2015-09-18 VITALS — BP 151/95 | HR 96 | Resp 18 | Wt 253.8 lb

## 2015-09-18 DIAGNOSIS — G4733 Obstructive sleep apnea (adult) (pediatric): Secondary | ICD-10-CM

## 2015-09-18 DIAGNOSIS — R42 Dizziness and giddiness: Secondary | ICD-10-CM

## 2015-09-18 DIAGNOSIS — I10 Essential (primary) hypertension: Secondary | ICD-10-CM

## 2015-09-18 MED ORDER — AMLODIPINE BESYLATE 10 MG OR TABS
ORAL_TABLET | ORAL | Status: DC
Start: 2015-09-18 — End: 2019-09-22

## 2015-09-18 NOTE — Progress Notes (Signed)
[employer] [facility] INITIAL EVALUATION NOTE       Primary Care Provider:  Rocky Crafts, ARNP  Referring Provider:  Rocky Crafts, [place] REFERRAL    Chief Complaint   Patient presents with   . Hypertension   . Dizziness       HISTORY OF PRESENT ILLNESS:  50 yo man with difficult to control hypertension and episodes of dizziness referred for evaluation of hypertension.  Patient has had elevated blood pressure for several years.  He began on medications 04/2015.  Over time his blood pressure has been progressively worsening despite increasing antihypertensive medications.  He comes to clinic today with his blood pressure log which shows blood pressures in the 160-180 range. Additionally, he complains of significant dizziness with position changes and rapid movement of his head.  This dizziness is so severe that he has fallen over as a result.  He also reports associated nausea and vomiting.  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  He does not routinely exercise.  He is able to walk without limitation but per his report he walks at a slow pace.  Able to climb stairs without major limitation.  He has sleep apnea and uses CPAP.      Patient denies chest pain, dyspnea on exertion, orthopnea, PND, edema, palpitations, and syncope.    Past Medical History   Diagnosis Date   . Ankylosing spondylitis ([place])    . Esophageal reflux    . Allergic rhinitis due to other allergen    . Unspecified sleep apnea    . Restless legs syndrome (RLS)    . Hypertension 02/16/2012   . Hypertriglyceridemia 04/06/2015   . Depressive disorder, not elsewhere classified 12/16/2008   . Allergic rhinitis, cause unspecified 01/19/2009   . Chronic pain 02/16/2012   . Hypothyroidism 09/11/2012   . Sleep apnea 08/20/2010   . Asthma 07/11/2013       Past Surgical History   Procedure Laterality Date   . Cholecystectomy     . Unlisted procedure shoulder       left shoulder   . Esophagogastroduodenoscopy transoral  diagnostic  2008     on prevacid   . Colonoscopy stoma dx including collj spec spx  2008     normal per patient       Family History   Problem Relation Age of Onset   . Heart Attack Maternal Grandfather    . Diabetes Mother    . Hypertension Mother    . Hypertension Father    . Pancreatic Cancer Paternal Grandfather        Social History     Social History   . Marital Status: Married     Spouse Name: N/A   . Number of Children: N/A   . Years of Education: N/A     Social History Main Topics   . Smoking status: Former Research scientist (life sciences)   . Smokeless tobacco: Never Used   . Alcohol Use: No   . Drug Use: No   . Sexual Activity: Not Asked     Other Topics Concern   . None     Social History Narrative    Recently moved from [place].  Works for [employer].  One daughter.  Sister is an Therapist, sports.         02/15/12    Lives with his wife.    Daughter is studious     Son has been diagnosed with mild  asperger's syndrome.    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 Morphine Nausea/Vomiting   . Penicillins Rash       Medications:  Current Outpatient Prescriptions   Medication Sig Dispense Refill   . AmLODIPine Besylate 10 MG Oral Tab One tablet every am 30 tablet 11   . Atorvastatin Calcium 20 MG Oral Tab Take 1 tablet (20 mg) by mouth daily. To lower Cholesterol. 90 tablet 1   . Benzonatate 200 MG Oral Cap Take 1 capsule (200 mg) by mouth 3 times a day as needed for cough. 30 capsule 0   . Blood Glucose Monitoring Suppl Kit Use to check blood sugar once daily 1 kit 0   . Budesonide 0.5 MG/2ML Inhalation Suspension Inhale 2 mL (0.5 mg) via nebulizer every 12 hours. 120 mL 1   . Certolizumab Pegol (CIMZIA PREFILLED) 2 X 200 MG/ML Subcutaneous Kit Inject 200 mg under the skin every 2 weeks.     . ClonazePAM 0.5 MG Oral Tab Take 1/2 to 1 tablet twice daily as needed 10 tablet 0   . Clotrimazole-Betamethasone 1-0.05 % External Cream Apply 1 application  topically 2 times a day as needed (rash). Apply to buttocks 45 g 2   . DULoxetine HCl 60 MG Oral CAPSULE ENTERIC COATED PARTICLES Take 1 capsule (60 mg) by mouth daily. 90 capsule 3   . Esomeprazole Magnesium 40 MG Oral CAPSULE DELAYED RELEASE Take 1 capsule (40 mg) by mouth 2 times a day. Take on empty stomach 180 capsule 1   . Fluticasone Propionate 50 MCG/ACT Nasal Suspension Spray 1 spray into each nostril 2 times a day. 1 bottle 3   . Glucose Blood (BLOOD GLUCOSE TEST) In Vitro Strip Use 1 strip daily. Use to check blood sugar. 100 strip 1   . Ipratropium-Albuterol 20-100 MCG/ACT Inhalation Aero Soln Inhale 2 puffs by mouth every 4 hours as needed (cough). 1 Inhaler 11   . Lancets Thin Misc Use 1 each daily. 100 each 1   . Levothyroxine Sodium 88 MCG Oral Tab Take 1 tablet (88 mcg) by mouth daily on an empty stomach. 90 tablet 2   . Losartan Potassium 50 MG Oral Tab One tablet twice daily 60 tablet 1   . Meclizine HCl 25 MG Oral Tab Take 2 tablets (50 mg) by mouth 2 times a day as needed for dizziness or nausea/vomiting. 60 tablet 0   . Meloxicam 15 MG Oral Tab Take 1 tablet (15 mg) by mouth daily. 90 tablet 2   . MetFORMIN HCl ER 750 MG Oral TABLET SR 24 HR Take 1 tablet (750 mg) by mouth daily. 30 tablet 3   . Montelukast Sodium 10 MG Oral Tab take 1 tablet by mouth every evening 90 tablet 3   . Olopatadine HCl 0.2 % Ophthalmic Solution Place 1 drop in each EYE daily. 2.5 mL 2   . Olopatadine HCl 0.6 % Nasal Solution Spray 2 sprays into each nostril 2 times a day. 1 bottle 11   . Ondansetron 8 MG Oral TABLET DISPERSIBLE Dissolve 1 tablet (8 mg) on top of tongue and swallow every 8 hours as needed for nausea/vomiting. 60 tablet 1   . Pregabalin 225 MG Oral Cap One tablet at dinner and one tablet at bedtime 60 capsule 3   . Respiratory Therapy Supplies (NEBULIZER) Does not apply Device Use  every 6 hours as needed for other (chronic bronchitis). Chronic bronchitis 1 Device 0   . ROPINIRole HCl 0.25 MG Oral Tab  By mouth, 1 tab (0.25 mg) once daily 1 to 3 hours before bedtime & in 2 days increase to  0.5 mg daily (2 tabs), and after 7 days to 1 mg daily. Dose may be further titrated upward in 0.5 mg increments every week until reaching a daily dose of 3 mg during week 6. Daily dose may be increased to a maximum of 4 mg beginning week 7. 120 tablet 2   . Spacer/Aero-Holding Chambers (AEROCHAMBER Z-STAT PLUS) Misc Use with inhaler as directed. 1 Device 1     No current facility-administered medications for this visit.       Review of Systems:  As per HPI. Additionally fatigue, tinnitus, asthma, nausea, reflux, joint pain, balance problems, excessive thirst, excessive hunger.     Otherwise complete review of systems is negative    Physical Exam  Vital Signs:  BP 151/95 mmHg  Pulse 96  Resp 18  Wt 253 lb 12.8 oz (115.123 kg)  SpO2 98%    CONSTITUTIONAL: In NAD  HEENT: Normocephalic, atraumatic, no scleral icterus or conjunctival pallor, good dentition, no erythema or exudate of oropharynx  Neck: supple with normal thyroid size  CV: RRR with normal S1 and S2, no S3 or S4, no murmurs or rubs, Nondisplaced PMI, carotid volume is full with brisk upstroke. Radial, femoral and DP pulses are 2+. No edema, JVP 6 cmH2O.  RESP: Clear to auscultation x2 no rales  GI: +BS, soft, nontender, nondistended  MUSCULOSKELETAL: no muscle atrophy or acute bony abnormality  HEME/LYMPY/IMMUNOLOGIC: no lymphadenopathy  SKIN: warm, dry, free of rash or ulceration  NEURO: AOx3, normal gait, full affect, with good insight into medical condition    Laboratory Studies:  Results for orders placed or performed in visit on 08/23/15   COMPREHENSIVE METABOLIC PANEL   Result Value Ref Range    Sodium 136 135 - 145 meq/L    Potassium 3.6 3.6 - 5.2 meq/L    Chloride 100 98 - 108 meq/L    Carbon Dioxide, Total 27 22 - 32 meq/L    Anion Gap 9 4 - 12    Glucose 285 (H) 62 - 125 mg/dL    Urea Nitrogen 12 8 - 21 mg/dL    Creatinine 0.95 0.51 - 1.18 mg/dL    Protein  (Total) 6.8 6.0 - 8.2 g/dL    Albumin 4.3 3.5 - 5.2 g/dL    Bilirubin (Total) 1.1 0.2 - 1.3 mg/dL    Calcium 9.2 8.9 - 10.2 mg/dL    AST (GOT) 70 (H) 9 - 38 U/L    Alkaline Phosphatase (Total) 92 39 - 139 U/L    ALT (GPT) 91 (H) 10 - 48 U/L    GFR, Calc, European American >60 mL/min/[1.73_m2]    GFR, Calc, African American >60 mL/min/[1.73_m2]    GFR, Information       Calculated GFR in mL/min/1.73 m2 by MDRD equation.  Inaccurate with changing renal function.  See http://[email or web address]   LAMOTRIGINE   Result Value Ref Range    Lamotrigine 2.4 2.0 - 20.0 ug/mL       Lipids  CHOLESTEROL (HDL)   Date Value Ref Range Status   04/04/2015 25* >39 mg/dL Final     CHOLESTEROL (TOTAL)   Date Value Ref Range Status   04/04/2015 197 <200 mg/dL Final     CHOLESTEROL (LDL)  Date Value Ref Range Status   04/04/2015  <130 mg/dL Final    LDL can not be calculated when Triglycerides are greater than 400. If LDL quantification is needed please order a Lipoprotein Quantification (Beta Quant).         A1C  HEMOGLOBIN A1C (%)   Date Value   08/09/2015 7.7*       TSH  THYROID STIMULATING HORMONE   Date Value Ref Range Status   04/04/2015 2.514 0.400 - 5.000 u[IU]/mL Final     TSH WITH REFLEXIVE FREE T4   Date Value Ref Range Status   08/09/2015 2.002 0.400 - 5.000 u[IU]/mL Final       Cardiac Studies:  ECG (09/04/2015): I personally reviewed tracing.  Normal sinus rhythm.     Assessment / Plan:   50 yo man with difficult to control blood pressure and episodes of dizziness.      Hypertension - blood pressure is poorly controlled in clinic today and on home BP log.  I will increase his amlodipine dose.  He is maximized on losartan.  On review of labs he has a borderline low potassium which can be seen in hyperaldosteronism.  I will check renin aldosterone levels.  Will hold off starting spironolactone until workup is complete, but patient may benefit.  He is treated for OSA and has recent TSH in normal  range.    - echo  - renin aldosterone  - plasma and urine metanephrines  - increase amlodipine    Dizziness - patient description is most consistent with vertigo.  He has not been orthostatic when checked.  Patient with no signs or symptoms to suggest arrhythmia as cause.  Echocardiogram as above to further assess risk.      OSA - encouraged patient to continue adherence to CPAP    Dyslipidemia - patient with low HDL when checked.  He is currently on low dose atorvastatin.  Patient will have fasting labs checked.      Author:  Lendon Ka, MD  Attending Physician, Division of [facility]  [facility]  [place], [place]

## 2015-09-18 NOTE — Telephone Encounter (Signed)
.  Received a FMLA  form via right fax. Form [facility] to complete    Main contact for form if there are questions/concerns: [employer]/Arnella  Phone #: [phone]  Fax # [phone]  Please route back to the [facility] [employer]k Pool

## 2015-09-18 NOTE — Telephone Encounter (Signed)
No action has taken place since 09/15/15 when forwarded to both [facility] and [facility] Pool.     Please advise.

## 2015-09-19 ENCOUNTER — Telehealth (INDEPENDENT_AMBULATORY_CARE_PROVIDER_SITE_OTHER): Payer: Self-pay | Admitting: Family

## 2015-09-19 ENCOUNTER — Encounter (INDEPENDENT_AMBULATORY_CARE_PROVIDER_SITE_OTHER): Payer: BLUE CROSS/BLUE SHIELD | Admitting: Allergy/Immunology

## 2015-09-19 NOTE — Telephone Encounter (Signed)
CONFIRMED PHONE NUMBER: [phone]  CALLERS FIRST AND LAST NAME: Arnella  FACILITY NAME: [employer] TITLE: Short Term Disability case manager   CALLERS RELATIONSHIP:OTHER:   RETURN CALL: OK to leave detailed message with anyone that answers     [facility] ONLY) Texting is an option for this clinic. If you would like us to use this option, which mobile phone number should we text to if we are unable to reach you?    SUBJECT: General Message     REASON FOR REQUEST: Replying to e-mail that was sent       MESSAGE: Arnella states when calling, when prompted to enter a SSN to state "representative calling from provider's office".

## 2015-09-19 NOTE — Telephone Encounter (Signed)
Per PCP Fonnie MuKerry Meyer patient needs an ongoing Psychiatrist

## 2015-09-19 NOTE — Telephone Encounter (Signed)
Left message for patient to return call in regards to appointment request with Dr.Toor

## 2015-09-20 NOTE — Telephone Encounter (Signed)
Added below information to FMLA TE.     Closing this TE.

## 2015-09-20 NOTE — Telephone Encounter (Signed)
CONFIRMED PHONE NUMBER: [phone]  CALLERS FIRST AND LAST NAME: Arnella  FACILITY NAME: [employer] TITLE: Short Term Disability case manager   CALLERS RELATIONSHIP:OTHER:   RETURN CALL: OK to leave detailed message with anyone that answers     ([facility] ONLY) Texting is an option for this clinic. If you would like us to use this option, which mobile phone number should we text to if we are unable to reach you?    SUBJECT: General Message     REASON FOR REQUEST: Replying to e-mail that was sent       MESSAGE: Arnella states when calling, when prompted to enter a SSN to state "representative calling from provider's office".

## 2015-09-20 NOTE — Telephone Encounter (Signed)
Fonnie MuKerry Meyer, please discuss psychiatry with Pt at upcoming 09/22/15 appointment, thank you.

## 2015-09-22 ENCOUNTER — Ambulatory Visit (INDEPENDENT_AMBULATORY_CARE_PROVIDER_SITE_OTHER): Payer: BLUE CROSS/BLUE SHIELD

## 2015-09-22 ENCOUNTER — Ambulatory Visit (INDEPENDENT_AMBULATORY_CARE_PROVIDER_SITE_OTHER): Payer: BLUE CROSS/BLUE SHIELD | Admitting: Family

## 2015-09-22 ENCOUNTER — Other Ambulatory Visit
Admit: 2015-09-22 | Discharge: 2015-09-22 | Disposition: A | Discharge status: Home / Self Care | Payer: BLUE CROSS/BLUE SHIELD | Attending: Cardiovascular Disease | Admitting: Cardiovascular Disease

## 2015-09-22 ENCOUNTER — Encounter (INDEPENDENT_AMBULATORY_CARE_PROVIDER_SITE_OTHER): Payer: Self-pay | Admitting: Cardiovascular Disease

## 2015-09-22 ENCOUNTER — Other Ambulatory Visit (INDEPENDENT_AMBULATORY_CARE_PROVIDER_SITE_OTHER): Payer: BLUE CROSS/BLUE SHIELD

## 2015-09-22 ENCOUNTER — Encounter (INDEPENDENT_AMBULATORY_CARE_PROVIDER_SITE_OTHER): Payer: Self-pay | Admitting: Family

## 2015-09-22 ENCOUNTER — Telehealth (INDEPENDENT_AMBULATORY_CARE_PROVIDER_SITE_OTHER): Payer: Self-pay | Admitting: Family

## 2015-09-22 VITALS — BP 142/86 | HR 92 | Temp 98.1°F | Resp 16 | Ht 70.0 in | Wt 255.0 lb

## 2015-09-22 DIAGNOSIS — R42 Dizziness and giddiness: Secondary | ICD-10-CM | POA: Insufficient documentation

## 2015-09-22 DIAGNOSIS — I10 Essential (primary) hypertension: Secondary | ICD-10-CM

## 2015-09-22 LAB — CBC (HEMOGRAM)
Hematocrit: 45 % (ref 38–50)
Hemoglobin: 15.5 g/dL (ref 13.0–18.0)
MCH: 30.5 pg (ref 27.3–33.6)
MCHC: 34.8 g/dL (ref 32.2–36.5)
MCV: 88 fL (ref 81–98)
Platelet Count: 190 10*3/uL (ref 150–400)
RBC: 5.08 10*6/uL (ref 4.40–5.60)
RDW-CV: 11.5 % — ABNORMAL LOW (ref 11.6–14.4)
WBC: 8.09 10*3/uL (ref 4.3–10.0)

## 2015-09-22 LAB — BASIC METABOLIC PANEL
Anion Gap: 10 (ref 4–12)
Calcium: 9 mg/dL (ref 8.9–10.2)
Carbon Dioxide, Total: 28 meq/L (ref 22–32)
Chloride: 103 meq/L (ref 98–108)
Creatinine: 0.81 mg/dL (ref 0.51–1.18)
GFR, Calc, African American: >60 mL/min/{1.73_m2}
GFR, Calc, European American: >60 mL/min/{1.73_m2}
Glucose: 114 mg/dL (ref 62–125)
Potassium: 3.6 meq/L (ref 3.6–5.2)
Sodium: 141 meq/L (ref 135–145)
Urea Nitrogen: 12 mg/dL (ref 8–21)

## 2015-09-22 LAB — HEPATIC FUNCTION PANEL
ALT (GPT): 62 U/L — ABNORMAL HIGH (ref 10–48)
AST (GOT): 29 U/L (ref 9–38)
Albumin: 4.1 g/dL (ref 3.5–5.2)
Alkaline Phosphatase (Total): 105 U/L (ref 39–139)
Bilirubin (Direct): 0.2 mg/dL (ref 0.0–0.3)
Bilirubin (Total): 0.8 mg/dL (ref 0.2–1.3)
Protein (Total): 6.3 g/dL (ref 6.0–8.2)

## 2015-09-22 LAB — LIPID PANEL
Cholesterol (LDL): 70 mg/dL (ref <130)
Cholesterol/HDL Ratio: 4.2
HDL Cholesterol: 31 mg/dL — ABNORMAL LOW (ref >39)
Non-HDL Cholesterol: 98 mg/dL (ref 0–159)
Total Cholesterol: 129 mg/dL (ref <200)
Triglyceride: 141 mg/dL (ref <150)

## 2015-09-22 NOTE — Progress Notes (Signed)
Reviewed eCare status with Patient:  YES    HEALTH MAINTENANCE:  Has the patient had any of these since their last visit?    Cervical screening/PAP: Not Due     Mammo: Not Due    Colon Screen: Not Due    Diabetic Eye Exam: Not Due      Have you seen a specialist since your last visit: No        HM Due:   Health Maintenance   Topic Date Due   . A1c  02/06/2016   . Diabetes Foot Exam  06/06/2016   . Colon Cancer Screen w/ FOBT/FIT  08/14/2016   . Diabetes Eye Exam  09/06/2016   . Diabetes Screen  08/23/2018   . Cholesterol Test  04/03/2020   . Tetanus Vaccine  06/25/2024   . Influenza Vaccine  Addressed   . Hepatitis C Screen  Addressed   . HIV Screen  Addressed           Future Appointments  Date Time Provider Department Center   09/22/2015 11:20 AM [name], [name], ARNP [facility]   10/04/2015 9:45 AM ESC ECHO [facility]   10/23/2015 4:30 PM Bing MatterWood, Gregory Allen, [facility]   01/01/2016 8:30 AM Ayars, Lina SayreAndrew Garrison, MD [facility]

## 2015-09-22 NOTE — Patient Instructions (Signed)
It was a pleasure to see you in clinic today.                If you are not yet signed up for eCare, please see the below eCare section at the end of this document for how to enroll and to get your access codes.  It's easy to sign up at home today.    eCare  enrollment will allow you access to the below benefits   You can make appointments online   View test results / Lab Results   Request prescription renewals   Obtain a copy of our After Visit Summary (an electronic copy of this document)     Your Test Results:  If labs were ordered today the results are expected to be available via eCare in about 5 days. If you have an active eCare account, this is how we will notify you of your results.     If you do not have an eCare account then your test results will be mailed to you within about 14 days after your tests are completed. If your physician needs to change your care based on your results or is concerned, you should expect a phone call or eCare message from your provider.    If you have any questions about your test results please schedule an appointment with your provider, so that we may review them with you in greater detail.    **If it has been more than 2 weeks and you have not received your test results please send our office a message via eCare.    Medication Refills: If you need a prescription refilled, please contact your pharmacy 1 week before your current supply will run out to request the refill.  Contacting your pharmacy is the fastest and safest way to obtain a medication refill.  The pharmacy will notify our office.  Please note, that a minimum of 48 to 72 hours is needed to refill a medication,  Please call your pharmacy early to allow enough time to refill before you anticipate running out.  For faster medication refills, you can also schedule an appointment with your provider.    We know you have a choice in where you receive your healthcare and we sincerely thank you for trusting [facility] with your health.

## 2015-09-22 NOTE — Progress Notes (Signed)
Care Management Visit    Reason for visit:  Follow up diabetes, hypertension - initial visit    Subjective:  Pt reports he continues his healthy eating, he and his wife prepare food ahead for the week on Sundays.  He is also beginning to incorporate walking into his daily routine.  He uses the health app on his iPhone as well as is starting to use the "My Fitness Pal" to help track steps/day and food intake.  He especially likes the ability to scan bar codes into the My Fitness App from nutritional offerings.  His step count is currently varying between 5,000-10,000 steps/day.  He also intends to begin walking with his wife on the weekends  Pt expresses frustration regarding how his lifestyle modifications haven't affected his blood pressure readings.  He states he checks his blood sugars and blood pressure every morning.  He reports he had blood work drawn today per Dr. Harriett RushWood's recommendations per his 09/18/15 visit.    Objective:    A1C [facility] AMB Latest Ref Rng 04/10/2015 08/09/2015   A1C 4.0 - 6.0 % 6.4 (H) 7.7 (H)     Vitals 08/16/2015 08/23/2015 09/04/2015 09/18/2015 09/22/2015   Height 5\' 10"  5\' 10"  5\' 10"   5\' 10"    Weight 260 lb 249 lb 253 lb 253 lb 12.8 oz 255 lb   BMI (kg/m2) 37.31 35.73 36.3  36.59   Temp 99.1 98 98.7  98.1   Systolic 159 145 [id number] 151 146   Diastolic 96 103 97 95 90     Vitals 09/22/2015   Height    Weight    BMI (kg/m2)    Temp    Systolic 142   Diastolic 86     PAM survey completed, Medication Reconciliation done    Assessment: Hypertension and uncontrolled diabetes    Interventions:  Commended patient on efforts toward diabetes and hypertension self-management  Education topics/tools reviewed:  Yes, Diet and exercise and effects on blood pressure, blood sugars  Self-management tools reviewed:  None   Referrals:  None    Care Management Plan:  Pt to continue to incorporate walking and increasing steps/day.  Will touch base with patient in 1 week to assess progress/clinical status        Care  Management Intake Visit    See General Care Management Assessment form    Patient Preferences:  Pt lives with his wife and 2 grown children.  Daughter is 52 years old, son is 735 years old.  Patient states he is happy with this situation  Personal Characteristics:  "My family is very important to me,"    Assessment:  As above    Interventions:  As above  Education topics/tools reviewed:  Yes, As above  Self-management tools reviewed:  None   Referrals:  None    Care Management Plan:  As above    Specific Activity to Improve:    Increasing steps/day -?5000  Frequency:          Seven times/week  Patient Reported Barriers:       self motivation  Confidence Rating:         5   Patient Supports:        Family and iPhone apps

## 2015-09-22 NOTE — Progress Notes (Signed)
CC:  Here to recheck for dizziness.  Has resolved. Returned to work on 3/20. Saw eye specialist and was cleared. Saw Cardiologist and additional tests have been ordered. He is still finding b/p is elevated but improved.  He is working on improving activity and nutrition. Has a goal of weight loss ~ one pound per week. Sleep adequate with CPAP. Feels greatly improved off the lamictal. Denies depression or anxiety now.    ROS:   No further dizziness or tinnitus  Appetite improved  No headaches    Outpatient Prescriptions Prior to Visit   Medication Sig Dispense Refill   . AmLODIPine Besylate 10 MG Oral Tab One tablet every am 30 tablet 11   . Atorvastatin Calcium 20 MG Oral Tab Take 1 tablet (20 mg) by mouth daily. To lower Cholesterol. 90 tablet 1   . Benzonatate 200 MG Oral Cap Take 1 capsule (200 mg) by mouth 3 times a day as needed for cough. 30 capsule 0   . Blood Glucose Monitoring Suppl Kit Use to check blood sugar once daily 1 kit 0   . Budesonide 0.5 MG/2ML Inhalation Suspension Inhale 2 mL (0.5 mg) via nebulizer every 12 hours. 120 mL 1   . Certolizumab Pegol (CIMZIA PREFILLED) 2 X 200 MG/ML Subcutaneous Kit Inject 200 mg under the skin every 2 weeks.     . ClonazePAM 0.5 MG Oral Tab Take 1/2 to 1 tablet twice daily as needed 10 tablet 0   . Clotrimazole-Betamethasone 1-0.05 % External Cream Apply 1 application topically 2 times a day as needed (rash). Apply to buttocks 45 g 2   . DULoxetine HCl 60 MG Oral CAPSULE ENTERIC COATED PARTICLES Take 1 capsule (60 mg) by mouth daily. 90 capsule 3   . Esomeprazole Magnesium 40 MG Oral CAPSULE DELAYED RELEASE Take 1 capsule (40 mg) by mouth 2 times a day. Take on empty stomach 180 capsule 1   . Fluticasone Propionate 50 MCG/ACT Nasal Suspension Spray 1 spray into each nostril 2 times a day. 1 bottle 3   . Glucose Blood (BLOOD GLUCOSE TEST) In Vitro Strip Use 1 strip daily. Use to check blood sugar. 100 strip 1   . Ipratropium-Albuterol 20-100 MCG/ACT Inhalation Aero  Soln Inhale 2 puffs by mouth every 4 hours as needed (cough). 1 Inhaler 11   . LamoTRIgine 100 MG Oral Tab Week 5: 100 mg once daily; Week 6 and maintenance: 200 mg once daily. 180 tablet 2   . LamoTRIgine 25 MG Oral Tab Weeks 1 and 2: 25 mg once daily; Weeks 3 and 4: 50 mg once daily; 45 tablet 0   . Lancets Thin Misc Use 1 each daily. 100 each 1   . Levothyroxine Sodium 88 MCG Oral Tab Take 1 tablet (88 mcg) by mouth daily on an empty stomach. 90 tablet 2   . Losartan Potassium 50 MG Oral Tab One tablet twice daily 60 tablet 1   . Meclizine HCl 25 MG Oral Tab Take 2 tablets (50 mg) by mouth 2 times a day as needed for dizziness or nausea/vomiting. 60 tablet 0   . Meloxicam 15 MG Oral Tab Take 1 tablet (15 mg) by mouth daily. 90 tablet 2   . MetFORMIN HCl ER 750 MG Oral TABLET SR 24 HR Take 1 tablet (750 mg) by mouth daily. 30 tablet 3   . Montelukast Sodium 10 MG Oral Tab take 1 tablet by mouth every evening 90 tablet 3   . Olopatadine HCl 0.2 %  Ophthalmic Solution Place 1 drop in each EYE daily. 2.5 mL 2   . Olopatadine HCl 0.6 % Nasal Solution Spray 2 sprays into each nostril 2 times a day. 1 bottle 11   . Ondansetron 8 MG Oral TABLET DISPERSIBLE Dissolve 1 tablet (8 mg) on top of tongue and swallow every 8 hours as needed for nausea/vomiting. 60 tablet 1   . Pregabalin 225 MG Oral Cap One tablet at dinner and one tablet at bedtime 60 capsule 3   . Respiratory Therapy Supplies (NEBULIZER) Does not apply Device Use every 6 hours as needed for other (chronic bronchitis). Chronic bronchitis 1 Device 0   . ROPINIRole HCl 0.25 MG Oral Tab By mouth, 1 tab (0.25 mg) once daily 1 to 3 hours before bedtime & in 2 days increase to  0.5 mg daily (2 tabs), and after 7 days to 1 mg daily. Dose may be further titrated upward in 0.5 mg increments every week until reaching a daily dose of 3 mg during week 6. Daily dose may be increased to a maximum of 4 mg beginning week 7. 120 tablet 2   . Spacer/Aero-Holding Chambers  (AEROCHAMBER Z-STAT PLUS) Misc Use with inhaler as directed. 1 Device 1     No facility-administered medications prior to visit.     IMPRESSION    Dizziness appears to be correlated with Lamictal.    PLAN  Continue to see specialist  Reviewed b/p readings and will fax to Cardiology  No further paper work completed for [employer]  Continue improved nutrition with decreased calories of 1500 per day  Increase cardiovascular exercise to 30 minutes per day  See Cardiology in follow up visit  If internet addiction, anxiety or depression or mood swings reoccur, call   Meanwhile locate psychiatrist in community for ongoing care

## 2015-09-22 NOTE — Progress Notes (Signed)
Court[facility]    Tests drawn: HFPA, CBC, LIPID, BMP, PLMET, RARRG

## 2015-09-22 NOTE — Telephone Encounter (Signed)
Left message for Mr. Vantassell regarding his FMLA.    Spoke with his case Production designer, theatre/television/filmmanager.    Paper work was received and I approved leave through 3/15 to return on 3/16. He saw his eye specialist at 9 am on 3/16 and was to return to work. He did not return until 3/20.  I let case manager know that I did not have medical justification for this day.   He may not be into clinic as I let him know there was no further paper work to complete.

## 2015-09-24 ENCOUNTER — Other Ambulatory Visit
Admit: 2015-09-24 | Discharge: 2015-09-24 | Disposition: A | Discharge status: Home / Self Care | Payer: BLUE CROSS/BLUE SHIELD | Attending: Cardiovascular Disease | Admitting: Cardiovascular Disease

## 2015-09-24 DIAGNOSIS — I10 Essential (primary) hypertension: Secondary | ICD-10-CM | POA: Insufficient documentation

## 2015-09-25 ENCOUNTER — Other Ambulatory Visit (INDEPENDENT_AMBULATORY_CARE_PROVIDER_SITE_OTHER): Payer: Self-pay | Admitting: Family

## 2015-09-25 ENCOUNTER — Telehealth (INDEPENDENT_AMBULATORY_CARE_PROVIDER_SITE_OTHER): Payer: Self-pay | Admitting: Cardiovascular Disease

## 2015-09-25 ENCOUNTER — Other Ambulatory Visit (INDEPENDENT_AMBULATORY_CARE_PROVIDER_SITE_OTHER): Payer: BLUE CROSS/BLUE SHIELD

## 2015-09-25 DIAGNOSIS — E119 Type 2 diabetes mellitus without complications: Secondary | ICD-10-CM | POA: Insufficient documentation

## 2015-09-25 LAB — PLASMA METANEPHRINES
Metanephrine: <0.2 nmol/L (ref 0.00–0.49)
Normetanephrine: 0.21 nmol/L (ref 0.00–0.89)

## 2015-09-25 NOTE — Telephone Encounter (Signed)
[name] called and asked for a message to be sent to Dr. Lucretia RoersWood regarding his medication. Per pt "I saw Dr. Lucretia RoersWood on Monday... My blood pressure medication was changed... He said that it would take about a week for any changes to occur however nothing has changed, my blood pressure has not gone down at all... I was told that I should call if there are no changes...". Pt was advised that [facility] medical staff will call to discuss, also provided phone number to [facility].

## 2015-09-25 NOTE — Progress Notes (Signed)
Metanephrine urine collection for Dr. Davis GourdGregory Wood

## 2015-09-26 ENCOUNTER — Encounter (INDEPENDENT_AMBULATORY_CARE_PROVIDER_SITE_OTHER): Payer: Self-pay | Admitting: Family

## 2015-09-26 DIAGNOSIS — I10 Essential (primary) hypertension: Secondary | ICD-10-CM

## 2015-09-26 MED ORDER — LOSARTAN POTASSIUM 50 MG OR TABS
ORAL_TABLET | ORAL | Status: DC
Start: 2015-09-26 — End: 2016-05-06

## 2015-09-26 NOTE — Telephone Encounter (Signed)
See eCare and advise on medication, pharmacy loaded.     Medication Refill Documentation  Prescribing Provider Fonnie MuKerry Meyer, ARNP  Date last prescribed 06/14/15  No future appointments   Last OV: 09/12/15    **If patient should still have available refills/supply - please contact patient to review before routing to provider.

## 2015-09-26 NOTE — Telephone Encounter (Signed)
I am waiting for Mr. Arnes' blood work to result before starting any new medications.  I expect his lab test to result by the end of the week.  I will likely add a medication by Monday next week.  Please ask Mr Jennefer Bravorens to continue his blood pressure log.  Thank You

## 2015-09-26 NOTE — Telephone Encounter (Signed)
Called and left patient a message to discuss symptoms and blood pressure further. Routing message to Dr. Lucretia RoersWood for any medical advice.    Bennie PieriniAlexandra Naumoff RN  [facility]

## 2015-09-27 NOTE — Telephone Encounter (Signed)
Left message for patient that he should hear from us on Monday about lab results and any medication changes. Clinic phone number provided to call back with questions.

## 2015-09-28 ENCOUNTER — Encounter (INDEPENDENT_AMBULATORY_CARE_PROVIDER_SITE_OTHER): Payer: Self-pay | Admitting: Family

## 2015-09-28 ENCOUNTER — Other Ambulatory Visit (INDEPENDENT_AMBULATORY_CARE_PROVIDER_SITE_OTHER): Payer: Self-pay

## 2015-09-28 LAB — ALDOSTERONE RENIN RATIO GROUP
Aldosterone (Sendout): 7.5 ng/dL
Aldosterone/Renin Ratio: 4
Renin Activity (Sendout): 1.7 ng/mL/h

## 2015-09-28 NOTE — Progress Notes (Signed)
Attempt#1. Left message to call back

## 2015-09-29 ENCOUNTER — Encounter (INDEPENDENT_AMBULATORY_CARE_PROVIDER_SITE_OTHER): Payer: Self-pay

## 2015-09-29 LAB — METANEPHRINES (FRACT.), URN

## 2015-09-29 NOTE — Telephone Encounter (Signed)
Please see Ecare message and advise.

## 2015-10-02 ENCOUNTER — Other Ambulatory Visit (HOSPITAL_BASED_OUTPATIENT_CLINIC_OR_DEPARTMENT_OTHER): Payer: BLUE CROSS/BLUE SHIELD

## 2015-10-02 LAB — REFERENCE LABORATORY TEST 1

## 2015-10-04 ENCOUNTER — Encounter (INDEPENDENT_AMBULATORY_CARE_PROVIDER_SITE_OTHER): Payer: Self-pay | Admitting: Family

## 2015-10-04 ENCOUNTER — Other Ambulatory Visit (HOSPITAL_COMMUNITY): Payer: Self-pay

## 2015-10-04 ENCOUNTER — Ambulatory Visit: Payer: BLUE CROSS/BLUE SHIELD | Attending: Cardiovascular Disease | Admitting: Cardiovascular Disease

## 2015-10-04 DIAGNOSIS — R002 Palpitations: Secondary | ICD-10-CM | POA: Insufficient documentation

## 2015-10-04 DIAGNOSIS — I10 Essential (primary) hypertension: Secondary | ICD-10-CM | POA: Insufficient documentation

## 2015-10-05 ENCOUNTER — Encounter (HOSPITAL_BASED_OUTPATIENT_CLINIC_OR_DEPARTMENT_OTHER): Payer: BLUE CROSS/BLUE SHIELD

## 2015-10-05 NOTE — Telephone Encounter (Signed)
Please see Pt eCare message, Pt is giving updates on his Sx and recent OV's with Specialty.     There is also information regarding the need for FMLA and dates.     Please review and advise. Thank you.

## 2015-10-05 NOTE — Telephone Encounter (Signed)
Defer to Fonnie MuKerry Meyer re FMLA issue  Med list updated

## 2015-10-05 NOTE — Telephone Encounter (Signed)
Appointment completed. Closing TE.

## 2015-10-06 NOTE — Progress Notes (Signed)
Transthoracic Echo Report  Patient Name: Anthony Andrade, Anthony Andrade  MRN: [id number]  Study Date: 10/04/2015 09:27 AM  Patient Class: Outpatient    DOB: [birth year]  Age: 50 yrs  Height: 70 in  Weight: 250 lb     BP: 132/70 mmHg  HR: 77  BSA: 2.3 m2   Service: [facility]    Gender: Male  Pt. Origin: ESC  Portable: No  Equipment: [place] 7       History: TGA and left heart failure,49 y.o with h/o palpitations, HTN, positional dizziness, sleep apnea.     Reason For Study: Evaluate cardiac and valvular function     Study Quality: Fair    CPT Codes:  93306/04830022: Transthoracic Echo with Spectral and Color Doppler.             CONCLUSIONS:  Normal left ventricular size and systolic function. LVEF 62%    RV size and systolic function are normal.     All valves are structurally normal with the exception of mild thickening of mitral and aortic valves. There is no valvular dysfunction.    Estimated right atrial pressure is normal. Unable to estimate pulmonary pressure due to inadequate TR jet.         PROCEDURE NOTES:   A complete two-dimensional transthoracic echocardiogram was performed (2D, M-mode, Doppler and color flow Doppler). The patient was comfortable and cooperative throughout the procedure. The underlying rhythm was sinus.     FINDINGS:   Left Ventricle:  The left ventricle is normal in size. There is normal left ventricular wall thickness. Global left ventricular function is normal. The calculated ejection fraction, as determined by the biplane method of disks, is 62%. No regional wall motion abnormalities are present. The overall diastolic pattern is one of impairment of left ventricular relaxation.   Right Ventricle:  The right ventricle is normal size. The basal right ventricular diameter measured from a right ventricular focused view is 3.6 cm. The right ventricular systolic function is normal. The tricuspid annular plane systolic excursion (TAPSE) measurement is 2.7 cm.   Aortic Valve:  The aortic valve  is trileaflet. The aortic valve is mildly thickened. No aortic regurgitation is present.   Mitral Valve:  The mitral valve leaflets are mildly thickened. There is trace mitral regurgitation.   Tricuspid Valve:  The tricuspid valve is structurally normal. Trace tricuspid regurgitation present.   Pulmonic Valve:  The pulmonic valve cusps are thin and pliable; valve motion is normal. Trace pulmonic valvular regurgitation is present.   Left Atrium:  The left atrial size is normal. The left atrial volume indexed to body surface area is at 27 ml/m2. This refers to the maximal volume measured prior to mitral valve opening.   Right Atrium:  Right atrial size is normal. The inferior vena cava appears normal. The inferior vena cava is normal in diameter (<2.1cm) and collapses >50% with sniff (estimated right atrial pressure 0-79mmHg).   Atrial Septum:  Lipomatous hypertrophy of the interatrial septum is present.   Aorta:  The diameter of the aorta at the level of the sinuses of Valsalva measures 3.5 cm in diameter. The diameter of the ascending aorta is 3.0 cm. The diameter of the aortic arch is 2.9 cm.   Pulmonary Artery:  Pulmonary artery systolic pressure could not be estimated secondary to a faint tricuspid regurgitant jet.   Pericardium/Pleural Space:  There is no pericardial effusion.     ECHO DIMENSIONS:     IVSd: 0.86 cm     (  F: 0.6-0.9 / M: 0.6-1.0)                                Ao root diam: 3.5 cm     (F: 2.1-2.5 / M: 2.3-2.9)       LVIDd: 4.3 cm     (F: 3.8-5.2 / M: 4.2-5.8)                   LVIDs: 2.7 cm     (F: 2.2-3.5 / M: 2.5-4.0)                   LVPWd: 0.79 cm     (F: 0.6-0.9 / M: 0.6-1.0)     RVDd: 3.6 cm          ACS: 2.3 cm                         asc Aorta Diam: 3.0 cm            FS: 36.6 %     (F: 27-45 / M: 25-43)           TAPSE: 2.7 cm     (>1.7)           DOPPLER FINDINGS:     LVOT max: 136.2 cm/sec          AoV max: 157.8 cm/sec                              RAP systole: 5.0 mmHg                         PV max: 87.4 cm/sec          PV max PG: 3.1 mmHg                               DIASTOLIC FUNCTION:     MV E max vel: 85.1 cm/sec                Pulm A Revs Vel: 17.1 cm/sec          Lat Peak E' Vel: 11.7 cm/sec            MV A max vel: 98.2 cm/sec                Pulm A Revs Dur: 0.07 sec          Med Peak E' Vel: 10.0 cm/sec            MV E/A: 0.87                 LV IVRT: 0.07 sec          E/e' med: 8.5             MV A dur: 0.10 sec                      E/e' lat: 7.3             MV dec time: 0.15 sec                      E/e': 7.9  MMode/2D Measurements & Calculations   EDV(MOD-sp4): 87.2 ml   EDV(sp4-el): 89.7 ml   ESV(MOD-sp4): 31.9 ml   ESV(sp4-el): 32.3 ml   EF(MOD-sp4): 63.4 %   EF(sp4-el): 64.0 %     EDV(MOD-sp2): 101.2 ml   EDV(sp2-el): 102.0 ml   ESV(MOD-sp2): 38.1 ml   ESV(sp2-el): 38.1 ml   EF(MOD-sp2): 62.4 %   EF(sp2-el): 62.7 %      EDV(MOD-bp): 93.9 ml   ESV(MOD-bp): 35.3 ml   EF(MOD-bp): 62.4 %     SV(MOD-sp4): 55.3 ml        --------------------------------------------------------------------------------      SV(sp4-el): 57.4 ml     LAV(MOD-bp): 62.4 ml   LAV(MOD-bp) index: 27.2 ml/m2     LAV(MOD-sp2): 55.0 ml     LAV(MOD-sp4): 68.8 ml          Doppler Measurements & Calculations   AoV max PG (full): 2.5 mmHg     LVOT max PG: 7.4 mmHg          Attending Physician Signing Statement:  I have personally reviewed these images. I agree with findings and conclusions as documented.   Interpreting Attending Physician:         Electronically Authenticated by Rose Fillers [phone]  on 10/06/2015 01:41 PM         Referring Attending Physician: Bing Matter     Cardiac Sonographer: Trellis Moment Kennon Portela       --------------------------------------------------------------------------------  Patient Name: Anthony Andrade, Anthony Andrade   MRN: [id number]  [facility]  [facility]  [place] [facility]  [place], [place]

## 2015-10-09 ENCOUNTER — Encounter (INDEPENDENT_AMBULATORY_CARE_PROVIDER_SITE_OTHER): Payer: Self-pay | Admitting: Family

## 2015-10-09 ENCOUNTER — Telehealth (INDEPENDENT_AMBULATORY_CARE_PROVIDER_SITE_OTHER): Payer: Self-pay | Admitting: Family

## 2015-10-09 DIAGNOSIS — H8113 Benign paroxysmal vertigo, bilateral: Secondary | ICD-10-CM | POA: Insufficient documentation

## 2015-10-09 NOTE — Telephone Encounter (Signed)
Paperwork faxed [phone]

## 2015-10-09 NOTE — Telephone Encounter (Signed)
Called [employer] leave service. Anthony Andrade  Will complete FMLA paper work for the additional days as patient has diagnosis to support these days which had not otherwise been covered.    BPV supported by ENT and neurology specialist with possible explanation on CT head to support this finding.

## 2015-10-09 NOTE — Telephone Encounter (Signed)
Anthony Andrade from the [facility] [facility] [employer] [facility] calling because the patient stated that Anthony Andrade wanted to speak with her. She states to call [phone] to speak with her or one of the representatives there. She states that when calling that number it may ask for a SSN and to just say "representative from a physician's office" to bypass the SSN request. The case number to reference is [id number]. She also would like to let Anthony Andrade know that the FMLA paperwork needs to be in today, 4/17, so it would need to be faxed today. Please call her back and also [facility], thanks.

## 2015-10-09 NOTE — Telephone Encounter (Signed)
Anthony Andrade, see ecare message above.

## 2015-10-09 NOTE — Telephone Encounter (Signed)
They are faxing me the form. Please let me know when I receive it. FMLA [employer]

## 2015-10-09 NOTE — Telephone Encounter (Signed)
CONFIRMED PHONE NUMBER: [phone]    CALLERS FIRST AND LAST NAME: Eda KeysMartin Bright    CALLERS RELATIONSHIP:Self  RETURN CALL: Detailed message on voicemail only     [facility] ONLY) Texting is an option for this clinic. If you would like us to use this option, which mobile phone number should we text to if we are unable to reach you?    SUBJECT: Form/Letter/Paperwork Request     REASON FOR REQUEST: FMLA     FORM/LETTER/PAPERWORK IS FOR: FMLA  WHO SHOULD FILL IT OUT?: Fonnie MuKerry Meyer  WILL PATIENT PICK UP?: Yes  No  NEEDED BY: ASAP  ADDITIONAL INFORMATION: patient dropped off FMLA patient and sent ecare message. Forwarding to [employer].R. DonnelleyKerry Meyer.

## 2015-10-12 NOTE — Telephone Encounter (Signed)
Paperwork complete. Please see 10/09/15 TE.

## 2015-10-13 ENCOUNTER — Other Ambulatory Visit (INDEPENDENT_AMBULATORY_CARE_PROVIDER_SITE_OTHER): Payer: Self-pay

## 2015-10-13 NOTE — Progress Notes (Signed)
Attempt #2.  Left message to call back.

## 2015-10-18 ENCOUNTER — Other Ambulatory Visit (INDEPENDENT_AMBULATORY_CARE_PROVIDER_SITE_OTHER): Payer: Self-pay | Admitting: Family

## 2015-10-18 DIAGNOSIS — K2100 Gastro-esophageal reflux disease with esophagitis, without bleeding: Secondary | ICD-10-CM

## 2015-10-19 MED ORDER — ESOMEPRAZOLE MAGNESIUM 40 MG OR CPDR
DELAYED_RELEASE_CAPSULE | ORAL | Status: DC
Start: 2015-10-19 — End: 2016-05-06

## 2015-10-20 ENCOUNTER — Other Ambulatory Visit (INDEPENDENT_AMBULATORY_CARE_PROVIDER_SITE_OTHER): Payer: BLUE CROSS/BLUE SHIELD

## 2015-10-20 NOTE — Progress Notes (Signed)
Attempt #3. Left message to call back

## 2015-10-23 ENCOUNTER — Encounter (INDEPENDENT_AMBULATORY_CARE_PROVIDER_SITE_OTHER): Payer: BLUE CROSS/BLUE SHIELD | Admitting: Cardiovascular Disease

## 2015-10-27 ENCOUNTER — Other Ambulatory Visit (INDEPENDENT_AMBULATORY_CARE_PROVIDER_SITE_OTHER): Payer: BLUE CROSS/BLUE SHIELD

## 2015-10-27 NOTE — Progress Notes (Signed)
eCare message sent

## 2015-11-06 ENCOUNTER — Encounter (INDEPENDENT_AMBULATORY_CARE_PROVIDER_SITE_OTHER): Payer: BLUE CROSS/BLUE SHIELD | Admitting: Cardiovascular Disease

## 2015-11-08 ENCOUNTER — Encounter (INDEPENDENT_AMBULATORY_CARE_PROVIDER_SITE_OTHER): Payer: Self-pay | Admitting: Family

## 2015-11-13 ENCOUNTER — Encounter (INDEPENDENT_AMBULATORY_CARE_PROVIDER_SITE_OTHER): Payer: Self-pay

## 2015-11-17 ENCOUNTER — Encounter (INDEPENDENT_AMBULATORY_CARE_PROVIDER_SITE_OTHER): Payer: Self-pay

## 2015-11-17 NOTE — Progress Notes (Signed)
Care Management Team Meeting    Issue(s):    1. Lost to contact letter mailed to patient 10/27/15      Action(s):    1. PCP to contact patient       Responsible Role: PCP

## 2015-11-21 ENCOUNTER — Telehealth (INDEPENDENT_AMBULATORY_CARE_PROVIDER_SITE_OTHER): Payer: Self-pay

## 2015-11-21 NOTE — Telephone Encounter (Signed)
Per our conversation, please contact patient.

## 2015-11-22 NOTE — Telephone Encounter (Signed)
First attempt to call patient.  Mail box is full    Will send eCare message.

## 2015-11-23 ENCOUNTER — Other Ambulatory Visit (INDEPENDENT_AMBULATORY_CARE_PROVIDER_SITE_OTHER): Payer: Self-pay | Admitting: Family

## 2015-11-23 DIAGNOSIS — E785 Hyperlipidemia, unspecified: Secondary | ICD-10-CM

## 2015-11-24 MED ORDER — ATORVASTATIN CALCIUM 20 MG OR TABS
ORAL_TABLET | ORAL | 1 refills | Status: DC
Start: 2015-11-24 — End: 2016-06-15

## 2015-11-28 NOTE — Telephone Encounter (Signed)
Tried to reach him again by telephone. No success.  I will send a letter.  Closing TE

## 2015-12-01 ENCOUNTER — Telehealth (HOSPITAL_BASED_OUTPATIENT_CLINIC_OR_DEPARTMENT_OTHER): Payer: Self-pay | Admitting: Neurology

## 2015-12-01 NOTE — Telephone Encounter (Signed)
(  TEXTING IS AN OPTION FOR [facility] CLINICS ONLY)  Is this a [facility] clinic? No      RETURN CALL: Detailed message on voicemail only      SUBJECT:  Appointment Request     REASON FOR REQUEST/SYMPTOMS: dizziness and giddiness  REFERRING PROVIDER: [place], Dr. Threasa AlphaZivin  REQUEST APPOINTMENT WITH: Dr. Marga Hootsakley  REQUESTED DATE: NA, TIME: NA  UNABLE TO APPOINT BECAUSE: [facility] unable to schedule for autonomic. Attempted to transfer to clinic but clinic was busy. Please call to schedule, thanks.

## 2015-12-04 NOTE — Telephone Encounter (Signed)
Called pt and left vm msg to call clinic to schedule appt - FTaylor 06*12/17 10:26

## 2015-12-05 ENCOUNTER — Encounter (INDEPENDENT_AMBULATORY_CARE_PROVIDER_SITE_OTHER): Payer: Self-pay | Admitting: Family

## 2015-12-05 DIAGNOSIS — R42 Dizziness and giddiness: Secondary | ICD-10-CM

## 2015-12-05 DIAGNOSIS — E039 Hypothyroidism, unspecified: Secondary | ICD-10-CM

## 2015-12-05 DIAGNOSIS — E669 Obesity, unspecified: Secondary | ICD-10-CM

## 2015-12-05 DIAGNOSIS — E1169 Type 2 diabetes mellitus with other specified complication: Secondary | ICD-10-CM

## 2015-12-05 DIAGNOSIS — R748 Abnormal levels of other serum enzymes: Secondary | ICD-10-CM

## 2015-12-05 DIAGNOSIS — Z1159 Encounter for screening for other viral diseases: Secondary | ICD-10-CM

## 2015-12-05 NOTE — Telephone Encounter (Signed)
FYI to Fonnie MuKerry Meyer and [place]ce.

## 2015-12-08 ENCOUNTER — Other Ambulatory Visit (INDEPENDENT_AMBULATORY_CARE_PROVIDER_SITE_OTHER): Payer: Self-pay

## 2015-12-08 NOTE — Progress Notes (Signed)
Responded to patient's eCare message.

## 2015-12-11 NOTE — Telephone Encounter (Signed)
I think he may be experiencing an interaction or drug side effect.     I will see if I can ask our Pharm D team to review this.  Any chance you could find out the best way to reach them?     I am out of the office today.    Fonnie MuKerry Becci Batty, Blanchard KelchARNP, PhD

## 2015-12-12 ENCOUNTER — Ambulatory Visit: Payer: BLUE CROSS/BLUE SHIELD | Attending: Neurology

## 2015-12-12 DIAGNOSIS — H8110 Benign paroxysmal vertigo, unspecified ear: Secondary | ICD-10-CM

## 2015-12-12 DIAGNOSIS — R42 Dizziness and giddiness: Secondary | ICD-10-CM

## 2015-12-21 ENCOUNTER — Other Ambulatory Visit (INDEPENDENT_AMBULATORY_CARE_PROVIDER_SITE_OTHER): Payer: Self-pay

## 2015-12-21 NOTE — Progress Notes (Signed)
Attempt#1. Left message to call back

## 2015-12-21 NOTE — Telephone Encounter (Signed)
Patient responded via ecare.

## 2015-12-25 ENCOUNTER — Other Ambulatory Visit (INDEPENDENT_AMBULATORY_CARE_PROVIDER_SITE_OTHER): Payer: Self-pay

## 2015-12-25 ENCOUNTER — Encounter (INDEPENDENT_AMBULATORY_CARE_PROVIDER_SITE_OTHER): Payer: Self-pay | Admitting: Family

## 2015-12-25 NOTE — Telephone Encounter (Signed)
Anthony Andrade, Anthony Andrade.

## 2015-12-27 ENCOUNTER — Other Ambulatory Visit (INDEPENDENT_AMBULATORY_CARE_PROVIDER_SITE_OTHER): Payer: Self-pay | Admitting: Family

## 2015-12-27 DIAGNOSIS — Z1159 Encounter for screening for other viral diseases: Secondary | ICD-10-CM

## 2015-12-29 NOTE — Progress Notes (Signed)
Referring provider: Nelva Bush, ARNP    CC: We were kindly asked to evaluate Anthony Andrade regarding allergies and recurrent benign positional vertigo (BPV).    HPI: Patient is a very pleasant 50 year old male presenting for initial evaluation of seasonal/perennial allergic rhinitis.    He states that for many years he has had symptoms of congestion, rhinorrhea, itchy/watery eyes and occasional postnasal drip.  Symptoms often worsen significantly during the spring/summer months as well as the fall months.  Symptoms usually worsen when the weather changes.  He was skin tested as a child and was positive to many different seasonal/perennial aeroallergens.  In addition to his nasal symptoms he also develops what he describes as asthma symptoms.  He develops shortness of breath, chest tightness with occasional wheezing.  He uses albuterol and Combivent for these issues.  This does improve his symptoms.  Does not wake up at night with wheezing or shortness of breath.    To treat his nasal symptoms he has been on Allegra-D, Flonase and Singulair.  These do improve his symptoms.  He was on Pataday eye drops in the past and this did improve his symptoms.  More recently he has been on ketotifen eyedrops and this has not worked quite as well.  Was prescribed Patanase in the past, however, does not remember taking this medication.    For his respiratory symptoms he is currently on Singulair, Combivent and albuterol as needed.  Was previously on budesonide/formoterol but stopped this medication.  He believed that this was effective for him.    Patient was previously followed by Dr. Cristie Hem in this clinic for different issues including recurrent sinus disease, chronic cough and vertigo.  His immune function was evaluated several years ago which was entirely unremarkable.  He also had a chest CT that was normal.  It was thought that his cough is due to an upper airway etiology.  He was started on Spiriva, [employer], Combivent,  Flonase.  He denies frequent episodes of sinusitis recently.    He has been seen by Dr. Estill Cotta in ENT for vertigo.  Has also been seen by Dr. 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  It was thought that his symptoms were possibly due to POTS.    He is been seen by cardiology for some episodes of dizziness and was thought that his symptoms were most consistent with vertigo.  An echocardiogram was ordered which was normal.  Also had a normal EKG.   Orthostatic testing did not reveal tachycardia or hypotension.     He is currently being followed by Dr. Leeroy Cha in neurology at [facility] and is undergoing physical therapy.  His vertigo symptoms have improved.    Outpatient Medications Prior to Visit   Medication Sig Dispense Refill   . AmLODIPine Besylate 10 MG Oral Tab One tablet every am 30 tablet 11   . Atorvastatin Calcium 20 MG Oral Tab take 1 tablet by mouth once daily to LOWER CHOLESTEROL 90 tablet 1   . Benzonatate 200 MG Oral Cap Take 1 capsule (200 mg) by mouth 3 times a day as needed for cough. 30 capsule 0   . Blood Glucose Monitoring Suppl Kit Use to check blood sugar once daily 1 kit 0   . Budesonide 0.5 MG/2ML Inhalation Suspension Inhale 2 mL (0.5 mg) via nebulizer every 12 hours. 120 mL 1   .  Certolizumab Pegol (CIMZIA PREFILLED) 2 X 200 MG/ML Subcutaneous Kit Inject 200 mg under the skin every 2 weeks.     . ClonazePAM 0.5 MG Oral Tab Take 1/2 to 1 tablet twice daily as needed 10 tablet 0   . Clotrimazole-Betamethasone 1-0.05 % External Cream Apply 1 application topically 2 times a day as needed (rash). Apply to buttocks 45 g 2   . DULoxetine HCl 60 MG Oral CAPSULE ENTERIC COATED PARTICLES Take 1 capsule (60 mg) by mouth daily. 90 capsule 3   . Esomeprazole Magnesium 40 MG Oral CAPSULE DELAYED RELEASE take 1 capsule by mouth twice a day ON AN EMPTY STOMACH 180 capsule 1   .  Fluticasone Propionate 50 MCG/ACT Nasal Suspension Spray 1 spray into each nostril 2 times a day. 1 bottle 3   . Glucose Blood (BLOOD GLUCOSE TEST) In Vitro Strip Use 1 strip daily. Use to check blood sugar. 100 strip 1   . Ipratropium-Albuterol 20-100 MCG/ACT Inhalation Aero Soln Inhale 2 puffs by mouth every 4 hours as needed (cough). 1 Inhaler 11   . Lancets Thin Misc Use 1 each daily. 100 each 1   . Levothyroxine Sodium 88 MCG Oral Tab Take 1 tablet (88 mcg) by mouth daily on an empty stomach. 90 tablet 2   . Losartan Potassium 50 MG Oral Tab One tablet twice daily 60 tablet 6   . Meclizine HCl 25 MG Oral Tab Take 2 tablets (50 mg) by mouth 2 times a day as needed for dizziness or nausea/vomiting. 60 tablet 0   . Meloxicam 15 MG Oral Tab Take 1 tablet (15 mg) by mouth daily. 90 tablet 2   . MetFORMIN HCl ER 750 MG Oral TABLET SR 24 HR Take 1 tablet (750 mg) by mouth daily. 30 tablet 3   . Montelukast Sodium 10 MG Oral Tab take 1 tablet by mouth every evening 90 tablet 3   . Olopatadine HCl 0.2 % Ophthalmic Solution Place 1 drop in each EYE daily. 2.5 mL 2   . Olopatadine HCl 0.6 % Nasal Solution Spray 2 sprays into each nostril 2 times a day. 1 bottle 11   . Ondansetron 8 MG Oral TABLET DISPERSIBLE Dissolve 1 tablet (8 mg) on top of tongue and swallow every 8 hours as needed for nausea/vomiting. 60 tablet 1   . Pregabalin 225 MG Oral Cap One tablet at dinner and one tablet at bedtime 60 capsule 3   . Respiratory Therapy Supplies (NEBULIZER) Does not apply Device Use every 6 hours as needed for other (chronic bronchitis). Chronic bronchitis 1 Device 0   . ROPINIRole HCl 0.25 MG Oral Tab By mouth, 1 tab (0.25 mg) once daily 1 to 3 hours before bedtime & in 2 days increase to  0.5 mg daily (2 tabs), and after 7 days to 1 mg daily. Dose may be further titrated upward in 0.5 mg increments every week until reaching a daily dose of 3 mg during week 6. Daily dose may be increased to a maximum of 4 mg beginning week 7.  120 tablet 2   . Spacer/Aero-Holding Chambers (AEROCHAMBER Z-STAT PLUS) Misc Use with inhaler as directed. 1 Device 1   . Spironolactone 25 MG Oral Tab Take 1 tablet (25 mg) by mouth daily. 1 tablet 0     No facility-administered medications prior to visit.        Past Medical History:   Diagnosis Date   . Allergic rhinitis due to other allergen    .   Allergic rhinitis, cause unspecified 01/19/2009   . Ankylosing spondylitis (HCC)    . Asthma 07/11/2013   . Chronic pain 02/16/2012   . Depressive disorder, not elsewhere classified 12/16/2008   . Esophageal reflux    . Hypertension 02/16/2012   . Hypertriglyceridemia 04/06/2015   . Hypothyroidism 09/11/2012   . Restless legs syndrome (RLS)    . Sleep apnea 08/20/2010   . Unspecified sleep apnea        Social History     Social History   . Marital status: Married     Spouse name: N/A   . Number of children: N/A   . Years of education: N/A     Occupational History   . Not on file.     Social History Main Topics   . Smoking status: Former Smoker   . Smokeless tobacco: Never Used   . Alcohol use No   . Drug use: No   . Sexual activity: Not on file     Other Topics Concern   . Not on file     Social History Narrative    Recently moved from [place].  Works for [employer].  One daughter.  Sister is an RN.         02/15/12    Lives with his wife.    Daughter is studious     Son has been diagnosed with mild asperger's syndrome.    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  [place]  Has lived in many different areas of the country.  Has 3 dogs, no cats.  Has a 4-5-pack-year history of smoking, quit 12 years ago.   Family History   Problem Relation Age of Onset   . Heart Attack Maternal Grandfather    . Diabetes Mother    . Hypertension Mother    . Hypertension Father    . Pancreatic Cancer Paternal Grandfather      Multiple family members with allergic rhinitis and asthma.  Review of patient's allergies  indicates:  Allergies   Allergen Reactions   . Morphine Nausea/Vomiting   . Penicillins Rash     Review of Systems   Constitutional: Negative for fever.   HENT: Reviewed per HPI.    Eyes: Negative for pain, discharge, redness and itching.   Respiratory: Reviewed per HPI.    Cardiovascular: Negative for chest pain.   Gastrointestinal: Negative for nausea and abdominal pain.   Endocrine: Negative for cold intolerance.   Musculoskeletal: Negative for joint swelling.   Skin: Negative for color change and rash.   Neurological: Reviewed per HPI.    Hematological: Negative for adenopathy.    Physical Exam:  BP 124/84   Pulse 84   Ht 5' 11" (1.803 m)   Wt (!) 251 lb (113.9 kg)   SpO2 98%   BMI 35.01 kg/m   Gen: Alert and Oriented NAD,   HEENT: PERRL, nasal mucosa non-erythematous, non-boggy   CV: RRR, normal S1 and S2  Resp: CTA, no wheezes or crackles  Abd: Soft and NT  Ext: No LE edema  Skin: No rashes on upper ext or face    11/15/2014 11:21 AM - [facility] Results Interface, User     Narrative     EXAMINATION:   CT HEAD WO CONT    CLINICAL INDICATION:  3 days of increasing dizziness, N/V, unable to walk without assistance,     altered mental status.   Seen in ER for labyrinthitis hydrated and released.   Rule out intercranial bleed.    TECHNIQUE:  CT Head Routine wo contrast (N-1)  Contiguous axial sections were obtained through the head without contrast.  Patient age specific scan parameters were used for radiation exposure.    COMPARISON:  None.    FINDINGS:  MASS EFFECT & VENTRICLES: No shift. The lateral ventricles are symmetric. The   ventricles, sulci and cisterns are normal.  BRAIN: The brain parenchyma is normal. No acute infarct, hemorrhage or mass   lesion is seen.   VASCULAR: Cavernous carotids and vertebral vessels are normal.  EXTRA-AXIAL: Extra-axial spaces are normal.   EXTRA-CRANIAL:  Skull and facial bones are normal. Sinuses and mastoids are   clear. Orbits are normal.      MRI   1. No  evidence of recent infarct, acute intracranial hemorrhage,  cerebral edema, hydrocephalus, or pathologic mass lesion.No evidence  of vestibular schwannoma.  2. 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  3. Thinning of the superior walls of the superior semicircular canals  bilaterally which can be a normal anatomic variant.However, if  dehiscence is suspected clinically, thin section temporal bone CT could  be performed for further evaluation.    Skin prick testing: [facility] Panel    Skin testing was performed to evaluate for IgE mediated hypersensitivity    Interpretation of Results:  0: No reaction  +: Erythema ? 5 mm  ++: Erythema 5 mm ? mm with wheal < 3 mm  +++: Wheal 3-6 mm  ++++: Wheal > 6 mm or pseudopod     Saline Control:   0  Histamine:   ++    Alternaria:   0  Aspergillus:   0  Helminthosporium:  0  Hormodendrum:  0  Penicillium:   0  Stemphylium:   0  Grass Mix:   ++    Dust mite (DF):  +  Dust mite (DP):  0  Cockroach mix:  0  Cat Hair:   0  Dog Ep:   0  Alder:    0  Ash:    0  Birch:    0    Cottonwood:   0  Elm:    0  Hazelnut:   0  Juniper:   +  Maple:    0  Oak:    0  Walnut:   0  Willow:    0    Dock/Sorrel:   0  English Plantain:  0  Lamb's Quarters:  0  Nettle:    0  Ragweed:   0  Sagebrush:   0    Interpretation:  Positive to grass mix, dust mite (DF) and juniper pollen.      IMPRESSION/RECOMMENDATIONS    1.  Asthma-mild/moderate persistent-well-controlled currently.  His history of asthma that dates back many years.  Describes symptoms of chest tightness, shortness of breath and occasional wheezing.  Triggers include the spring/summer months well as the fall months.  He is currently using albuterol and Combivent.  He has also been on Singular 10 mg daily.  He uses the Combivent mainly for cough, which has been well-controlled.  For persistent symptoms, discussed that he should be on an  inhaled corticosteroid.  He has Andrade well with Symbicort in the past.  During the spring/summer months as well as the fall months, would recommend starting Symbicort 80/4.52   puffs inhaled twice a day.  Had long discussion with him regarding the fact that this decreases inflammation associated with asthma.  Has been on Singular 10 mg daily, which continue this regimen.  Follow-up in 2-3 months.  He was given a calendar regarding when his specific aeroallergens are in bloom.  Recommendations:   -- For persistent symptoms, start Symbicort 80/4.52 puffs inhaled twice a day  -- Can continue albuterol MDI 2 puffs every 4 hours when necessary  -- Can continue Combivent 2 puffs every 4 hours when necessary, uses [place] for cough    2. Seasonal allergic rhinoconjunctivitis- grass mix, juniper pollen.  Was positive to seasonal aeroallergens.  To keep doors and windows closed during peak pollen season.  Was given a calendar detailing when his specific aeroallergens are in bloom.  Given his sensitivies, we always have the option of allergy shots but hopefully we can control symptoms with environmental controls and medications.  Has been on Flonase, Singulair and Allegra-D for this issue.  Discussed that he should trial stopping Allegra-D given the decongesting can cause hypertension.  We'll start Patanase 2 sprays each nostril twice a day.  He is also been on Pataday and this worked well for him.  We'll switch from ketotifen to Pataday 1 drop each eye daily as needed.  Recommendations:  -- Continue Flonase 2 sprays each nostril daily  -- Restart Pataday eyedrops 1 drop each eye daily as needed  -- Continue Singulair 10 mg daily  -- Start Patanase 2 sprays each nostril twice a day  -- Recommended switching from Allegra-D to generic fexofenadine 180 mg daily    3. Perennial allergic rhinoconjunctivitis- dust mites (DF).  Reccommended keeping animals out of the bedroom if possible.  Dust mite precautions were discussed today in  clinic, they include dust mite sheet covers and washing the bedding at >130 degrees.  Was given  handout regarding these measures.   Recommendations:  -- Start dust mite control measures    4.  Chronic cough-has had an extensive workup that has been largely unremarkable.  We'll treat asthma and allergic rhinitis as discussed above.  Forcefully, his cough is improved over the last year.    5.  Vertigo-has been followed by neurology for this issue and his symptoms have improved.    Follow-up in 2-3 months.    Thank you for the consultation on this very pleasant patient, please to not hesitate to contact me with questions- Pager: [phone], Email: [email or web address].    This note was constructed using voice recognition technology, please excuse any errors.

## 2016-01-01 ENCOUNTER — Ambulatory Visit: Payer: BLUE CROSS/BLUE SHIELD | Attending: Family | Admitting: Allergy/Immunology

## 2016-01-01 ENCOUNTER — Encounter (INDEPENDENT_AMBULATORY_CARE_PROVIDER_SITE_OTHER): Payer: Self-pay | Admitting: Allergy/Immunology

## 2016-01-01 ENCOUNTER — Other Ambulatory Visit (INDEPENDENT_AMBULATORY_CARE_PROVIDER_SITE_OTHER): Payer: Self-pay | Admitting: Family

## 2016-01-01 ENCOUNTER — Encounter (HOSPITAL_BASED_OUTPATIENT_CLINIC_OR_DEPARTMENT_OTHER): Payer: BLUE CROSS/BLUE SHIELD

## 2016-01-01 VITALS — BP 124/84 | HR 84 | Ht 71.0 in | Wt 251.0 lb

## 2016-01-01 DIAGNOSIS — R42 Dizziness and giddiness: Secondary | ICD-10-CM | POA: Insufficient documentation

## 2016-01-01 DIAGNOSIS — J3089 Other allergic rhinitis: Secondary | ICD-10-CM

## 2016-01-01 DIAGNOSIS — R748 Abnormal levels of other serum enzymes: Secondary | ICD-10-CM

## 2016-01-01 DIAGNOSIS — Z1159 Encounter for screening for other viral diseases: Secondary | ICD-10-CM

## 2016-01-01 DIAGNOSIS — R053 Chronic cough: Secondary | ICD-10-CM

## 2016-01-01 DIAGNOSIS — R05 Cough: Secondary | ICD-10-CM

## 2016-01-01 DIAGNOSIS — J302 Other seasonal allergic rhinitis: Secondary | ICD-10-CM

## 2016-01-01 DIAGNOSIS — K7689 Other specified diseases of liver: Secondary | ICD-10-CM | POA: Insufficient documentation

## 2016-01-01 DIAGNOSIS — E1169 Type 2 diabetes mellitus with other specified complication: Secondary | ICD-10-CM

## 2016-01-01 DIAGNOSIS — J4540 Moderate persistent asthma, uncomplicated: Secondary | ICD-10-CM

## 2016-01-01 DIAGNOSIS — E039 Hypothyroidism, unspecified: Secondary | ICD-10-CM

## 2016-01-01 DIAGNOSIS — H1013 Acute atopic conjunctivitis, bilateral: Secondary | ICD-10-CM

## 2016-01-01 DIAGNOSIS — E669 Obesity, unspecified: Secondary | ICD-10-CM

## 2016-01-01 DIAGNOSIS — Z6835 Body mass index (BMI) 35.0-35.9, adult: Secondary | ICD-10-CM

## 2016-01-01 LAB — TSH WITH REFLEXIVE FREE T4: TSH with Reflexive Free T4: 1.802 u[IU]/mL (ref 0.400–5.000)

## 2016-01-01 LAB — COMPREHENSIVE METABOLIC PANEL
ALT (GPT): 45 U/L (ref 10–48)
AST (GOT): 26 U/L (ref 9–38)
Albumin: 4.2 g/dL (ref 3.5–5.2)
Alkaline Phosphatase (Total): 89 U/L (ref 39–139)
Anion Gap: 9 (ref 4–12)
Bilirubin (Total): 0.9 mg/dL (ref 0.2–1.3)
Calcium: 9.6 mg/dL (ref 8.9–10.2)
Carbon Dioxide, Total: 28 meq/L (ref 22–32)
Chloride: 100 meq/L (ref 98–108)
Creatinine: 1.01 mg/dL (ref 0.51–1.18)
GFR, Calc, African American: >60 mL/min/{1.73_m2} (ref >59)
GFR, Calc, European American: >60 mL/min/{1.73_m2} (ref >59)
Glucose: 128 mg/dL — ABNORMAL HIGH (ref 62–125)
Potassium: 4 meq/L (ref 3.6–5.2)
Protein (Total): 6.8 g/dL (ref 6.0–8.2)
Sodium: 137 meq/L (ref 135–145)
Urea Nitrogen: 17 mg/dL (ref 8–21)

## 2016-01-01 LAB — HEPATITIS C AB WITH REFLEX PCR: Hepatitis C Antibody w/Rflx PCR: NONREACTIVE

## 2016-01-01 MED ORDER — OLOPATADINE HCL 0.6 % NA SOLN
2.0000 | Freq: Two times a day (BID) | NASAL | 11 refills | Status: DC
Start: 2016-01-01 — End: 2017-08-25

## 2016-01-01 MED ORDER — BUDESONIDE-FORMOTEROL FUMARATE 80-4.5 MCG/ACT IN AERO
2.0000 | INHALATION_SPRAY | Freq: Two times a day (BID) | RESPIRATORY_TRACT | 6 refills | Status: DC
Start: 2016-01-01 — End: 2017-08-28

## 2016-01-01 MED ORDER — OLOPATADINE HCL 0.2 % OP SOLN
1.0000 [drp] | Freq: Every day | OPHTHALMIC | 6 refills | Status: DC
Start: 2016-01-01 — End: 2017-08-25

## 2016-01-01 NOTE — Patient Instructions (Addendum)
Skin prick testing: [facility] Panel    Skin testing was performed to evaluate for IgE mediated hypersensitivity    Interpretation of Results:  0: No reaction  +: Erythema ? 5 mm  ++: Erythema 5 mm ? mm with wheal < 3 mm  +++: Wheal 3-6 mm  ++++: Wheal > 6 mm or pseudopod     Saline Control:   0  Histamine:   ++    Alternaria:   0  Aspergillus:   0  Helminthosporium:  0  Hormodendrum:  0  Penicillium:   0  Stemphylium:   0  Grass Mix:   ++    Dust mite (DF):  +  Dust mite (DP):  0  Cockroach mix:  0  Cat Hair:   0  Dog Ep:   0  Alder:    0  Ash:    0  Birch:    0    Cottonwood:   0  Elm:    0  Hazelnut:   0  Juniper:   +  Maple:    0  Oak:    0  Walnut:   0  Willow:    0    Dock/Sorrel:   0  English Plantain:  0  Lamb's Quarters:  0  Nettle:    0  Ragweed:   0  Sagebrush:   0    Interpretation:  Positive to grass mix, dust mite (DF) and juniper pollen.            For your asthma-    If you're needing the albuterol and Combivent frequently, would start Symbicort 2 puffs inhaled twice a day.  This decreases inflammation associated with asthma.    Continue Singulair 10 mg daily.    Start dust mite control measures          For your nasal symptoms-    Continue your current medical regimen.    Consider switching from Allegra-D to generic fexofenadine 180 mg daily.  The decongestant in Allegra-D can cause high blood pressure.    Start Patanase 2 sprays each nostril twice a day in addition to Flonase.    I prescribed Pataday eyedrops 1 drop each eye daily as needed.        Follow-up in 2-3 months.    Thank you for coming in today.  Feel free to contact me through eCare or call the office if questions arise.  If you are not signed up for eCare, please see instructions below.     Anthony Andrade     Anthony Millicent Blazejewski, MD    This note was constructed using voice recognition technology, please excuse any errors.

## 2016-01-02 LAB — HEMOGLOBIN A1C, HPLC: Hemoglobin A1C: 7 % — ABNORMAL HIGH (ref 4.0–6.0)

## 2016-01-02 NOTE — Telephone Encounter (Signed)
Your liver ultrasound is positive for fatty liver disease. Continue your efforts to reduce fat and empty calories from your daily intake.     Please let me know if you have any question.

## 2016-01-02 NOTE — Progress Notes (Signed)
Your lab results is normal. If this was a FASTING glucose then the glucose is still too high. Were you fasting?

## 2016-01-03 NOTE — Progress Notes (Signed)
The results of your A1c has improved.  Keep up the great work.      All other results are normal.    Fonnie MuKerry Demarian Epps, ARNP, PhD

## 2016-01-06 ENCOUNTER — Encounter (INDEPENDENT_AMBULATORY_CARE_PROVIDER_SITE_OTHER): Payer: Self-pay

## 2016-01-09 ENCOUNTER — Other Ambulatory Visit (INDEPENDENT_AMBULATORY_CARE_PROVIDER_SITE_OTHER): Payer: Self-pay

## 2016-01-09 NOTE — Progress Notes (Signed)
Attempt #2.  Left message to call back.

## 2016-01-17 ENCOUNTER — Other Ambulatory Visit (INDEPENDENT_AMBULATORY_CARE_PROVIDER_SITE_OTHER): Payer: Self-pay

## 2016-01-17 NOTE — Progress Notes (Signed)
Attempt #3. Left message to call back

## 2016-01-28 ENCOUNTER — Other Ambulatory Visit (INDEPENDENT_AMBULATORY_CARE_PROVIDER_SITE_OTHER): Payer: Self-pay | Admitting: Family

## 2016-01-28 DIAGNOSIS — E039 Hypothyroidism, unspecified: Secondary | ICD-10-CM

## 2016-01-29 MED ORDER — LEVOTHYROXINE SODIUM 88 MCG OR TABS
88.0000 ug | ORAL_TABLET | Freq: Every day | ORAL | 1 refills | Status: AC
Start: 2016-01-29 — End: ?

## 2016-01-29 NOTE — Telephone Encounter (Signed)
Last labs found 01/01/16 TSH wnl, but last visit 09/22/15.

## 2016-02-02 ENCOUNTER — Encounter (INDEPENDENT_AMBULATORY_CARE_PROVIDER_SITE_OTHER): Payer: Self-pay

## 2016-02-02 NOTE — Progress Notes (Signed)
Multiple unsuccessful attempts to contact patient.  Will convert to CM Monitoring.

## 2016-02-08 ENCOUNTER — Other Ambulatory Visit (INDEPENDENT_AMBULATORY_CARE_PROVIDER_SITE_OTHER): Payer: Self-pay | Admitting: Family

## 2016-02-08 DIAGNOSIS — IMO0001 Reserved for inherently not codable concepts without codable children: Secondary | ICD-10-CM

## 2016-02-08 NOTE — Telephone Encounter (Signed)
Requested: Metformin ER 750 mg tablet - 30 tablets Last refill: 01/12/2016    Per 12/05/15 Encounter:  Message from patient: "I am seeing an Endocrinologist, and a Nephrologist who are both helping me control my high blood pressure and diabetes."    - Did you want to authorize the metformin or defer to the endocrinologist?    Please sign the pending prescription(s) if you approve.  If this medication is denied please have your staff inform the patient and schedule an appointment if necessary.

## 2016-02-09 MED ORDER — METFORMIN HCL ER 750 MG OR TB24
EXTENDED_RELEASE_TABLET | ORAL | 3 refills | Status: DC
Start: 2016-02-09 — End: 2016-06-15

## 2016-03-04 ENCOUNTER — Other Ambulatory Visit (INDEPENDENT_AMBULATORY_CARE_PROVIDER_SITE_OTHER): Payer: Self-pay | Admitting: Family

## 2016-03-04 ENCOUNTER — Encounter (INDEPENDENT_AMBULATORY_CARE_PROVIDER_SITE_OTHER): Payer: BLUE CROSS/BLUE SHIELD | Admitting: Allergy/Immunology

## 2016-03-04 DIAGNOSIS — K2900 Acute gastritis without bleeding: Secondary | ICD-10-CM

## 2016-03-04 MED ORDER — ONDANSETRON 8 MG OR TBDP
ORAL_TABLET | ORAL | 0 refills | Status: DC
Start: 2016-03-04 — End: 2019-09-22

## 2016-03-04 NOTE — Telephone Encounter (Signed)
This medication is outside of the Refill Center's protocols. Please sign and close the encounter if you approve: Ondansetron ODT 8mg     If this medication is denied please have your staff inform the patient and schedule an appointment if necessary.

## 2016-03-05 ENCOUNTER — Telehealth (INDEPENDENT_AMBULATORY_CARE_PROVIDER_SITE_OTHER): Payer: Self-pay | Admitting: Family

## 2016-03-05 NOTE — Telephone Encounter (Signed)
Received PA request for Ondansetron 8 MG Oral TABLET DISPERSIBLE.     [facility]   Phone # [phone]   Fax # [phone]       Reason for PA: Plan limits exceeded       Insurance Name: [employer]   Phone # Not listed   Johnney KillianBin # Not listed   Member ID # [id number]   Key # [id number]    This looks to be a renewal of a previously approved PA.  Please submit for coverage.

## 2016-03-06 NOTE — Telephone Encounter (Signed)
PA submitted via Navinet.  Will check for response in 24-72  Hours    KEY [id number]

## 2016-03-06 NOTE — Telephone Encounter (Signed)
Approval Received  Insurance: [employer]    Medication: Ondansetron 8 MG Oral TABLET DISPERSIBLE   Quantity / Frequency: 60/20   Dates Approved: 02/05/2016 thru 03/06/2017   Prior Auth. Number: [id number]     Additional Notes: Pharmacy notivied     Excerpt of Approval    Approvedtoday  [id number]:[id number];QIONGEXCaseId:[id number];Product Name:QD (PLA): Antiemetic Agents (Anzemet, Emend, Kytril, Zofran, Zuplenz, Granisol, aprepitant, ondansetron, granisetron, Sancuso) *multi-client custom*;Status:Approved;Coverage Start Date:02/05/2016;Coverage End Date:03/06/2017;

## 2016-04-04 ENCOUNTER — Other Ambulatory Visit (INDEPENDENT_AMBULATORY_CARE_PROVIDER_SITE_OTHER): Payer: Self-pay | Admitting: Family

## 2016-04-04 DIAGNOSIS — G8929 Other chronic pain: Secondary | ICD-10-CM

## 2016-04-04 NOTE — Telephone Encounter (Signed)
The requested medication requires your authorization.     Duloxetine (dose clarification) - note on pt med list states pt taking 90mg  QDAY,  while pharmacy is requesting for 60mg  QDAY. Defer to PCP to confirm current dose pt is to be taking. Thank you.

## 2016-04-04 NOTE — Telephone Encounter (Signed)
Left message to call clinic back.

## 2016-04-04 NOTE — Telephone Encounter (Signed)
Please call patient to verify dose

## 2016-04-05 NOTE — Telephone Encounter (Signed)
Patient called back. The patient is currently taking 60mg .     Routing to provider

## 2016-04-05 NOTE — Telephone Encounter (Signed)
2nd attempt, left voice message requesting patient to return call when message received.    Will try patient again later today.    CCR:  If patient returns phone call, please transfer call to an [facility] medical assistant at extension [phone]. If no one is available to answer, please take a message and forward the TE to the [facility] Clinical Staff Pool.

## 2016-04-07 MED ORDER — DULOXETINE HCL 60 MG OR CPEP
DELAYED_RELEASE_CAPSULE | ORAL | 0 refills | Status: DC
Start: 2016-04-07 — End: 2017-08-25

## 2016-04-07 NOTE — Telephone Encounter (Signed)
#  [phone] sent  Please call to make an appointment

## 2016-04-24 ENCOUNTER — Other Ambulatory Visit: Payer: Self-pay

## 2016-05-04 ENCOUNTER — Inpatient Hospital Stay: Payer: Self-pay

## 2016-05-04 ENCOUNTER — Encounter (INDEPENDENT_AMBULATORY_CARE_PROVIDER_SITE_OTHER): Payer: Self-pay

## 2016-05-06 ENCOUNTER — Other Ambulatory Visit (INDEPENDENT_AMBULATORY_CARE_PROVIDER_SITE_OTHER): Payer: Self-pay | Admitting: Family

## 2016-05-06 DIAGNOSIS — I10 Essential (primary) hypertension: Secondary | ICD-10-CM

## 2016-05-06 DIAGNOSIS — K2100 Gastro-esophageal reflux disease with esophagitis, without bleeding: Secondary | ICD-10-CM

## 2016-05-07 MED ORDER — LOSARTAN POTASSIUM 50 MG OR TABS
ORAL_TABLET | ORAL | 5 refills | Status: AC
Start: 2016-05-07 — End: ?

## 2016-05-07 MED ORDER — ESOMEPRAZOLE MAGNESIUM 40 MG OR CPDR
DELAYED_RELEASE_CAPSULE | ORAL | 1 refills | Status: DC
Start: 2016-05-07 — End: 2016-11-27

## 2016-05-09 ENCOUNTER — Encounter (INDEPENDENT_AMBULATORY_CARE_PROVIDER_SITE_OTHER): Payer: Self-pay

## 2016-05-09 NOTE — Progress Notes (Signed)
Care Management Team Meeting    Issue(s):    1. "Monitoring patient" noted to have ED visit:  Migraine vs stroke    2. Neuro follow up only per discharge instructions    Action(s):    1. PCP notes patient is mainly being followed by specialists at this time-no PCP f/u needed       2. Will continue to monitor

## 2016-06-08 ENCOUNTER — Encounter (INDEPENDENT_AMBULATORY_CARE_PROVIDER_SITE_OTHER): Payer: Self-pay

## 2016-06-15 ENCOUNTER — Other Ambulatory Visit (INDEPENDENT_AMBULATORY_CARE_PROVIDER_SITE_OTHER): Payer: Self-pay | Admitting: Family

## 2016-06-15 DIAGNOSIS — E785 Hyperlipidemia, unspecified: Secondary | ICD-10-CM

## 2016-06-15 DIAGNOSIS — IMO0001 Reserved for inherently not codable concepts without codable children: Secondary | ICD-10-CM

## 2016-06-18 MED ORDER — ATORVASTATIN CALCIUM 20 MG OR TABS
20.0000 mg | ORAL_TABLET | Freq: Every day | ORAL | 0 refills | Status: DC
Start: 2016-06-18 — End: 2022-06-19

## 2016-06-18 MED ORDER — METFORMIN HCL ER 750 MG OR TB24
750.0000 mg | EXTENDED_RELEASE_TABLET | Freq: Every day | ORAL | 0 refills | Status: AC
Start: 2016-06-18 — End: ?

## 2016-06-18 NOTE — Telephone Encounter (Addendum)
Refill request for Metformin ER requires your authorization because last seen endocrinologist on 05/30/16 and mentioned increase dose to TID.  Defer to provider to assess request.    Metformin ER 750mg  tablet-1 tab daily- last filled 05/08/16     If you want to authorize it, please indicate # of refills, sign the order and close the encounter.    If this medication is denied please have your staff inform the patient and schedule an appointment if necessary.

## 2016-07-04 ENCOUNTER — Encounter (INDEPENDENT_AMBULATORY_CARE_PROVIDER_SITE_OTHER): Payer: Self-pay

## 2016-07-10 ENCOUNTER — Encounter (INDEPENDENT_AMBULATORY_CARE_PROVIDER_SITE_OTHER): Payer: Self-pay

## 2016-07-10 NOTE — Progress Notes (Signed)
3 Month care management monitoring review:  Discharging from care monitoring status.  Plan-Will remain available for future care management needs.

## 2016-07-24 ENCOUNTER — Telehealth (INDEPENDENT_AMBULATORY_CARE_PROVIDER_SITE_OTHER): Payer: Self-pay | Admitting: Family

## 2016-07-24 ENCOUNTER — Other Ambulatory Visit (INDEPENDENT_AMBULATORY_CARE_PROVIDER_SITE_OTHER): Payer: Self-pay | Admitting: Family

## 2016-07-24 DIAGNOSIS — J4532 Mild persistent asthma with status asthmaticus: Secondary | ICD-10-CM

## 2016-07-24 DIAGNOSIS — R059 Cough, unspecified: Secondary | ICD-10-CM

## 2016-07-24 MED ORDER — IPRATROPIUM-ALBUTEROL 20-100 MCG/ACT IN AERS
2.0000 | INHALATION_SPRAY | RESPIRATORY_TRACT | 11 refills | Status: DC | PRN
Start: 2016-07-24 — End: 2017-08-25

## 2016-07-24 NOTE — Telephone Encounter (Signed)
[facility]

## 2016-07-24 NOTE — Telephone Encounter (Signed)
[facility] [place] called to request a new script, or an addition to the sig route for script, to include no more than 4 puffs per day, as this is the max allowed.    Pt is on vacation in [place] and is attempting to pick up a refill.     [name], [name], ARNP     Medication Quantity Refills Start End   Ipratropium-Albuterol 20-100 MCG/ACT Inhalation Aero Soln 1 Inhaler 11 08/09/2015    Sig: Comment:   Inhale 2 puffs by mouth every 4 hours as needed (cough). (Combivent Respimat)     [id number] [id number] [place] [phone]     Last appt: 09/04/15    Next appt: No Appt Scheduled

## 2016-07-24 NOTE — Telephone Encounter (Signed)
This medication is outside of the Refill Center's protocols.     Please sign and close the encounter if you approve: Benzonatate 200mg    If this medication is denied please have your staff inform the patient and schedule an appointment if necessary.

## 2016-07-26 MED ORDER — BENZONATATE 200 MG OR CAPS
ORAL_CAPSULE | ORAL | 0 refills | Status: DC
Start: 2016-07-26 — End: 2017-08-25

## 2016-11-20 ENCOUNTER — Encounter (INDEPENDENT_AMBULATORY_CARE_PROVIDER_SITE_OTHER): Payer: BLUE CROSS/BLUE SHIELD | Admitting: Family

## 2016-11-27 ENCOUNTER — Encounter (INDEPENDENT_AMBULATORY_CARE_PROVIDER_SITE_OTHER): Payer: Self-pay | Admitting: Family

## 2016-11-27 ENCOUNTER — Ambulatory Visit (INDEPENDENT_AMBULATORY_CARE_PROVIDER_SITE_OTHER): Payer: BLUE CROSS/BLUE SHIELD | Admitting: Family

## 2016-11-27 VITALS — BP 118/80 | HR 90 | Temp 99.0°F | Resp 12 | Ht 69.5 in | Wt 234.6 lb

## 2016-11-27 DIAGNOSIS — J4540 Moderate persistent asthma, uncomplicated: Secondary | ICD-10-CM

## 2016-11-27 DIAGNOSIS — K2100 Gastro-esophageal reflux disease with esophagitis, without bleeding: Secondary | ICD-10-CM

## 2016-11-27 DIAGNOSIS — Z6834 Body mass index (BMI) 34.0-34.9, adult: Secondary | ICD-10-CM

## 2016-11-27 DIAGNOSIS — Z1211 Encounter for screening for malignant neoplasm of colon: Secondary | ICD-10-CM

## 2016-11-27 MED ORDER — ESOMEPRAZOLE MAGNESIUM 40 MG OR CPDR
40.0000 mg | DELAYED_RELEASE_CAPSULE | Freq: Every day | ORAL | 3 refills | Status: DC
Start: 2016-11-27 — End: 2016-12-26

## 2016-11-27 MED ORDER — ESOMEPRAZOLE MAGNESIUM 40 MG OR CPDR
40.0000 mg | DELAYED_RELEASE_CAPSULE | Freq: Two times a day (BID) | ORAL | 3 refills | Status: DC
Start: 2016-11-27 — End: 2016-12-26

## 2016-11-27 MED ORDER — ALBUTEROL SULFATE HFA 108 (90 BASE) MCG/ACT IN AERS
2.0000 | INHALATION_SPRAY | RESPIRATORY_TRACT | 2 refills | Status: DC | PRN
Start: 2016-11-27 — End: 2018-07-31

## 2016-11-27 NOTE — Progress Notes (Signed)
CHIEF Complaint(s)  Anthony Andrade is a 51 year old Male here with chief complaint(s): He is here for refill of Nexium because insurance would no longer cover it. This was in January. He tried many other combinations.    nexium 40 mg twice daily works for him.     He has tried - prilosec, omeprazole and many other and the appropriate dose and he still has   Burning, nausea, difficulty sleeping, bad taste in his mouth and he can taste his stomach. Six months trial    Nexium - he was takeing 40  Mg twice daily for six yrs.   He has had Endoscopy exam  Gall bladder removed  He has tried  Weight loss and successful loosing  Weight now.     He has been diagnosed with esophagitis.   No bleeding no coffee grounds  No shortness of breath   He needs aspirin daily.        He also had the same problem with Lipitor.  He got a prior authorization.         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 He will stay on this for six months and hopes the brain will reprogram and pain will not continue. He is also on the Topamax.  Neuro: no longer experience dizziness. Balance has returned to basline    Past Medical History:   Diagnosis Date   . Allergic rhinitis due to other allergen    . Allergic rhinitis, cause unspecified 01/19/2009   . Ankylosing spondylitis ([place])    . Asthma 07/11/2013   . Chronic pain 02/16/2012   . Depressive disorder, not elsewhere classified 12/16/2008   . Esophageal reflux    . Hypertension 02/16/2012   . Hypertriglyceridemia 04/06/2015   . Hypothyroidism 09/11/2012   . Restless legs syndrome (RLS)    . Sleep apnea 08/20/2010   . Unspecified sleep apnea      Past Surgical History:   Procedure Laterality Date   . CHOLECYSTECTOMY     . COLONOSCOPY STOMA DX INCLUDING COLLJ [place]  2008    normal per patient   . ESOPHAGOGASTRODUODENOSCOPY TRANSORAL DIAGNOSTIC  2008    on prevacid    . UNLISTED PROCEDURE SHOULDER      left shoulder     family history includes Diabetes in his mother; Heart Attack in his maternal grandfather; Hypertension in his father and mother; Pancreatic Cancer in his paternal grandfather.  Past Surgical History:   Procedure Laterality Date   . CHOLECYSTECTOMY     . COLONOSCOPY STOMA DX INCLUDING COLLJ [place]  2008    normal per patient   . ESOPHAGOGASTRODUODENOSCOPY TRANSORAL DIAGNOSTIC  2008    on prevacid   . UNLISTED PROCEDURE SHOULDER      left shoulder     Social History     Social History   . Marital status: Married     Spouse name: N/A   . Number of children: N/A   . Years of education: N/A     Occupational History   . Not on file.     Social History Main Topics   . Smoking status: Former Research scientist (life sciences)   . Smokeless tobacco: Never Used   . Alcohol use No   . Drug use: No   . Sexual activity: Not on file     Other Topics Concern   .  Not on file     Social History Narrative    Recently moved from [place].  Works for [employer].  One daughter.  Sister is an Therapist, sports.         02/15/12    Lives with his wife.    Daughter is studious     Son has been diagnosed with mild asperger's syndrome.    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 Depressive disorder, not elsewhere classified   . Allergic rhinitis, cause unspecified   . Ankylosing spondylitis (Uncertain)   . Chronic pain   . Hypertension   . Hypothyroidism   . Foot pain   . Other acquired absence of organ   . Other postprocedural status(V45.89)   . Unspecified sleep apnea   . Asthma   . Cough   . Allergic rhinitis due to allergen   . Restless legs syndrome   . Anterior corneal dystrophy   . Sleep apnea   . Acute vestibular neuritis   . Hypertriglyceridemia   . Tonic pupillary reaction of left eye   . Type 2 diabetes mellitus without complication ([place])   . Benign paroxysmal positional vertigo due to bilateral vestibular disorder     Current Outpatient Prescriptions   Medication Sig  Dispense Refill   . AmLODIPine Besylate 10 MG Oral Tab One tablet every am 30 tablet 11   . Atorvastatin Calcium 20 MG Oral Tab Take 1 tablet (20 mg) by mouth daily. To lower cholesterol 90 tablet 0   . Benzonatate 200 MG Oral Cap take 1 capsule by mouth three times a day if needed for cough 30 capsule 0   . Blood Glucose Monitoring Suppl Kit Use to check blood sugar once daily 1 kit 0   . Budesonide 0.5 MG/2ML Inhalation Suspension Inhale 2 mL (0.5 mg) via nebulizer every 12 hours. 120 mL 1   . Budesonide-Formoterol Fumarate 80-4.5 MCG/ACT Inhalation Aerosol Inhale 2 puffs by mouth every 12 hours. 1 Inhaler 6   . Certolizumab Pegol (CIMZIA PREFILLED) 2 X 200 MG/ML Subcutaneous Kit Inject 200 mg under the skin every 2 weeks.     . ClonazePAM 0.5 MG Oral Tab Take 1/2 to 1 tablet twice daily as needed 10 tablet 0   . Clotrimazole-Betamethasone 1-0.05 % External Cream Apply 1 application topically 2 times a day as needed (rash). Apply to buttocks 45 g 2   . DULoxetine HCl 60 MG Oral CAPSULE ENTERIC COATED PARTICLES take 1 capsule by mouth once daily 30 capsule 0   . Esomeprazole Magnesium 40 MG Oral CAPSULE DELAYED RELEASE take 1 capsule by mouth twice a day ON AN EMPTY STOMACH 180 capsule 1   . Fluticasone Propionate 50 MCG/ACT Nasal Suspension Spray 1 spray into each nostril 2 times a day. 1 bottle 3   . Glucose Blood (BLOOD GLUCOSE TEST) In Vitro Strip Use 1 strip daily. Use to check blood sugar. 100 strip 1   . Ipratropium-Albuterol 20-100 MCG/ACT Inhalation Aero Soln Inhale 2 puffs by mouth every 4 hours as needed (cough). 1 Inhaler 11   . Lancets Thin Misc Use 1 each daily. 100 each 1   . Levothyroxine Sodium 88 MCG Oral Tab Take 1 tablet (88 mcg) by mouth daily on an empty stomach. 90 tablet 1   . Losartan Potassium 50 MG Oral Tab take 1 tablet by mouth twice a day 60 tablet 5   .  Meclizine HCl 25 MG Oral Tab Take 2 tablets (50 mg) by mouth 2 times a day as needed for dizziness or nausea/vomiting. 60 tablet 0    . Meloxicam 15 MG Oral Tab Take 1 tablet (15 mg) by mouth daily. 90 tablet 2   . MetFORMIN HCl ER 750 MG Oral TABLET SR 24 HR Take 1 tablet (750 mg) by mouth daily. 90 tablet 0   . Montelukast Sodium 10 MG Oral Tab take 1 tablet by mouth every evening 90 tablet 3   . Olopatadine HCl 0.2 % Ophthalmic Solution Place 1 drop in each EYE daily. 1 bottle 6   . Olopatadine HCl 0.2 % Ophthalmic Solution Place 1 drop in each EYE daily. 2.5 mL 2   . Olopatadine HCl 0.6 % Nasal Solution Spray 2 sprays into each nostril 2 times a day. 1 bottle 11   . Ondansetron 8 MG Oral TABLET DISPERSIBLE dissolve 1 tablet ON TONGUE AND SWALLOW every 8 hours if needed for nausea and vomiting 60 tablet 0   . Pregabalin 225 MG Oral Cap One tablet at dinner and one tablet at bedtime 60 capsule 3   . Respiratory Therapy Supplies (NEBULIZER) Does not apply Device Use every 6 hours as needed for other (chronic bronchitis). Chronic bronchitis 1 Device 0   . ROPINIRole HCl 0.25 MG Oral Tab By mouth, 1 tab (0.25 mg) once daily 1 to 3 hours before bedtime & in 2 days increase to  0.5 mg daily (2 tabs), and after 7 days to 1 mg daily. Dose may be further titrated upward in 0.5 mg increments every week until reaching a daily dose of 3 mg during week 6. Daily dose may be increased to a maximum of 4 mg beginning week 7. 120 tablet 2   . Spacer/Aero-Holding Chambers (AEROCHAMBER Z-STAT PLUS) Misc Use with inhaler as directed. 1 Device 1   . Spironolactone 25 MG Oral Tab Take 1 tablet (25 mg) by mouth daily. 1 tablet 0     No current facility-administered medications for this visit.          OBJECTIVE:  Constitution: patient appears lighter with significant weight loss. Smiling, engaged in discussion  Heart Regular rate and rhythm, S1 and S2 present without murmur  Lungs: Clear no rales, rhonchi, breath sounds equal  Abdomen: not distended, non tender, no masses noted  Mental: mood is normal    Health Maintenance reviewed - reviewed, reminded and  ordered.      ASSESSMENT AND PLAN    (Z12.11) Screening for colon cancer   Plan: OCCULT BLOOD BY IA, STL        (K21.0) GERD with esophagitis  Plan: Esomeprazole Magnesium 40 MG Oral CAPSULE         DELAYED RELEASE, Esomeprazole Magnesium 40 MG         Oral CAPSULE DELAYED RELEASE        (J45.40) Moderate persistent asthma, unspecified whether complicated  Plan: Albuterol Sulfate HFA 108 (90 Base) MCG/ACT         Inhalation Aero Soln             Orders per documented in this encounter  Patient instructions per documented in this encounter

## 2016-11-27 NOTE — Progress Notes (Signed)
Chronic Pain Urine Temperature:       Urine Temperature was 98.0

## 2016-11-27 NOTE — Progress Notes (Signed)
Reviewed eCare status with Patient:  NO    HEALTH MAINTENANCE:  Has the patient had any of these since their last visit?    Cervical screening/PAP: Not Due     Mammo: Not Due    Colon Screen: FIT DUE    Diabetic Eye Exam: Due Referral Pended      Have you seen a specialist since your last visit: No        HM Due:   Health Maintenance   Topic Date Due   . Diabetes Foot Exam  06/06/2016   . Diabetes A1c  07/03/2016   . Colorectal Cancer Screening (FOBT/FIT)  08/14/2016   . Diabetes Eye Exam  09/06/2016   . Depression Monitoring (PHQ-9)  09/21/2016   . Influenza Vaccine (Season Ended) 03/24/2017   . Lipid Disorders Screening  09/21/2020   . Tetanus Vaccine  06/25/2024   . Hepatitis C Screening  Addressed   . HIV Screening  Addressed           Future Appointments  Date Time Provider Department Center   11/27/2016 11:20 AM Daphane ShepherdMeyer, Delcie RochKerry Ellen, ARNP [facility]

## 2016-12-09 NOTE — Progress Notes (Signed)
[name] did not cancel and was not present for a scheduled appointment today.  Disposition: No show

## 2016-12-24 ENCOUNTER — Encounter (INDEPENDENT_AMBULATORY_CARE_PROVIDER_SITE_OTHER): Payer: Self-pay | Admitting: Family

## 2016-12-24 NOTE — Telephone Encounter (Signed)
Please see ecare.   Call to pharmacy to check what is needed for rx and to get alternatives for esomeprazole.  Pharmacy states not covered generic. alternatives: omeprazole and Pantoprazole.

## 2016-12-26 ENCOUNTER — Telehealth (INDEPENDENT_AMBULATORY_CARE_PROVIDER_SITE_OTHER): Payer: Self-pay | Admitting: Family

## 2016-12-26 DIAGNOSIS — K2100 Gastro-esophageal reflux disease with esophagitis, without bleeding: Secondary | ICD-10-CM

## 2016-12-26 MED ORDER — PANTOPRAZOLE SODIUM 40 MG OR TBEC
40.0000 mg | DELAYED_RELEASE_TABLET | Freq: Every morning | ORAL | 3 refills | Status: DC
Start: 2016-12-26 — End: 2017-01-06

## 2016-12-26 NOTE — Telephone Encounter (Signed)
Sent rx to pharmacy  Sent ecare to patient

## 2016-12-31 ENCOUNTER — Encounter (INDEPENDENT_AMBULATORY_CARE_PROVIDER_SITE_OTHER): Payer: Self-pay | Admitting: Family

## 2017-01-02 NOTE — Telephone Encounter (Signed)
Please refer to ecare msg and advise.

## 2017-01-03 ENCOUNTER — Other Ambulatory Visit: Payer: Self-pay

## 2017-01-06 MED ORDER — ESOMEPRAZOLE MAGNESIUM 40 MG OR CPDR
40.0000 mg | DELAYED_RELEASE_CAPSULE | Freq: Every day | ORAL | 1 refills | Status: DC
Start: 2017-01-06 — End: 2017-01-10

## 2017-01-06 NOTE — Telephone Encounter (Signed)
Without the paper work from the pharmacy I am not sure how I request prior authorization for the nexium 40 mg twice daily.      I have resent a prescription to the pharmacy and requested prior auth paper work if needed but not heard back.  Any suggestions are greatly appreciated.    Anthony Andrade, Blanchard KelchARNP, PhD

## 2017-01-06 NOTE — Addendum Note (Signed)
Addended by: [name], Maxie Debose ELLEN on: 01/06/2017 06:33 PM     Modules accepted: Orders

## 2017-01-10 MED ORDER — ESOMEPRAZOLE MAGNESIUM 40 MG OR CPDR
40.0000 mg | DELAYED_RELEASE_CAPSULE | Freq: Every day | ORAL | 6 refills | Status: DC
Start: 2017-01-10 — End: 2017-08-25

## 2017-01-10 NOTE — Addendum Note (Signed)
Addended by: [name], Kennetha Pearman ELLEN on: 01/10/2017 01:46 PM     Modules accepted: Orders

## 2017-01-10 NOTE — Telephone Encounter (Signed)
Following up on this TE,  Regarding twice daily Nexium 40 MG.     Nothing has been received from the pharmacy     Do you want to persue this,  In reviewing patients chart, he has not read your ecare message regarding a GI doctor

## 2017-02-19 ENCOUNTER — Telehealth (INDEPENDENT_AMBULATORY_CARE_PROVIDER_SITE_OTHER): Payer: Self-pay | Admitting: Family

## 2017-02-19 NOTE — Telephone Encounter (Signed)
No forms have been received yet. Forwarding to 02/20/17 to follow up.

## 2017-02-19 NOTE — Telephone Encounter (Signed)
**  Complete Incoming Call Section as well**    RETURN CALL: General message OK      ([facility] ONLY) Texting is an option for this clinic. If you would like us to use this option, which mobile phone number should we text to if we are unable to reach you? N.A    SUBJECT: General Message     REASON FOR REQUEST: Form faxed    MESSAGE: [employer] is faxing over a physician collaboration request form. Routing to FD

## 2017-04-24 ENCOUNTER — Other Ambulatory Visit: Payer: Self-pay

## 2017-04-28 ENCOUNTER — Encounter (HOSPITAL_BASED_OUTPATIENT_CLINIC_OR_DEPARTMENT_OTHER): Payer: BLUE CROSS/BLUE SHIELD | Admitting: Optometrist

## 2017-06-06 ENCOUNTER — Other Ambulatory Visit (INDEPENDENT_AMBULATORY_CARE_PROVIDER_SITE_OTHER): Payer: Self-pay | Admitting: Family

## 2017-06-06 DIAGNOSIS — R195 Other fecal abnormalities: Secondary | ICD-10-CM

## 2017-06-06 DIAGNOSIS — Z1211 Encounter for screening for malignant neoplasm of colon: Secondary | ICD-10-CM

## 2017-06-09 LAB — OCCULT BLOOD BY IA, STL: Occult Bld 1 Result: POSITIVE — AB

## 2017-06-10 NOTE — Progress Notes (Signed)
The results of your stool occult blood are abnormal.    I recommend a colonoscopy.   Please let me know if you are willing to have the colonoscopy.I did order this for you.    Fonnie MuKerry Janautica Netzley, Blanchard KelchARNP, PhD

## 2017-06-18 ENCOUNTER — Telehealth (HOSPITAL_BASED_OUTPATIENT_CLINIC_OR_DEPARTMENT_OTHER): Payer: Self-pay | Admitting: Gastroenterology

## 2017-06-18 NOTE — Telephone Encounter (Signed)
(  TEXTING IS AN OPTION FOR [facility] CLINICS ONLY)  Is this a [facility] clinic? No      RETURN CALL: Detailed message on voicemail only      SUBJECT:  Appointment Request     REASON FOR REQUEST/SYMPTOMS: R19.5 (ICD-10-CM) - 792.1 (ICD-9-CM) - Positive occult stool blood test; Z12.11 (ICD-10-CM) - V76.51 (ICD-9-CM) - Special screening for malignant neoplasms, colon; [id number] - REFERRAL TO GASTROENTEROLOGY-PROCEDURE  REFERRING PROVIDER: Blanchard KelchARNP Fonnie MuKerry Meyer  REQUEST APPOINTMENT WITH: Any available provider  REQUESTED DATE: Please discuss with the patient , TIME: Please discuss with the patient  UNABLE TO APPOINT BECAUSE: Per your clinic's SOP for not instructions.

## 2017-06-19 NOTE — Telephone Encounter (Signed)
Called patient; left voicemail to call back and schedule procedure

## 2017-06-20 ENCOUNTER — Telehealth (HOSPITAL_BASED_OUTPATIENT_CLINIC_OR_DEPARTMENT_OTHER): Payer: Self-pay | Admitting: Gastroenterology

## 2017-06-20 DIAGNOSIS — Z1211 Encounter for screening for malignant neoplasm of colon: Secondary | ICD-10-CM

## 2017-06-20 MED ORDER — PEG 3350-KCL-NABCB-NACL-NASULF 236 G OR SOLR
ORAL | 0 refills | Status: DC
Start: 2017-06-20 — End: 2017-08-25

## 2017-06-20 NOTE — Telephone Encounter (Signed)
Sharepoint Assessment Questions    Patient Reported Vitals       Estimated body mass index is 34.15 kg/m as calculated from the following:    Height as of 11/27/16: 5' 9.5" (1.765 m).    Weight as of 11/27/16: 234 lb 9.6 oz (106.4 kg).      General Questions:   1. Does the patient have sufficient understanding of English?    Yes     2. Is the patient able to provide consent?    Yes                                                                                                                               Patient Assessment:   Has the patient already been flagged to be scheduled with Anesthesia?   No     Have you had a previous Endoscopy?    Yes             Where - [facility]      When - 10 years ago       What Procedure Type? Colon            Do you have an allergy to narcotics or related substances?   No     Have you ever had any problem in the past with sedation (Different from General Anesthesia)?   No     Do you use methadone, suboxone, or any other narcotics?   No     Do you use oxygen at home?   No     Are you taking any Anti-coagulant or Anti-platelet Medications?    Yes       Which Medication are you taking?     Aspirin baby     Prescribing Provider's name?         Route Message to General GI Clinical Pool for further evaluation and mail/fax letter the to prescribing provider       Do you have any bleeding or coagulation disorders?                NO       Are you diabetic?     Yes      Prescribing Provider's name?     Dr. Jeri LagerMellati,[facility]     Advise patient to inform their provider & establish a plan of care for their diabetic meds      Are you on dialysis?   No     Do you have End Stage Liver Disease?    No          Do you have diagnosed Sleep Apnea?   Yes      Do you use a Sleep Apnea Machine?     Yes    Instruct patient to bring sleep apnea machine with them             Do you currently have a diagnosis of Congestive Health Failure or CHF  that you are being treated for?   No     Do you have a  Pacemaker or Defibrillator?   No     Are you a difficult IV start?   No      Is the patient a male and younger than 53 years old?   No      Are you wheelchair bound or have any mobility issues that make it diffcult to get onto a stretcher?   No      Is the patient scheduled for a Colonoscopy?    Yes      Do you have less than 2 bowel movements a week or are wheelchair bound?     No     Is this an Upper Endoscopy Sedation Case (EGD, EUS, PEG)?   No       Procedure Scheduling:   1. Procedure Type -  Colonoscopy      2. Procedure MD - Singla,Anand      3. Procedure Date - 06/26/17       4. Procedure Time - 1230pm       5. Procedure Check-In Time - 12pm        Patient Teaching:     1. Who received the teaching ? Patient       2. Was the topic of stopping iron supplements taught? Yes     3. Was the topic of blood thinners taught? Yes     4. Were instructions for taking current medications taught? Yes     5. Was out transportation policy taught? Yes     6. What is the name of the driver? Son or Wife         7. How was the preparation instruction materials delivered?    Verbal and Email     8. Does this procedure require bowel prep?    Yes -      1. Were the instructions for the ordered laxatives taught? Yes        2. Which Rx was prescribed ?     Golytely

## 2017-06-20 NOTE — Telephone Encounter (Signed)
E-mailed prep instructions for GoLytely to patients email, e-mailed to [email or web address]  [facility], pended RX to provider to sign    If patient calls back, transfer pt to [phone], [place] for pt to leave a voicemail.

## 2017-06-26 ENCOUNTER — Ambulatory Visit: Payer: BLUE CROSS/BLUE SHIELD | Attending: Gastroenterology | Admitting: Gastroenterology

## 2017-06-26 ENCOUNTER — Other Ambulatory Visit (HOSPITAL_BASED_OUTPATIENT_CLINIC_OR_DEPARTMENT_OTHER): Payer: Self-pay | Admitting: Gastroenterology

## 2017-06-26 DIAGNOSIS — K5730 Diverticulosis of large intestine without perforation or abscess without bleeding: Secondary | ICD-10-CM

## 2017-06-26 DIAGNOSIS — K635 Polyp of colon: Secondary | ICD-10-CM

## 2017-06-26 DIAGNOSIS — R195 Other fecal abnormalities: Secondary | ICD-10-CM | POA: Insufficient documentation

## 2017-06-29 NOTE — Progress Notes (Signed)
The results of your Colonoscopy were normal except for one polyp. Repeat in 5 years.    Continue high fiber diet!        Fonnie MuKerry Delita Chiquito, Blanchard KelchARNP, PhD

## 2017-06-29 NOTE — Progress Notes (Signed)
Colonoscopy was negative.  Updated HM

## 2017-06-30 LAB — PATHOLOGY, SURGICAL

## 2017-07-03 ENCOUNTER — Encounter (INDEPENDENT_AMBULATORY_CARE_PROVIDER_SITE_OTHER): Payer: Self-pay | Admitting: Gastroenterology

## 2017-07-04 NOTE — Progress Notes (Signed)
This encounter was opened in error.

## 2017-08-25 ENCOUNTER — Ambulatory Visit (INDEPENDENT_AMBULATORY_CARE_PROVIDER_SITE_OTHER): Payer: Self-pay

## 2017-08-25 ENCOUNTER — Encounter (INDEPENDENT_AMBULATORY_CARE_PROVIDER_SITE_OTHER): Payer: Self-pay

## 2017-08-25 ENCOUNTER — Encounter (INDEPENDENT_AMBULATORY_CARE_PROVIDER_SITE_OTHER): Payer: Self-pay | Admitting: Family

## 2017-08-25 ENCOUNTER — Encounter (INDEPENDENT_AMBULATORY_CARE_PROVIDER_SITE_OTHER): Payer: BLUE CROSS/BLUE SHIELD | Admitting: Family

## 2017-08-25 ENCOUNTER — Ambulatory Visit (INDEPENDENT_AMBULATORY_CARE_PROVIDER_SITE_OTHER): Payer: BLUE CROSS/BLUE SHIELD | Admitting: Family

## 2017-08-25 VITALS — BP 138/84 | HR 86 | Temp 98.9°F | Resp 14 | Ht 69.5 in | Wt 252.0 lb

## 2017-08-25 DIAGNOSIS — J4521 Mild intermittent asthma with (acute) exacerbation: Secondary | ICD-10-CM

## 2017-08-25 DIAGNOSIS — G4730 Sleep apnea, unspecified: Secondary | ICD-10-CM

## 2017-08-25 DIAGNOSIS — R079 Chest pain, unspecified: Secondary | ICD-10-CM

## 2017-08-25 DIAGNOSIS — J4532 Mild persistent asthma with status asthmaticus: Secondary | ICD-10-CM

## 2017-08-25 DIAGNOSIS — R21 Rash and other nonspecific skin eruption: Secondary | ICD-10-CM

## 2017-08-25 DIAGNOSIS — J4540 Moderate persistent asthma, uncomplicated: Secondary | ICD-10-CM

## 2017-08-25 DIAGNOSIS — Z6836 Body mass index (BMI) 36.0-36.9, adult: Secondary | ICD-10-CM

## 2017-08-25 DIAGNOSIS — R0602 Shortness of breath: Secondary | ICD-10-CM

## 2017-08-25 MED ORDER — FLUTICASONE-SALMETEROL 250-50 MCG/ACT IN AEPB
1.0000 | INHALATION_SPRAY | Freq: Two times a day (BID) | RESPIRATORY_TRACT | 2 refills | Status: DC
Start: 2017-08-25 — End: 2019-09-22

## 2017-08-25 MED ORDER — CLOTRIMAZOLE-BETAMETHASONE 1-0.05 % EX CREA
1.0000 | TOPICAL_CREAM | Freq: Two times a day (BID) | CUTANEOUS | 6 refills | Status: DC | PRN
Start: 2017-08-25 — End: 2019-09-22

## 2017-08-25 MED ORDER — IPRATROPIUM-ALBUTEROL 20-100 MCG/ACT IN AERS
2.0000 | INHALATION_SPRAY | RESPIRATORY_TRACT | 11 refills | Status: DC | PRN
Start: 2017-08-25 — End: 2019-09-22

## 2017-08-25 NOTE — Progress Notes (Signed)
HEALTH MAINTENANCE:    Immunizations: Due   Health Maintenance Topics with due status: Overdue       Topic Date Due    Diabetes Foot Exam 06/06/2016    Diabetes Eye Exam 09/06/2016    Depression Monitoring (PHQ-9) 09/21/2016    Zoster Vaccine 10/04/2016    Influenza Vaccine 03/24/2017     Cervical Cancer Screening:    There are no Cervical Cancer Screening (Pap Smear) preventive care reminders to display for this patient.  There are no Chlamydia Screening preventive care reminders to display for this patient.    Breast Cancer Screening:    There are no Breast Cancer Screening preventive care reminders to display for this patient.    Colorectal Cancer Screening:    Health Maintenance   Topic Date Due   . Colorectal Cancer Screening (Colonoscopy)  06/26/2018     There are no Colorectal Cancer Screening (FOBT/FIT) preventive care reminders to display for this patient.    Diabetic Foot Exam:    Health Maintenance   Topic Date Due   . Diabetes Foot Exam  06/06/2016       Diabetic Eye Exam:    Health Maintenance   Topic Date Due   . Diabetes Eye Exam  09/06/2016       Reviewed eCare status with Patient:  YES    Have you seen a specialist since your last visit? No      No future appointments.

## 2017-08-25 NOTE — Progress Notes (Signed)
CC: Anthony Andrade is a 52 year old male here today with multiple concerns including URI and off work since last Friday.    1. He has hx of asthma and has been short of breath due to his cold. Needs asthma medication. Congestion, aches, headache and cough for almost one week.  -OTC medications not helping except inhaler.  -He also states he has experienced chest pain with exercise. Dull heaviness.     2: Chronic sleeping problems  -Patient goes to sleep and wakes up feeling like he can't breath.  -He is on  CPAP and rips it off. He sometime takes it off in the middle of the night. -In about two hours, it will occur.    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 No distress.   HENT:   Right Ear: Tympanic membrane normal.   Left Ear: Tympanic membrane normal.   Nose: Mucosal edema and rhinorrhea present. Right sinus exhibits no maxillary sinus tenderness and no frontal sinus tenderness. Left sinus exhibits no maxillary sinus tenderness and no frontal sinus tenderness.   Mouth/Throat: Mucous membranes are normal. Posterior oropharyngeal erythema present. No oropharyngeal exudate or posterior oropharyngeal edema.   Cardiovascular: Normal rate, regular rhythm, S1 normal and S2 normal. Exam reveals no gallop, no S3, no S4, no distant heart sounds and no friction rub.   No murmur heard.  Pulmonary/Chest: No accessory muscle usage. No respiratory distress. He has no decreased breath sounds. He has wheezes. He has rhonchi. He has no rales.   Skin: Rash noted.   Psychiatric: He has a normal mood and affect.     ASSESSMENT AND PLAN:    (R06.02) Shortness of breath on exertion  (primary encounter  diagnosis)  Plan: EKG 12-LEAD, XR CHEST 2 VW,         Ipratropium-Albuterol 20-100 MCG/ACT Inhalation        Aero Soln           (G47.30) Sleep apnea, unspecified type with chest heaviness  Plan: XR CHEST 2 VW  Negative    (R07.9) Chest pain, unspecified type  Plan: EKG 12-LEAD  Negative     (J45.32) Mild persistent asthma with status asthmaticus  Plan: Ipratropium-Albuterol 20-100 MCG/ACT Inhalation        Aero Soln, Fluticasone-Salmeterol 250-50         MCG/DOSE Inhalation AEROSOL POWDER, BREATH         ACTIVATED      (R21) Rash chronic, fungal in the past  Plan: Clotrimazole-Betamethasone 1-0.05 % External         Cream-refill

## 2017-08-25 NOTE — Patient Instructions (Addendum)
It was a pleasure to see you in clinic today.               Unfortunately - Lyrica (Pregabalin) can also impact mental clarity.  You may want to look at this medication as a possible suspect for your concern.       PHARMA GABA  -     or             If you are not yet signed up for eCare, please see the below eCare section at the end of this document for how to enroll and to get your access codes.  It's easy to sign up at home today.    eCare  enrollment will allow you access to the below benefits   You can make appointments online   View test results / Lab Results   Request prescription renewals   Obtain a copy of our After Visit Summary (an electronic copy of this document)     Your Test Results:  If labs were ordered today the results are expected to be available via eCare in about 5 days. If you have an active eCare account, this is how we will notify you of your results.     If you do not have an eCare account then your test results will be mailed to you within about 14 days after your tests are completed. If your physician needs to change your care based on your results or is concerned, you should expect a phone call or eCare message from your provider.    If you have any questions about your test results please schedule an appointment with your provider, so that we may review them with you in greater detail.    **If it has been more than 2 weeks and you have not received your test results please send our office a message via eCare.    Medication Refills: If you need a prescription refilled, please contact your pharmacy 1 week before your current supply will run out to request the refill.  Contacting your pharmacy is the fastest and safest way to obtain a medication refill.  The pharmacy will notify our office.  Please note, that a minimum of 48 to 72 hours is needed to refill a medication,  Please call your pharmacy early to allow enough time to refill before you anticipate running out.  For faster  medication refills, you can also schedule an appointment with your provider.    We know you have a choice in where you receive your healthcare and we sincerely thank you for [facility] with your health.

## 2017-08-26 LAB — EKG 12 LEAD
Atrial Rate: 72 {beats}/min
Diagnosis: NORMAL
P Axis: 25 degrees
P-R Interval: 116 ms
Q-T Interval: 386 ms
QRS Duration: 80 ms
QTC Calculation: 422 ms
R Axis: 43 degrees
T Axis: 42 degrees
Ventricular Rate: 72 {beats}/min

## 2017-08-27 ENCOUNTER — Encounter (INDEPENDENT_AMBULATORY_CARE_PROVIDER_SITE_OTHER): Payer: Self-pay | Admitting: Family

## 2017-08-28 ENCOUNTER — Encounter (INDEPENDENT_AMBULATORY_CARE_PROVIDER_SITE_OTHER): Payer: Self-pay | Admitting: Family

## 2017-08-28 DIAGNOSIS — J40 Bronchitis, not specified as acute or chronic: Secondary | ICD-10-CM

## 2017-08-28 MED ORDER — BENZONATATE 200 MG OR CAPS
200.0000 mg | ORAL_CAPSULE | Freq: Three times a day (TID) | ORAL | 0 refills | Status: DC | PRN
Start: 2017-08-28 — End: 2018-07-31

## 2017-08-28 NOTE — Result Encounter Note (Signed)
EKG was also read as normal by Cardiology.  Please let me know if you have any question.

## 2017-08-28 NOTE — Progress Notes (Signed)
Mr. Jennefer Bravorens did not cancel and was not present for a scheduled appointment today.  Disposition: r/s

## 2017-08-28 NOTE — Result Encounter Note (Signed)
Xray of chest has actually improved with increase lung volume. Deep breathing!  Please let me know if you have any question.

## 2017-09-02 ENCOUNTER — Telehealth (INDEPENDENT_AMBULATORY_CARE_PROVIDER_SITE_OTHER): Payer: Self-pay | Admitting: Family

## 2017-09-02 ENCOUNTER — Ambulatory Visit (INDEPENDENT_AMBULATORY_CARE_PROVIDER_SITE_OTHER): Payer: BLUE CROSS/BLUE SHIELD | Admitting: Family Medicine

## 2017-09-02 ENCOUNTER — Encounter (INDEPENDENT_AMBULATORY_CARE_PROVIDER_SITE_OTHER): Payer: Self-pay | Admitting: Family Medicine

## 2017-09-02 VITALS — BP 126/83 | HR 90 | Temp 98.2°F | Resp 16 | Wt 252.0 lb

## 2017-09-02 DIAGNOSIS — Z6836 Body mass index (BMI) 36.0-36.9, adult: Secondary | ICD-10-CM

## 2017-09-02 DIAGNOSIS — J209 Acute bronchitis, unspecified: Secondary | ICD-10-CM

## 2017-09-02 DIAGNOSIS — J4541 Moderate persistent asthma with (acute) exacerbation: Secondary | ICD-10-CM

## 2017-09-02 MED ORDER — ALBUTEROL SULFATE (2.5 MG/3ML) 0.083% IN NEBU
2.5000 mg | INHALATION_SOLUTION | RESPIRATORY_TRACT | 0 refills | Status: DC | PRN
Start: 2017-09-02 — End: 2017-09-05

## 2017-09-02 MED ORDER — AZITHROMYCIN 250 MG OR TABS
ORAL_TABLET | ORAL | 0 refills | Status: DC
Start: 2017-09-02 — End: 2018-07-31

## 2017-09-02 MED ORDER — PREDNISONE 20 MG OR TABS
40.0000 mg | ORAL_TABLET | Freq: Every day | ORAL | 0 refills | Status: AC
Start: 2017-09-02 — End: 2017-09-07

## 2017-09-02 NOTE — Patient Instructions (Signed)
Patient Education     Acute Bronchitis  Your healthcare provider has told you that you have acute bronchitis. Bronchitis is infection or inflammation of the bronchial tubes (airways in the lungs). Normally, air moves easily in and out of the airways. Bronchitis narrows the airways, making it harder for air to flow in and out of the lungs. This causes symptoms such as shortness of breath, coughing up yellow or green mucus, and wheezing. Bronchitis can be acute or chronic. Acute means the condition comes on quickly and goes away in a short time, usually within 3 to 10 days. Chronic means a condition lasts a long time and often comes back.    What causes acute bronchitis?  Acute bronchitis almost always starts as a viral respiratory infection, such as a cold or the flu. Certain factors make it more likely for a cold or flu to turn into bronchitis. These include being very young, being elderly, having a heart or lung problem, or having a weak immune system. Cigarette smoking also makes bronchitis more likely.  When bronchitis develops, the airways become swollen. The airways may also become infected with bacteria. This is known as a secondary infection.  Diagnosing acute bronchitis  Your healthcare provider will examine you and ask about your symptoms and health history. You may also have a sputum culture to test the fluid in your lungs. Chest X-rays may be done to look for infection in the lungs.  Treating acute bronchitis  Bronchitis usually clears up as the cold or flu goes away. You can help feel better faster by doing the following:   Take medicine as directed. You may be told to take ibuprofen or other over-the-counter medicines. These help relieve inflammation in your bronchial tubes. Your healthcare provider may prescribe an inhaler to help open up the bronchial tubes. Most of the time,acute bronchitisis caused by a viral infection. Antibiotics are usually not prescribed for viral infections.   Drink plenty  of fluids, such as water, juice, or warm soup. Fluids loosen mucus so that you can cough it up. This helps you breathe more easily. Fluids also prevent dehydration.   Make sure you get plenty of rest.   Do not smoke. Do not allow anyone else to smoke in your home.  Recovery and follow-up  Follow up with your doctor as you are told. You will likely feel better in a week or two. But a dry cough can linger beyond that time. Let your doctor know if you still have symptoms (other than a dry cough) after 2 weeks, or if you're prone to getting bronchial infections. Take steps to protect yourself from future infections. These steps include stopping smoking and avoiding tobacco smoke, washing your hands often, and getting a yearly flu shot.  When to call your healthcare provider  Call the healthcare provider if you have any of the following:   Fever of 100.4F (38.0C) or higher, or as advised   Symptoms that get worse, or new symptoms   Trouble breathing   Symptoms that don't start to improve within a week, or within 3 days of taking antibiotics   Date Last Reviewed: 05/25/2015   2000-2017 [employer] [address], [place] [address]. All rights reserved. This information is not intended as a substitute for professional medical care. Always follow your healthcare professional's instructions.

## 2017-09-02 NOTE — Telephone Encounter (Signed)
Received a FMLA  form via patient drop off. Form placed in [place] [facility]'s in box to complete    Main contact for form if there are questions/concerns: Eda KeysMartin Parfait  Phone #: [phone] and Fax to [phone]    Patient would like to pick up when complete and after being faxed (or wife Scherrie MerrittsLeanne Hodo [phone], ok per patient)   Please route back to the [employer]

## 2017-09-02 NOTE — Telephone Encounter (Signed)
Received a FMLA  form via fax. [facility] to [facility]    Main contact for form if there are questions/concerns: [employer]  Phone #: [phone]  Fax # [phone]  Please route back to the [employer]

## 2017-09-02 NOTE — Progress Notes (Signed)
Chief Complaint   Patient presents with   . Cough     cough, congestion, sob, wheezing, runny/ stuffy nose, X 02/28       SUBJECTIVE:  Anthony Andrade is an 52 year old male who presents with cough, congestion, sob, wheezing, and runny nose x 2 weeks. Was seen by PCP on 08/25/2017 for asthma exacerbation.  Other family member sick with similar symptoms.  No recent travel.  Has been using his albuterol MDI 3-4 times daily, Advair, Tessalon Perles.          Outpatient Medications Prior to Visit   Medication Sig Dispense Refill   . Albuterol Sulfate HFA 108 (90 Base) MCG/ACT Inhalation Aero Soln Inhale 2 puffs by mouth every 4 hours as needed for shortness of breath/wheezing. 1 Inhaler 2   . AmLODIPine Besylate 10 MG Oral Tab One tablet every am 30 tablet 11   . ASPIRIN 81 OR      . Atorvastatin Calcium 20 MG Oral Tab Take 1 tablet (20 mg) by mouth daily. To lower cholesterol 90 tablet 0   . Benzonatate 200 MG Oral Cap Take 1 capsule (200 mg) by mouth 3 times a day as needed for cough. 30 capsule 0   . Blood Glucose Monitoring Suppl Kit Use to check blood sugar once daily 1 kit 0   . Certolizumab Pegol (CIMZIA PREFILLED) 2 X 200 MG/ML Subcutaneous Kit Inject 200 mg under the skin every 2 weeks.     . Clotrimazole-Betamethasone 1-0.05 % External Cream Apply 1 application topically 2 times a day as needed (rash). Apply to buttocks 45 g 6   . Erenumab-aooe 70 MG/ML Subcutaneous Solution Auto-injector Inject 140 mg under the skin every 4 weeks.     . Fluticasone-Salmeterol 250-50 MCG/DOSE Inhalation AEROSOL POWDER, BREATH ACTIVATED Inhale 1 puff by mouth 2 times a day. 14 disk 2   . Glucose Blood (BLOOD GLUCOSE TEST) In Vitro Strip Use 1 strip daily. Use to check blood sugar. 100 strip 1   . Ipratropium-Albuterol 20-100 MCG/ACT Inhalation Aero Soln Inhale 2 puffs by mouth every 4 hours as needed (cough). 1 Inhaler 11   . Lancets Thin Misc Use 1 each daily. 100 each 1   . Levothyroxine Sodium 88 MCG Oral Tab Take 1 tablet  (88 mcg) by mouth daily on an empty stomach. 90 tablet 1   . Losartan Potassium 50 MG Oral Tab take 1 tablet by mouth twice a day 60 tablet 5   . Meloxicam 15 MG Oral Tab Take 1 tablet (15 mg) by mouth daily. 90 tablet 2   . MetFORMIN HCl ER 750 MG Oral TABLET SR 24 HR Take 1 tablet (750 mg) by mouth daily. 90 tablet 0   . Ondansetron 8 MG Oral TABLET DISPERSIBLE dissolve 1 tablet ON TONGUE AND SWALLOW every 8 hours if needed for nausea and vomiting 60 tablet 0   . Pregabalin 225 MG Oral Cap One tablet at dinner and one tablet at bedtime 60 capsule 3   . Spironolactone 25 MG Oral Tab Take 1 tablet (25 mg) by mouth daily. 1 tablet 0     No facility-administered medications prior to visit.        Review of patient's allergies indicates:  Allergies   Allergen Reactions   . Morphine Nausea/Vomiting   . Penicillins Rash       Social History     Tobacco Use   . Smoking status: Former Research scientist (life sciences)   . Smokeless tobacco:  Never Used   Substance Use Topics   . Alcohol use: No       I personally reviewed and confirmed the ROS, past medical history, past surgical history, family history and social history in the record with the patient today.      Review of Systems   Constitutional: Negative for chills and fever.   HENT: Positive for congestion, postnasal drip, rhinorrhea and sinus pressure. Negative for ear discharge, ear pain, sinus pain and sore throat.    Respiratory: Positive for cough, shortness of breath and wheezing.    Gastrointestinal: Negative for diarrhea, nausea and vomiting.   Neurological: Positive for headaches.       OBJECTIVE:  BP 126/83   Pulse 90   Temp 98.2 F (36.8 C) (Oral)   Resp 16   Wt (!) 252 lb (114.3 kg)   SpO2 96%   BMI 36.68 kg/m   Physical Exam   Constitutional: He is oriented to person, place, and time. He appears well-developed and well-nourished. No distress.   HENT:   Head: Normocephalic and atraumatic.   Right Ear: Tympanic membrane, external ear and ear canal normal.   Left Ear: Tympanic  membrane, external ear and ear canal normal.   Nose: Mucosal edema and rhinorrhea present. Right sinus exhibits no maxillary sinus tenderness and no frontal sinus tenderness. Left sinus exhibits no maxillary sinus tenderness and no frontal sinus tenderness.   Mouth/Throat: Oropharynx is clear and moist and mucous membranes are normal.   Neck: Normal range of motion. Neck supple.   Cardiovascular: Normal rate, regular rhythm and normal heart sounds.   No murmur heard.  Pulmonary/Chest: Effort normal and breath sounds normal. No respiratory distress. He has no wheezes. He has no rales.   Neurological: He is alert and oriented to person, place, and time.   Skin: He is not diaphoretic.   Nursing note and vitals reviewed.      ASSESSMENT/PLAN  (J20.9) Acute bronchitis with symptoms > 10 days  (primary encounter diagnosis)  Plan: Azithromycin (ZITHROMAX) 250 MG Oral Tab    (J45.41) Moderate persistent asthma with acute exacerbation  Plan: predniSONE 20 MG Oral Tab, Albuterol Sulfate         (2.5 MG/3ML) 0.083% Inhalation Nebu Soln    1.  Increase fluids, rest, nasal saline  2.  F/u with primary care in 1-2 weeks, if symptoms don't improve  If symptoms worsen RTC or go to the ER for further evaluation and treatment.  3.  Zpack, Prednisone, Albuterol MDI      Discussed medications in detail including dosing, side effects, and interactions.    Fulton Mole, MD  [place]

## 2017-09-03 ENCOUNTER — Encounter (INDEPENDENT_AMBULATORY_CARE_PROVIDER_SITE_OTHER): Payer: Self-pay | Admitting: Family

## 2017-09-03 NOTE — Telephone Encounter (Signed)
Pt's disability manager Anthony Andrade calling to follow up, insure that form had been received. Confirmed that we have received form, is in provider's inbox and she will get to it when she has a chance.    Routing to provider.

## 2017-09-03 NOTE — Telephone Encounter (Signed)
Please refer to ecare msg and advise.

## 2017-09-04 ENCOUNTER — Telehealth (INDEPENDENT_AMBULATORY_CARE_PROVIDER_SITE_OTHER): Payer: Self-pay | Admitting: Family

## 2017-09-04 DIAGNOSIS — J302 Other seasonal allergic rhinitis: Secondary | ICD-10-CM

## 2017-09-04 NOTE — Telephone Encounter (Signed)
Waiting for response from Mr. Moris. FMLA denied due to short of work hours.

## 2017-09-04 NOTE — Telephone Encounter (Signed)
Received a FMLA  form via fax. Form placed in Dr.Meyer, Kerry's in box to complete    Main contact for form if there are questions/concerns: [employer]  Phone #: [phone]  Fax # [phone]  Please route back to the [employer]

## 2017-09-04 NOTE — Telephone Encounter (Signed)
Spoke with Disability office  Requested new form since patient filled out my section.

## 2017-09-04 NOTE — Telephone Encounter (Signed)
Called patient left message asking him to return our call if he would like his work excuse letter be mailed to him, faxed or picked up at the clinic.  Letter placed in patient pick up filing folder.

## 2017-09-08 ENCOUNTER — Ambulatory Visit (INDEPENDENT_AMBULATORY_CARE_PROVIDER_SITE_OTHER): Payer: BLUE CROSS/BLUE SHIELD | Admitting: Family

## 2017-09-08 ENCOUNTER — Encounter (INDEPENDENT_AMBULATORY_CARE_PROVIDER_SITE_OTHER): Payer: Self-pay | Admitting: Family

## 2017-09-08 VITALS — BP 128/80 | HR 82 | Temp 97.2°F | Resp 13 | Ht 69.5 in | Wt 253.0 lb

## 2017-09-08 DIAGNOSIS — J4551 Severe persistent asthma with (acute) exacerbation: Secondary | ICD-10-CM

## 2017-09-08 DIAGNOSIS — Z6836 Body mass index (BMI) 36.0-36.9, adult: Secondary | ICD-10-CM

## 2017-09-08 DIAGNOSIS — R0989 Other specified symptoms and signs involving the circulatory and respiratory systems: Secondary | ICD-10-CM

## 2017-09-08 DIAGNOSIS — J301 Allergic rhinitis due to pollen: Secondary | ICD-10-CM

## 2017-09-08 DIAGNOSIS — J40 Bronchitis, not specified as acute or chronic: Secondary | ICD-10-CM | POA: Insufficient documentation

## 2017-09-08 DIAGNOSIS — R069 Unspecified abnormalities of breathing: Secondary | ICD-10-CM

## 2017-09-08 HISTORY — DX: Bronchitis, not specified as acute or chronic: J40

## 2017-09-08 MED ORDER — NEBULIZER DEVI
0 refills | Status: AC
Start: 2017-09-08 — End: ?

## 2017-09-08 MED ORDER — MONTELUKAST SODIUM 10 MG OR TABS
10.0000 mg | ORAL_TABLET | Freq: Every evening | ORAL | 3 refills | Status: DC
Start: 2017-09-08 — End: 2023-06-03

## 2017-09-08 MED ORDER — BUDESONIDE-FORMOTEROL FUMARATE 80-4.5 MCG/ACT IN AERO
2.0000 | INHALATION_SPRAY | Freq: Two times a day (BID) | RESPIRATORY_TRACT | 3 refills | Status: DC
Start: 2017-09-08 — End: 2023-07-23

## 2017-09-08 MED ORDER — OLOPATADINE HCL 0.6 % NA SOLN
2.0000 | Freq: Two times a day (BID) | NASAL | 6 refills | Status: DC
Start: 2017-09-08 — End: 2022-01-27

## 2017-09-08 NOTE — Telephone Encounter (Signed)
Received the completed FMLA form from Dr. Daphane ShepherdMeyer.       Form faxed back to [facility] at  fax # [phone] phone # .      All faxed items will be kept in fax bin for 2 weeks and then sorted to:  (1) A copy of any signed documents and home health orders will be kept on site for 60 days.  (2) L&I, FMLA and Initial/Discharge physical therapy will be scanned to the chart.  (3) All other items will be shred after 2 weeks.       Copy of form has been scanned and a copy given to patient.

## 2017-09-08 NOTE — Telephone Encounter (Signed)
All the notes and forms for his work are ready to be faxed and scanned.  Patient may also want a copy

## 2017-09-08 NOTE — Telephone Encounter (Signed)
Patient coming in today  

## 2017-09-08 NOTE — Progress Notes (Signed)
HEALTH MAINTENANCE:    Immunizations: UTD  Health Maintenance Topics with due status: Overdue       Topic Date Due    Diabetes Foot Exam 06/06/2016    Diabetes Eye Exam 09/06/2016    Zoster Vaccine 10/04/2016    Influenza Vaccine 03/24/2017     Cervical Cancer Screening:    There are no Cervical Cancer Screening (Pap Smear) preventive care reminders to display for this patient.  There are no Chlamydia Screening preventive care reminders to display for this patient.    Breast Cancer Screening:    There are no Breast Cancer Screening preventive care reminders to display for this patient.    Colorectal Cancer Screening:    Health Maintenance   Topic Date Due   . Colorectal Cancer Screening (Colonoscopy)  06/26/2018     There are no Colorectal Cancer Screening (FOBT/FIT) preventive care reminders to display for this patient.    Diabetic Foot Exam: Due - Procedure Pended  Health Maintenance   Topic Date Due   . Diabetes Foot Exam  06/06/2016       Diabetic Eye Exam: Due - Date of last in-person exam:   Health Maintenance   Topic Date Due   . Diabetes Eye Exam  09/06/2016       Reviewed eCare status with Patient:  YES    Have you seen a specialist since your last visit? No      Future Appointments   Date Time Provider Department Center   09/08/2017  9:40 AM Daphane ShepherdMeyer, Delcie RochKerry Ellen, ARNP [facility]       Answers for HPI/ROS submitted by the patient on 09/06/2017   PHQ9 SCORE: 5

## 2017-09-08 NOTE — Patient Instructions (Signed)
It was a pleasure to see you in clinic today.                If you are not yet signed up for eCare, please see the below eCare section at the end of this document for how to enroll and to get your access codes.  It's easy to sign up at home today.    eCare  enrollment will allow you access to the below benefits   You can make appointments online   View test results / Lab Results   Request prescription renewals   Obtain a copy of our After Visit Summary (an electronic copy of this document)     Your Test Results:  If labs were ordered today the results are expected to be available via eCare in about 5 days. If you have an active eCare account, this is how we will notify you of your results.     If you do not have an eCare account then your test results will be mailed to you within about 14 days after your tests are completed. If your physician needs to change your care based on your results or is concerned, you should expect a phone call or eCare message from your provider.    If you have any questions about your test results please schedule an appointment with your provider, so that we may review them with you in greater detail.    **If it has been more than 2 weeks and you have not received your test results please send our office a message via eCare.    Medication Refills: If you need a prescription refilled, please contact your pharmacy 1 week before your current supply will run out to request the refill.  Contacting your pharmacy is the fastest and safest way to obtain a medication refill.  The pharmacy will notify our office.  Please note, that a minimum of 48 to 72 hours is needed to refill a medication,  Please call your pharmacy early to allow enough time to refill before you anticipate running out.  For faster medication refills, you can also schedule an appointment with your provider.    We know you have a choice in where you receive your healthcare and we sincerely thank you for trusting [facility] with your health.

## 2017-09-08 NOTE — Progress Notes (Signed)
[facility]     Chief Complaint:    [facility] - Follow up for sinus infection and cough.     History of Present Complaint(s):    Patient started with bronchitis and asthma excacerbation 3/1. He has been on inhalers, steriods and antibiotics. He is much improved. Going back to work today.  He does have significant allergies and needs refills of some of his medication.     - no further shortness of breath. Continues to cough but manageable.  - no additional fatigue.  -dizziness is chronic, no worse.  -no fever  -sinus pain improved  -He does have sleep apnea and seeing sleep doctor soon      Review of Systems:  All other ROS reviewed and negative unless as noted in the HPI.    Medical history:  I personally reviewed and confirmed the ROS and past medical history in the record with the patient today.    Physical Exam:  General appearance: healthy, alert, no distress. pbese  Nose: Normal  Oropharynx: Normal  Lungs: Clear to auscultation but shallow breather.  Heart: Normal rate, regular rhythm and no murmurs no clicks no gallops  Psych: Mood and affect are normal      _________________________________________________  ASSESSMENT/PLAN::      (J45.51) Severe persistent asthma with acute exacerbation  (primary encounter diagnosis)  Plan: Montelukast Sodium 10 MG Oral Tab,         Budesonide-Formoterol Fumarate 80-4.5 MCG/ACT         Inhalation Aerosol, REFERRAL TO RESP THERAPY,         Respiratory Therapy Supplies (NEBULIZER) Device    Also suggested portable nebulizer    (J30.1) Seasonal allergic rhinitis due to pollen  Plan: Olopatadine HCl 0.6 % Nasal Solution,         Budesonide-Formoterol Fumarate 80-4.5 MCG/ACT         Inhalation Aerosol, Respiratory Therapy         Supplies (NEBULIZER) Device  Reviewd Dr. Tracie HarrierAyer's suggested plan of care for asthma and allergies    (R06.9) Shallow breathing  Plan: REFERRAL TO RESP THERAPY       Suggested he see RT for rehab of his  breathing practices. Very shallow breathing.         Health Maintenance screening      Follow-up:  As needed if symptoms persist/worsen        --  Fonnie MuKerry Orvella Digiulio, PhD, ARNP  Family Medicine  [facility]  Phone #[phone]  Fax # [phone]          All the notes and forms for his work are ready to be faxed and scanned.  Patient may also want a copy  Answers for HPI/ROS submitted by the patient on 09/06/2017   PHQ9 SCORE: 5

## 2017-09-09 ENCOUNTER — Encounter (INDEPENDENT_AMBULATORY_CARE_PROVIDER_SITE_OTHER): Payer: BLUE CROSS/BLUE SHIELD | Admitting: Family

## 2017-09-12 ENCOUNTER — Encounter (INDEPENDENT_AMBULATORY_CARE_PROVIDER_SITE_OTHER): Payer: BLUE CROSS/BLUE SHIELD | Admitting: Family

## 2017-10-02 ENCOUNTER — Encounter (INDEPENDENT_AMBULATORY_CARE_PROVIDER_SITE_OTHER): Payer: Self-pay

## 2017-10-14 ENCOUNTER — Telehealth (INDEPENDENT_AMBULATORY_CARE_PROVIDER_SITE_OTHER): Payer: Self-pay | Admitting: Family

## 2017-10-14 NOTE — Telephone Encounter (Signed)
Sent eCare message.

## 2017-11-22 ENCOUNTER — Encounter (INDEPENDENT_AMBULATORY_CARE_PROVIDER_SITE_OTHER): Payer: Self-pay

## 2018-02-25 ENCOUNTER — Encounter (INDEPENDENT_AMBULATORY_CARE_PROVIDER_SITE_OTHER): Payer: Self-pay | Admitting: Family

## 2018-02-25 DIAGNOSIS — G4452 New daily persistent headache (NDPH): Secondary | ICD-10-CM

## 2018-02-25 NOTE — Telephone Encounter (Signed)
Neurology referral pended per patient request, routing to PCP to sign & complete if approved.

## 2018-05-16 ENCOUNTER — Other Ambulatory Visit: Payer: Self-pay

## 2018-05-18 ENCOUNTER — Encounter (HOSPITAL_BASED_OUTPATIENT_CLINIC_OR_DEPARTMENT_OTHER): Payer: Self-pay

## 2018-05-27 ENCOUNTER — Ambulatory Visit (INDEPENDENT_AMBULATORY_CARE_PROVIDER_SITE_OTHER): Payer: BLUE CROSS/BLUE SHIELD | Admitting: Otolaryngology

## 2018-05-27 ENCOUNTER — Ambulatory Visit (INDEPENDENT_AMBULATORY_CARE_PROVIDER_SITE_OTHER): Payer: BLUE CROSS/BLUE SHIELD

## 2018-05-27 VITALS — BP 152/90 | HR 85 | Ht 69.5 in

## 2018-05-27 DIAGNOSIS — Z6836 Body mass index (BMI) 36.0-36.9, adult: Secondary | ICD-10-CM

## 2018-05-27 DIAGNOSIS — R42 Dizziness and giddiness: Secondary | ICD-10-CM | POA: Insufficient documentation

## 2018-05-27 DIAGNOSIS — H9313 Tinnitus, bilateral: Secondary | ICD-10-CM

## 2018-05-27 DIAGNOSIS — H903 Sensorineural hearing loss, bilateral: Secondary | ICD-10-CM

## 2018-05-27 MED ORDER — DIAZEPAM 2 MG OR TABS
2.0000 mg | ORAL_TABLET | Freq: Four times a day (QID) | ORAL | 0 refills | Status: DC | PRN
Start: 2018-05-27 — End: 2019-09-22

## 2018-05-27 NOTE — Progress Notes (Signed)
Completed Audiogram, Tympanometry, Acoustic Reflexes and OtoAcoustic Emissions.    Refer to Dr. Langman for impressions.    Tabor Bartram J. Captain Blucher, OTO Tech

## 2018-05-27 NOTE — Progress Notes (Signed)
Anthony Andrade is a 52 year old male is a new patient seen today for dizziness.  His dizziness has been present for several years. Worse with certain movements.  Occasionally spinning.  Treated with steroids, vestibular therapy without benefit. He feels dizzy all the time.  He has some hearing loss and constant tinnitus. No sensation of eyeball movement. 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  The onset of his dizziness was at the time of the onset of his chronic migraine. A VNG in May 2017 was normal    REVIEW OF SYSTEMS:   Constitutional: Negative   Eyes: Negative   Nose, Mouth, Throat: see above  Cardiovascular: Positive for HTN.  Respiratory:OSA  Gastrointestinal: Negative  GU: Negative  Musculoskeletal: Negative   Skin: Negative   Neurological: Positive for chronic migraine.  Psychiatric: Negative   Endocrine: Positive for DM, hyperlipidemia.  Hematologic/Lymphatic: Negative  Allergic/Immunologic: Negative         Review of patient's allergies indicates:  Allergies   Allergen Reactions   . Morphine Nausea/Vomiting   . Penicillins Rash     Past Surgical History:   Procedure Laterality Date   . CHOLECYSTECTOMY     . COLONOSCOPY STOMA DX INCLUDING COLLJ SPEC SPX  2008    normal per patient   . ESOPHAGOGASTRODUODENOSCOPY TRANSORAL DIAGNOSTIC  2008    on prevacid   . UNLISTED PROCEDURE SHOULDER      left shoulder     Past Medical History:   Diagnosis Date   . Allergic rhinitis due to other allergen    . Allergic rhinitis, cause unspecified 01/19/2009   . Ankylosing spondylitis (HCC)    . Asthma 07/11/2013   . Bronchitis 09/08/2017   . Chronic pain 02/16/2012   . Depressive disorder, not elsewhere classified 12/16/2008   . Esophageal reflux    . Hypertension 02/16/2012   . Hypertriglyceridemia 04/06/2015   . Hypothyroidism 09/11/2012   . Restless legs syndrome (RLS)    . Sleep apnea 08/20/2010   . Unspecified sleep apnea      Social History     Socioeconomic History   . Marital status: Married    Spouse name: Not on file   . Number of children: Not on file   . Years of education: Not on file   . Highest education level: Not on file   Occupational History   . Not on file   Social Needs   . Financial resource strain: Not on file   . Food insecurity:     Worry: Not on file     Inability: Not on file   . Transportation needs:     Medical: Not on file     Non-medical: Not on file   Tobacco Use   . Smoking status: Former Games developer   . Smokeless tobacco: Never Used   Substance and Sexual Activity   . Alcohol use: No   . Drug use: No   . Sexual activity: Not on file   Lifestyle   . Physical activity:     Days per week: Not on file     Minutes per session: Not on file   . Stress: Not on file   Relationships   . Social connections:     Talks on phone: Not on file     Gets together: Not on file     Attends religious service: Not on file     Active member of club or organization: Not on file  Attends meetings of clubs or organizations: Not on file     Relationship status: Not on file   . Intimate partner violence:     Fear of current or ex partner: Not on file     Emotionally abused: Not on file     Physically abused: Not on file     Forced sexual activity: Not on file   Other Topics Concern   . Not on file   Social History Narrative    Recently moved from [place].  Works for [employer].  One daughter.  Sister is an Charity fundraiser.         02/15/12    Lives with his wife.    Daughter is studious     Son has been diagnosed with mild asperger's syndrome.    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  There is no dehiscence present.  MRI from 03/2018 reported normal    Physical Exam:  BP (!) 152/90   Pulse 85   Ht 5' 9.5" (1.765 m)   BMI 36.83 kg/m   Ears   Pinnae: No abnormalities   Canals: No abnormalities    [place]       Face Symmetry: No abnormalities    Ocular Mobility: No abnormalities  Otoscopic: TM's intact and mobile    Nose:   External: No abnormalities   Turbinates: No abnormalities   Mucosa: No abnormalities   Septum: No abnormalities    Nasopharynx: No abnormalities    Oral Cavity:   Lips: No abnormalities   Teeth: in good repair    Oropharynx:   Tonsils: No abnormalities    Hypopharynx: No abnormalities    Larynx:  True vocal cords normal     Neck:    Nodes: no adenopathy   Thyroid: normal to inspection and palpation   Salivary Glands: No abnormalities      Neuro:   Mental status: Alert and oriented x3   CN II-XII: No abnormalities   Romberg: No abnormalities   Fukuda: Not evaluated   Cerebellar: No abnormalities   Gait: Normal    PROCEDURE: No procedure performed    ASSESSMENT:  1. Disequilibrium  - diazePAM 2 MG tablet; Take 1 tablet (2 mg) by mouth every 6 hours as needed. For dizziness  Dispense: 40 tablet; Refill: 0    2. Sensorineural hearing loss, bilateral      PLAN:   His dizziness most likely is related to his chronic migraine.  He does not have bilateral SCC dehiscence.  I suggested a trial of a vestibular suppressant to see if this would help decrease his hypersensitivity state as regards his balance.  If valium is beneficial I asked Anthony Andrade to discuss with Dr. Nolon RodNago the use of clonazepam.    Anthony AltoAlan W Tashonda Pinkus, MD

## 2018-06-25 ENCOUNTER — Encounter (HOSPITAL_BASED_OUTPATIENT_CLINIC_OR_DEPARTMENT_OTHER): Payer: Self-pay

## 2018-07-08 ENCOUNTER — Encounter (INDEPENDENT_AMBULATORY_CARE_PROVIDER_SITE_OTHER): Payer: Self-pay

## 2018-07-17 ENCOUNTER — Encounter (HOSPITAL_BASED_OUTPATIENT_CLINIC_OR_DEPARTMENT_OTHER): Payer: Self-pay

## 2018-07-31 ENCOUNTER — Encounter (INDEPENDENT_AMBULATORY_CARE_PROVIDER_SITE_OTHER): Payer: Self-pay | Admitting: Family Medicine

## 2018-07-31 ENCOUNTER — Ambulatory Visit (INDEPENDENT_AMBULATORY_CARE_PROVIDER_SITE_OTHER): Payer: BLUE CROSS/BLUE SHIELD | Admitting: Family Medicine

## 2018-07-31 DIAGNOSIS — J4541 Moderate persistent asthma with (acute) exacerbation: Secondary | ICD-10-CM

## 2018-07-31 DIAGNOSIS — J209 Acute bronchitis, unspecified: Secondary | ICD-10-CM

## 2018-07-31 MED ORDER — AZITHROMYCIN 250 MG OR TABS
ORAL_TABLET | ORAL | 0 refills | Status: DC
Start: 2018-07-31 — End: 2019-01-06

## 2018-07-31 MED ORDER — ALBUTEROL SULFATE HFA 108 (90 BASE) MCG/ACT IN AERS
1.0000 | INHALATION_SPRAY | RESPIRATORY_TRACT | 0 refills | Status: DC | PRN
Start: 2018-07-31 — End: 2019-09-22

## 2018-07-31 MED ORDER — BENZONATATE 100 MG OR CAPS
100.0000 mg | ORAL_CAPSULE | Freq: Three times a day (TID) | ORAL | 0 refills | Status: DC | PRN
Start: 2018-07-31 — End: 2019-09-22

## 2018-07-31 MED ORDER — PREDNISONE 20 MG OR TABS
40.0000 mg | ORAL_TABLET | Freq: Every day | ORAL | 0 refills | Status: AC
Start: 2018-07-31 — End: 2018-08-05

## 2018-07-31 NOTE — Patient Instructions (Signed)
Patient Education     Acute Bronchitis  Your healthcare provider has told you that you have acute bronchitis. Bronchitis is infection or inflammation of the bronchial tubes (airways in the lungs). Normally, air moves easily in and out of the airways. Bronchitis narrows the airways, making it harder for air to flow in and out of the lungs. This causes symptoms such as shortness of breath, coughing up yellow or green mucus, and wheezing. Bronchitis can be acute or chronic. Acute means the condition comes on quickly and goes away in a short time, usually within 3 to 10 days. Chronic means a condition lasts a long time and often comes back.    What causes acute bronchitis?  Acute bronchitis almost always starts as a viral respiratory infection, such as a cold or the flu. Certain factors make it more likely for a cold or flu to turn into bronchitis. These include being very young, being elderly, having a heart or lung problem, or having a weak immune system. Cigarette smoking also makes bronchitis more likely.  When bronchitis develops, the airways become swollen. The airways may also become infected with bacteria. This is known as a secondary infection.  Diagnosing acute bronchitis  Your healthcare provider will examine you and ask about your symptoms and health history. You may also have a sputum culture to test the fluid in your lungs. Chest X-rays may be done to look for infection in the lungs.  Treating acute bronchitis  Bronchitis usually clears up as the cold or flu goes away. You can help feel better faster by doing the following:   Take medicine as directed. You may be told to take ibuprofen or other over-the-counter medicines. These help relieve inflammation in your bronchial tubes. Your healthcare provider may prescribe an inhaler to help open up the bronchial tubes. Most of the time,acute bronchitisis caused by a viral infection. Antibiotics are usually not prescribed for viral infections.   Drink plenty  of fluids, such as water, juice, or warm soup. Fluids loosen mucus so that you can cough it up. This helps you breathe more easily. Fluids also prevent dehydration.   Make sure you get plenty of rest.   Do not smoke. Do not allow anyone else to smoke in your home.  Recovery and follow-up  Follow up with your doctor as you are told. You will likely feel better in a week or two. But a dry cough can linger beyond that time. Let your doctor know if you still have symptoms (other than a dry cough) after 2 weeks, or if you're prone to getting bronchial infections. Take steps to protect yourself from future infections. These steps include stopping smoking and avoiding tobacco smoke, washing your hands often, and getting a yearly flu shot.  When to call your healthcare provider  Call the healthcare provider if you have any of the following:   Fever of 100.4F (38.0C) or higher, or as advised   Symptoms that get worse, or new symptoms   Trouble breathing   Symptoms that don't start to improve within a week, or within 3 days of taking antibiotics   Date Last Reviewed: 05/25/2015   2000-2018 [employer] [address], [place] [address]. All rights reserved. This information is not intended as a substitute for professional medical care. Always follow your healthcare professional's instructions.

## 2018-07-31 NOTE — Progress Notes (Signed)
Chief Complaint   Patient presents with   . URI     Cough, congestion x 3 weeks       SUBJECTIVE:  Anthony Andrade is an 53 year old male who presents with cough,sinus congestion, sob, wheezing x 3 weeks.  Other family member sick with similar symptoms  No known sick contacts.  Has been using mucin ex, allegra, albuterol, budesonide inhaler, tessalon Perles.    Last used albuterol and steroid inhaler 2-3 hrs ago with some relief of symptoms.        Outpatient Medications Prior to Visit   Medication Sig Dispense Refill   . Albuterol Sulfate HFA 108 (90 Base) MCG/ACT Inhalation Aero Soln Inhale 2 puffs by mouth every 4 hours as needed for shortness of breath/wheezing. 1 Inhaler 2   . AmLODIPine Besylate 10 MG Oral Tab One tablet every am 30 tablet 11   . ASPIRIN 81 OR      . Atorvastatin Calcium 20 MG Oral Tab Take 1 tablet (20 mg) by mouth daily. To lower cholesterol 90 tablet 0   . Azithromycin (ZITHROMAX) 250 MG Oral Tab Take 2 tablets today, then take 1 tablet every day until gone. (Patient not taking: Reported on 09/08/2017) 6 tablet 0   . Benzonatate 200 MG Oral Cap Take 1 capsule (200 mg) by mouth 3 times a day as needed for cough. 30 capsule 0   . Blood Glucose Monitoring Suppl Kit Use to check blood sugar once daily 1 kit 0   . Budesonide-Formoterol Fumarate 80-4.5 MCG/ACT Inhalation Aerosol Inhale 2 puffs by mouth every 12 hours. 1 Inhaler 3   . Certolizumab Pegol (CIMZIA PREFILLED) 2 X 200 MG/ML Subcutaneous Kit Inject 200 mg under the skin every 2 weeks.     . Clotrimazole-Betamethasone 1-0.05 % External Cream Apply 1 application topically 2 times a day as needed (rash). Apply to buttocks 45 g 6   . diazePAM 2 MG tablet Take 1 tablet (2 mg) by mouth every 6 hours as needed. For dizziness 40 tablet 0   . Erenumab-aooe 70 MG/ML Subcutaneous Solution Auto-injector Inject 140 mg under the skin every 4 weeks.     . Fluticasone-Salmeterol 250-50 MCG/DOSE Inhalation AEROSOL POWDER, BREATH ACTIVATED Inhale 1  puff by mouth 2 times a day. 14 disk 2   . Glucose Blood (BLOOD GLUCOSE TEST) In Vitro Strip Use 1 strip daily. Use to check blood sugar. 100 strip 1   . Ipratropium-Albuterol 20-100 MCG/ACT Inhalation Aero Soln Inhale 2 puffs by mouth every 4 hours as needed (cough). 1 Inhaler 11   . Lancets Thin Misc Use 1 each daily. 100 each 1   . Levothyroxine Sodium 88 MCG Oral Tab Take 1 tablet (88 mcg) by mouth daily on an empty stomach. 90 tablet 1   . Losartan Potassium 50 MG Oral Tab take 1 tablet by mouth twice a day 60 tablet 5   . Meloxicam 15 MG Oral Tab Take 1 tablet (15 mg) by mouth daily. 90 tablet 2   . MetFORMIN HCl ER 750 MG Oral TABLET SR 24 HR Take 1 tablet (750 mg) by mouth daily. 90 tablet 0   . Montelukast Sodium 10 MG Oral Tab Take 1 tablet (10 mg) by mouth every evening. 90 tablet 3   . Olopatadine HCl 0.6 % Nasal Solution Spray 2 sprays into each nostril 2 times a day. 1 bottle 6   . Ondansetron 8 MG Oral TABLET DISPERSIBLE dissolve 1 tablet ON TONGUE AND  SWALLOW every 8 hours if needed for nausea and vomiting 60 tablet 0   . Pregabalin 225 MG Oral Cap One tablet at dinner and one tablet at bedtime 60 capsule 3   . Respiratory Therapy Supplies (NEBULIZER) Device Use with albuterol as needed every 4 hours. NEEDS PORTABLE with battery 1 each 0   . Spironolactone 25 MG Oral Tab Take 1 tablet (25 mg) by mouth daily. 1 tablet 0     No facility-administered medications prior to visit.        Review of patient's allergies indicates:  Allergies   Allergen Reactions   . Morphine Nausea/Vomiting   . Penicillins Rash       Social History     Tobacco Use   . Smoking status: Former Research scientist (life sciences)   . Smokeless tobacco: Never Used   Substance Use Topics   . Alcohol use: No       I personally reviewed and confirmed the ROS, past medical history, past surgical history, family history and social history in the record with the patient today.      Review of Systems   Constitutional: Negative for chills and fever.   HENT: Positive  for congestion, postnasal drip, rhinorrhea and sinus pressure. Negative for ear discharge, ear pain, sinus pain and sore throat.    Respiratory: Positive for cough, shortness of breath and wheezing.    Gastrointestinal: Negative for diarrhea, nausea and vomiting.   Neurological: Negative for headaches.       OBJECTIVE:  BP (P) 113/67   Pulse (P) 90   Temp (P) 98.4 F (36.9 C) (Temporal)   Resp (P) 15   Ht (P) 5' 9.5" (1.765 m)   SpO2 (P) 98%   BMI (P) 36.83 kg/m   Physical Exam   Constitutional: He is oriented to person, place, and time. He appears well-developed and well-nourished. No distress.   HENT:   Head: Normocephalic and atraumatic.   Right Ear: Tympanic membrane, external ear and ear canal normal.   Left Ear: Tympanic membrane, external ear and ear canal normal.   Nose: Mucosal edema and rhinorrhea present. Right sinus exhibits no maxillary sinus tenderness and no frontal sinus tenderness. Left sinus exhibits no maxillary sinus tenderness and no frontal sinus tenderness.   Mouth/Throat: Oropharynx is clear and moist and mucous membranes are normal.   Neck: Normal range of motion. Neck supple.   Cardiovascular: Normal rate, regular rhythm and normal heart sounds.   No murmur heard.  Pulmonary/Chest: Effort normal and breath sounds normal. No respiratory distress. He has no wheezes. He has no rales.   Neurological: He is alert and oriented to person, place, and time.   Skin: He is not diaphoretic.   Nursing note and vitals reviewed.      ASSESSMENT/PLAN  (J45.41) Moderate persistent asthmatic bronchitis with acute exacerbation  (primary encounter diagnosis)  Plan: albuterol HFA 108 (90 Base) MCG/ACT inhaler,         benzonatate (Tessalon Perles) 100 MG capsule,         predniSONE 20 MG tablet    (J20.9) Acute bronchitis with symptoms > 10 days  Plan: azithromycin (Zithromax) 250 MG tablet  1.  Increase fluids, rest, nasal saline  2.  F/u with primary care in 1-2 weeks, if symptoms don't improve  If  symptoms worsen RTC or go to the ER for further evaluation and treatment.  3.  Zpack prednisone, refilled albuterol MDI, Tessalon Perles      Discussed medications in detail  including dosing, side effects, and interactions.    Fulton Mole, MD  [place]

## 2018-08-12 ENCOUNTER — Encounter (INDEPENDENT_AMBULATORY_CARE_PROVIDER_SITE_OTHER): Payer: Self-pay

## 2018-11-11 ENCOUNTER — Telehealth (INDEPENDENT_AMBULATORY_CARE_PROVIDER_SITE_OTHER): Payer: Self-pay | Admitting: Family

## 2018-12-09 ENCOUNTER — Telehealth (INDEPENDENT_AMBULATORY_CARE_PROVIDER_SITE_OTHER): Payer: Self-pay | Admitting: Family

## 2018-12-09 NOTE — Telephone Encounter (Signed)
Panel Management Meeting Notes    Provider: Rocky Crafts, [place]   Attendees: Marvis Repress, [facility]    Care Gaps Reviewed:    1. Eye exam  2. Foot exam    Action(s):    1. Patient sees endo [facility] and appears to have stable A1c. PCP to look into adjusting frequency of health maintenance due for A1c.       Marland Kitchen Responsible Role: PCP

## 2018-12-29 ENCOUNTER — Encounter (INDEPENDENT_AMBULATORY_CARE_PROVIDER_SITE_OTHER): Payer: Self-pay | Admitting: Family

## 2018-12-30 NOTE — Telephone Encounter (Signed)
Routing to the front desk to assist with scheduling.

## 2018-12-31 NOTE — Telephone Encounter (Signed)
Pt is scheduled with PCP  Closing TE

## 2019-01-05 ENCOUNTER — Telehealth (INDEPENDENT_AMBULATORY_CARE_PROVIDER_SITE_OTHER): Payer: BLUE CROSS/BLUE SHIELD | Admitting: Family

## 2019-01-06 ENCOUNTER — Encounter (INDEPENDENT_AMBULATORY_CARE_PROVIDER_SITE_OTHER): Payer: Self-pay | Admitting: Family

## 2019-01-06 ENCOUNTER — Ambulatory Visit (INDEPENDENT_AMBULATORY_CARE_PROVIDER_SITE_OTHER): Payer: Self-pay

## 2019-01-06 ENCOUNTER — Telehealth (INDEPENDENT_AMBULATORY_CARE_PROVIDER_SITE_OTHER): Payer: BLUE CROSS/BLUE SHIELD | Admitting: Family

## 2019-01-06 DIAGNOSIS — S92352D Displaced fracture of fifth metatarsal bone, left foot, subsequent encounter for fracture with routine healing: Secondary | ICD-10-CM

## 2019-01-06 NOTE — Progress Notes (Signed)
Telemedicine Consent   You have chosen to receive care through the use of telemedicine. Telemedicine enables health care providers at different locations to provide safe, effective, and convenient care through the use of technology. As with any health care service, there are risks associated with the use of telemedicine, including equipment failure, poor image resolution, and information security issues.     Do you understand the risks and benefits of telemedicine as I have explained them to you? YES  Have your questions regarding telemedicine been answered? YES  Do you consent to the use of telemedicine in your medical care today? YES    This encounter was conducted from [facility] via secure, live, face-to-face Zoom video conference with the patient who is at home located in [place].  Patient is accompanied by: wife    Call Back Phone Number: snapshot     Anthony Andrade participating in Telemedicine to discuss the following chief complaint(s) Fx left foot on December 27, 2018    HPI:Pt was seen in urgent care 7/5. He was fixing shower and came down wrong on left foot. Went to urgent care and told he had fx. He was instructed to come in 2-3 days after but waited.    -walking with boot at least 10 hrs.  -rated pain 6-7   -still swollen and bruising is spreading across several toes.  -no prior fx to this foot.    Review of Systems  Except for what is documented in HPI and ROS, the complete ROS is negative.  The following portions of the patient's history were reviewed with the patient and updated as appropriate: problem list, current medications, allergies, past medical history, past surgical history, past social history and past family history.  Reviewed overdue health maintenance topics with patient.    Physical Exam  No vital signs taken  Foot is bruised and cross 5, 4, 3 digits from proximal to distal regions.   Edema noted along the later and mid area of foot. No redness.  No deformity.    Reviewed the xray  [facility]:  FINDINGS: Acute, minimally displaced fracture is present through the  proximal diametaphysis of the fifth metatarsal. Joint spaces are  preserved. No focal lytic or sclerotic bone lesions.    MEDICAL DECISION MAKING:  # of diagnoses or management options: limited (b)  Amount and/or complexity of data to be reviewed: moderate (c)  Risk of complications and/or morbidity or mortality: low (b)      IMPRESSION and PLAN:  Anthony Andrade presents with:fx foot.  Possibly Jones fracture and could require follow up with non weight bearing and casting.       -possible non weight bearing  -xray tomorrow  I spent a total time of 15 minutes using telemedicine with the patient, of which more than 50% was spent counseling and coordinating care as outlined in this note.    Pt understands the above plan of care with all questions answered to their satisfaction. Home instructions discussed.  AVS provided either by ecare or printed.  Discussed medication dosage, usage, side effects, & therapy goals. Patient understands & is agreeable to the plan of care. Patient encouraged to read the handouts from pharmacy regarding medications.   Dictation with [employer] and may include misspellings

## 2019-01-06 NOTE — Progress Notes (Signed)
Anthony Andrade participating in Telemedicine to discuss the following chief complaint(s) Fx ft December 27, 2018    HPI:    -walking with boot at least 10 hrs.  -rated pain 6-7   -still swollen  -bruising has reduced     Review of Systems  Except for what is documented in HPI and ROS, the complete ROS is negative.  The following portions of the patient's history were reviewed with the patient and updated as appropriate: problem list, current medications, allergies, past medical history, past surgical history, past social history and past family history.  Reviewed overdue health maintenance topics with patient.    Physical Exam        [facility]:  FINDINGS: Acute, minimally displaced fracture is present through the  proximal diametaphysis of the fifth metatarsal. Joint spaces are  preserved. No focal lytic or sclerotic bone lesions.  MEDICAL DECISION MAKING:  # of diagnoses or management options: limited (b)  Amount and/or complexity of data to be reviewed: moderate (c)  Risk of complications and/or morbidity or mortality: low (b)      IMPRESSION and PLAN:  Anthony Andrade presents with:  1. Left minimally displaced proximal fifth metatarsal fracture    - Continue to wear boot throughout the day  - Wear compression stockings during day and elevate at night to reduce swelling   - Remain non-weightbearing as much as possible  - Ice foot for 82min BID per day  - Continue to take 1000mg  tylenol q8hr, ibuprofen 600mg  q4-6hr for pain  - Repeat x-ray tomorrow    - Follow up with Ortho         Pt understands the above plan of care with all questions answered to their satisfaction. Home instructions discussed.  AVS provided either by ecare or printed.  Discussed medication dosage, usage, side effects, & therapy goals. Patient understands & is agreeable to the plan of care. Patient encouraged to read the handouts from pharmacy regarding medications.

## 2019-01-07 ENCOUNTER — Other Ambulatory Visit (INDEPENDENT_AMBULATORY_CARE_PROVIDER_SITE_OTHER): Payer: BLUE CROSS/BLUE SHIELD

## 2019-01-07 ENCOUNTER — Telehealth (INDEPENDENT_AMBULATORY_CARE_PROVIDER_SITE_OTHER): Payer: Self-pay | Admitting: Family

## 2019-01-07 ENCOUNTER — Ambulatory Visit (INDEPENDENT_AMBULATORY_CARE_PROVIDER_SITE_OTHER): Payer: BLUE CROSS/BLUE SHIELD

## 2019-01-07 DIAGNOSIS — S92352D Displaced fracture of fifth metatarsal bone, left foot, subsequent encounter for fracture with routine healing: Secondary | ICD-10-CM

## 2019-01-07 NOTE — Telephone Encounter (Signed)
Levada Dy or covering provider please reverse orders from yesterday or add additional foot xray orders as patient never showed for his xray visit and it wasn't cancelled and the xray scheduled by Levada Dy was scheduled incorrect.  Please advise.

## 2019-01-07 NOTE — Telephone Encounter (Signed)
Orders placed. Can be scheduled.

## 2019-01-09 ENCOUNTER — Encounter (INDEPENDENT_AMBULATORY_CARE_PROVIDER_SITE_OTHER): Payer: Self-pay | Admitting: Family

## 2019-01-09 DIAGNOSIS — S99192A Other physeal fracture of left metatarsal, initial encounter for closed fracture: Secondary | ICD-10-CM

## 2019-01-09 NOTE — Telephone Encounter (Signed)
Patient was advise of Jone''s fracture and suggested non weight bearing.    Patient referred to [facility]

## 2019-01-10 DIAGNOSIS — S92352D Displaced fracture of fifth metatarsal bone, left foot, subsequent encounter for fracture with routine healing: Secondary | ICD-10-CM | POA: Insufficient documentation

## 2019-01-18 ENCOUNTER — Other Ambulatory Visit (INDEPENDENT_AMBULATORY_CARE_PROVIDER_SITE_OTHER): Payer: Self-pay | Admitting: Family

## 2019-02-23 ENCOUNTER — Encounter (INDEPENDENT_AMBULATORY_CARE_PROVIDER_SITE_OTHER): Payer: Self-pay

## 2019-04-11 ENCOUNTER — Other Ambulatory Visit: Payer: Self-pay

## 2019-08-16 ENCOUNTER — Encounter (INDEPENDENT_AMBULATORY_CARE_PROVIDER_SITE_OTHER): Payer: Self-pay

## 2019-09-21 ENCOUNTER — Encounter (INDEPENDENT_AMBULATORY_CARE_PROVIDER_SITE_OTHER): Payer: Self-pay

## 2019-09-22 ENCOUNTER — Encounter (INDEPENDENT_AMBULATORY_CARE_PROVIDER_SITE_OTHER): Payer: Self-pay | Admitting: Family

## 2019-09-22 ENCOUNTER — Telehealth (INDEPENDENT_AMBULATORY_CARE_PROVIDER_SITE_OTHER): Payer: BLUE CROSS/BLUE SHIELD | Admitting: Family

## 2019-09-22 DIAGNOSIS — E039 Hypothyroidism, unspecified: Secondary | ICD-10-CM

## 2019-09-22 DIAGNOSIS — J301 Allergic rhinitis due to pollen: Secondary | ICD-10-CM

## 2019-09-22 DIAGNOSIS — J4532 Mild persistent asthma with status asthmaticus: Secondary | ICD-10-CM

## 2019-09-22 DIAGNOSIS — R21 Rash and other nonspecific skin eruption: Secondary | ICD-10-CM

## 2019-09-22 DIAGNOSIS — J4541 Moderate persistent asthma with (acute) exacerbation: Secondary | ICD-10-CM

## 2019-09-22 DIAGNOSIS — I10 Essential (primary) hypertension: Secondary | ICD-10-CM

## 2019-09-22 MED ORDER — CLOTRIMAZOLE-BETAMETHASONE 1-0.05 % EX CREA
1.0000 | TOPICAL_CREAM | Freq: Two times a day (BID) | CUTANEOUS | 6 refills | Status: AC | PRN
Start: 2019-09-22 — End: ?

## 2019-09-22 MED ORDER — AZELASTINE HCL 0.1 % NA SOLN
1.0000 | Freq: Two times a day (BID) | NASAL | 6 refills | Status: DC
Start: 2019-09-22 — End: 2023-10-28

## 2019-09-22 MED ORDER — FLUTICASONE-SALMETEROL 250-50 MCG/ACT IN AEPB
1.0000 | INHALATION_SPRAY | Freq: Two times a day (BID) | RESPIRATORY_TRACT | 3 refills | Status: DC
Start: 2019-09-22 — End: 2023-07-23

## 2019-09-22 MED ORDER — ALBUTEROL SULFATE HFA 108 (90 BASE) MCG/ACT IN AERS
1.0000 | INHALATION_SPRAY | RESPIRATORY_TRACT | 1 refills | Status: DC | PRN
Start: 2019-09-22 — End: 2023-07-23

## 2019-09-22 NOTE — Progress Notes (Addendum)
Telemedicine Consent   You have chosen to receive care through the use of telemedicine. Telemedicine enables health care providers at different locations to provide safe, effective, and convenient care through the use of technology. As with any health care service, there are risks associated with the use of telemedicine, including equipment failure, poor image resolution, and information security issues.     Do you understand the risks and benefits of telemedicine as I have explained them to you? YES  Have your questions regarding telemedicine been answered? YES  Do you consent to the use of telemedicine in your medical care today? YES    This encounter was conducted from [facility] via secure, live, face-to-face Zoom video conference with the patient who is at home located in [place], [place].   Patient is accompanied by: self     Call Back Phone Number: snapshot     Anthony Andrade participating in Telemedicine to discuss the following chief complaint(s): Rhinitis due to pollen allergies/. Just needs refills of current medications. Does not need changes. Doing well with current plan.    HPI of the above condition:   location: [place]  duration: lifelong alleriges  quality: sneezing, nasal congestion, reactive airway  severity: moderate  timing: trees, grasses  context: long hx of allergies to pollen. Has a good formula now that works for him  modifying factors: medication helps, showers  associated signs and symptoms: mild nasal congestion now.  Also takes allegra.    Review of Systems  Except for what is documented in HPI and ROS, the complete ROS is negative.  [facility]   Seeing ortho for recent injury    The following portions of the patient's history were reviewed with the patient and updated as appropriate: problem list, current medications, allergies, past medical history, past surgical history, past social history and  past family history.  Reviewed overdue health maintenance topics with patient.    Physical Exam  Vital Signs were not obtained today.  Patient appears healthy and in no distress.   Mild rhinitis noted.  Good eye contact. Mild raccoon eyes bilaterally.   Smiling.   Appears well rested.   No cough or wheezing heard.    MEDICAL DECISION MAKING:  # of diagnoses or management options: limited (b)  Amount and/or complexity of data to be reviewed: moderate (c)  Risk of complications and/or morbidity or mortality: low (b)      IMPRESSION and PLAN:  [name] presents with:    (J30.1) Seasonal allergic rhinitis due to pollen  (primary encounter diagnosis)  Plan: azelastine 0.1 % nasal spray    (J45.32) Mild persistent asthma with status asthmaticus  Plan: fluticasone-salmeterol 250-50 MCG/DOSE diskus         inhaler    (J45.41) Moderate persistent asthmatic bronchitis with acute exacerbation  Plan: albuterol HFA 108 (90 Base) MCG/ACT inhaler    (R21) Rash  Plan: clotrimazole-betamethasone 1-0.05 % cream        (I10) Essential hypertension  Plan: amLODIPine 5 MG tablet  Updated his chart        Pt understands the above plan of care with all questions answered to their satisfaction. Home instructions discussed.  AVS provided either by ecare or printed.  Discussed medication dosage, usage, side effects, & therapy goals. Patient understands & is agreeable to the plan of care. Patient encouraged to read the handouts from pharmacy regarding medications.  Dictation with [employer] and may include misspellings

## 2020-04-12 ENCOUNTER — Other Ambulatory Visit: Payer: Self-pay

## 2020-10-27 ENCOUNTER — Telehealth (INDEPENDENT_AMBULATORY_CARE_PROVIDER_SITE_OTHER): Payer: Self-pay

## 2020-10-27 NOTE — Telephone Encounter (Signed)
Panel Management Meeting Notes    Provider: Babette Relic, ARNP  Attendees:  Babette Relic, ARNP , Johnnette Litter - PN,   RN present: No.  SW present: Yes - Laurelyn Sickle, MSW.  Additional attendees:      Care Gaps Reviewed:    Health Maintenance Due   Topic   . Hepatitis B Vaccine (1 of 3 - Risk 3-dose series)   . Diabetes Foot Exam    . Diabetes Eye Exam    . Depression Monitoring (PHQ-9)    . Diabetes A1c    . COVID-19 Vaccine (3 - Booster for Moderna series)         Action(s):    1. Pt discussed in panel meeting  2. Per PCP, pt is due for Diabetic eye exam, foot exam and A1C  3. Routing for outreach to schedule appt

## 2020-10-30 NOTE — Telephone Encounter (Signed)
Pt goes to Endocrinology and has an upcoming f/u appointment in June so deferring from calling or contacting pt for diabetes F/u at our clinic.   Closing enc

## 2020-11-03 ENCOUNTER — Encounter (INDEPENDENT_AMBULATORY_CARE_PROVIDER_SITE_OTHER): Payer: Self-pay

## 2021-04-24 ENCOUNTER — Other Ambulatory Visit: Payer: Self-pay

## 2021-06-13 ENCOUNTER — Ambulatory Visit (INDEPENDENT_AMBULATORY_CARE_PROVIDER_SITE_OTHER): Payer: BLUE CROSS/BLUE SHIELD | Admitting: Family

## 2021-06-13 DIAGNOSIS — Z125 Encounter for screening for malignant neoplasm of prostate: Secondary | ICD-10-CM

## 2021-06-13 DIAGNOSIS — Z130 Encounter for screening for diseases of the blood and blood-forming organs and certain disorders involving the immune mechanism: Secondary | ICD-10-CM

## 2021-06-13 DIAGNOSIS — N390 Urinary tract infection, site not specified: Secondary | ICD-10-CM

## 2021-06-13 DIAGNOSIS — R319 Hematuria, unspecified: Secondary | ICD-10-CM

## 2021-06-13 LAB — PSA, TOTAL & REFLEXIVE FREE: PSA, Diagnostic/Monitoring: 0.89 ng/mL (ref 0.00–4.00)

## 2021-06-13 LAB — CBC, DIFF
% Basophils: 1 %
% Eosinophils: 3 %
% Immature Granulocytes: 0 %
% Lymphocytes: 24 %
% Monocytes: 8 %
% Neutrophils: 64 %
% Nucleated RBC: 0 %
Absolute Eosinophil Count: 0.18 10*3/uL (ref 0.00–0.50)
Absolute Lymphocyte Count: 1.72 10*3/uL (ref 1.00–4.80)
Basophils: 0.05 10*3/uL (ref 0.00–0.20)
Hematocrit: 43 % (ref 38.0–50.0)
Hemoglobin: 15 g/dL (ref 13.0–18.0)
Immature Granulocytes: 0.01 10*3/uL (ref 0.00–0.05)
MCH: 31.5 pg (ref 27.3–33.6)
MCHC: 34.7 g/dL (ref 32.2–36.5)
MCV: 91 fL (ref 81–98)
Monocytes: 0.55 10*3/uL (ref 0.00–0.80)
Neutrophils: 4.61 10*3/uL (ref 1.80–7.00)
Nucleated RBC: 0 10*3/uL
Platelet Count: 191 10*3/uL (ref 150–400)
RBC: 4.76 10*6/uL (ref 4.40–5.60)
RDW-CV: 12.2 % (ref 11.0–14.5)
WBC: 7.12 10*3/uL (ref 4.3–10.0)

## 2021-06-13 LAB — URINALYSIS COMPLETE, URN
Bacteria, URN: NONE SEEN
Bilirubin (Qual), URN: NEGATIVE
Epith Cells_Renal/Trans,URN: NEGATIVE /HPF
Epith Cells_Squamous, URN: NEGATIVE /LPF
Ketones, URN: NEGATIVE
Leukocyte Esterase, URN: NEGATIVE
Nitrite, URN: NEGATIVE
Occult Blood, URN: NEGATIVE
Protein (Alb Semiquant), URN: NEGATIVE
RBC, URN: NEGATIVE /HPF
Specific Gravity, URN: 1.017 g/mL (ref 1.006–1.027)
WBC, URN: NEGATIVE /HPF
pH, URN: 6 (ref 5.0–8.0)

## 2021-06-13 LAB — PROTHROMBIN TIME, ONSITE: prothrombin INR: 1

## 2021-06-13 MED ORDER — SULFAMETHOXAZOLE-TRIMETHOPRIM 800-160 MG OR TABS
1.0000 | ORAL_TABLET | Freq: Two times a day (BID) | ORAL | 0 refills | Status: AC
Start: 2021-06-13 — End: 2021-06-18

## 2021-06-14 ENCOUNTER — Encounter (INDEPENDENT_AMBULATORY_CARE_PROVIDER_SITE_OTHER): Payer: Self-pay | Admitting: Family

## 2021-06-14 DIAGNOSIS — R319 Hematuria, unspecified: Secondary | ICD-10-CM

## 2021-06-15 LAB — URINE C/S: Culture: NO GROWTH

## 2021-06-16 ENCOUNTER — Encounter (INDEPENDENT_AMBULATORY_CARE_PROVIDER_SITE_OTHER): Payer: Self-pay | Admitting: Family

## 2021-06-17 ENCOUNTER — Encounter (INDEPENDENT_AMBULATORY_CARE_PROVIDER_SITE_OTHER): Payer: Self-pay | Admitting: Family

## 2021-06-17 DIAGNOSIS — R319 Hematuria, unspecified: Secondary | ICD-10-CM | POA: Insufficient documentation

## 2021-06-17 DIAGNOSIS — R310 Gross hematuria: Secondary | ICD-10-CM

## 2021-06-17 NOTE — Progress Notes (Signed)
[facility] OUTPATIENT VISIT       Anthony Andrade is scheduled today to discuss the following chief complaint(s)    HPI: one day of blood in urine. Gross. Has picture. No dysuria. Some urgent feeling but not really uncomfortable. Denies risk of STD. No hx prostatitis. Not sexually active at this time. Denies other symptoms including no abdominal or flank pain, no nausea.     BPP:HKFEXM for what is documented in HPI and ROS, the complete ROS is negative.    The following portions of the patient's history were reviewed with the patient and updated as appropriate: problem list, current medications, allergies, past medical history, past surgical history, past social history and past family history.  Reviewed overdue health maintenance topics with patient.    OBJECTIVE:  BP (!) (P) 147/87   Pulse (!) (P) 103   Temp (P) 36.9 C (Temporal)   Resp (!) (P) 22   Wt (!) (P) 110.2 kg (243 lb)   SpO2 (P) 98%   BMI (P) 35.37 kg/m   Pt is well groomed and dressed, cooperative, no distress,  Vitals stable.   Abdomen: no masses, no hernias. Bowels sounds equal all quads. Non tender with palpation in all quads. No hernia masses, femoral pulses present. No flank tenderness.    IMPRESSION and PLAN:  Anthony Andrade presents with:  (N39.0,  R31.9) Urinary tract infection with hematuria, site unspecified  (primary encounter diagnosis)  Plan: Urinalysis Complete, Urine, Culture Urine,         Bact, PSA, Total & Reflexive Free, POC         Prothrombin Time, sulfamethoxazole-trimethoprim        800-160 MG tablet    (Z12.5) Screening for malignant neoplasm of prostate  Plan: PSA, Total & Reflexive Free    (Z13.0) Screening, anemia, deficiency, iron  Plan: CBC with Diff    (R31.9) Hematuria, unspecified type  Plan: unsure of the cause but concerned about infection, prostatitis vs renal concern.    Pt understands the above plan of care with all questions answered to their satisfaction. Home instructions  discussed.  AVS provided either by ecare or printed.    Discussed any prescribed medication dosage, usage, side effects, & therapy goals. Patient understands & is agreeable to the plan of care. Patient encouraged to read the handouts from pharmacy regarding medications.   Dictation with Animal nutritionist and may include misspellings            --  Anthony Mu, PhD, ARNP  Family Medicine  [facility]  Phone #[phone]  Fax # [phone]

## 2021-06-22 ENCOUNTER — Encounter (INDEPENDENT_AMBULATORY_CARE_PROVIDER_SITE_OTHER): Payer: Self-pay | Admitting: Family

## 2021-06-22 ENCOUNTER — Other Ambulatory Visit (INDEPENDENT_AMBULATORY_CARE_PROVIDER_SITE_OTHER): Payer: Self-pay | Admitting: Family

## 2021-06-22 DIAGNOSIS — R310 Gross hematuria: Secondary | ICD-10-CM

## 2021-06-28 ENCOUNTER — Other Ambulatory Visit (HOSPITAL_BASED_OUTPATIENT_CLINIC_OR_DEPARTMENT_OTHER): Payer: BLUE CROSS/BLUE SHIELD | Admitting: Urology

## 2021-06-28 DIAGNOSIS — R310 Gross hematuria: Secondary | ICD-10-CM

## 2021-06-28 NOTE — Progress Notes (Signed)
Reason for eConsult:  Anthony Andrade, Anthony Andrade, ARNP submitted the following request:  NOTE: Please consider the following steps in advance of ordering an eConsult.  1. UA and urine culture to rule out infection.If UTI, treat and recheck UA in 4-6 weeks for clearing of blood.   2. Confirm a history of gross hematuria by patient report or positive UA with microscopy x2 with >3 rbc/hpf on micro (or whatever the normal range is for the lab), in the absence of UTI.  3. If no UTI and creatinine normal, then order CT-IVP. (Check radiology guidelines regarding contrast.Screen for contrast allergy and metformin use.)  4. If no UTI and creatinine is elevated, order renal ultrasound.  NOTE: Imaging studies must be within the past 6 months.     I am requesting an eConsult for this 56 year old male with hematuria.      My clinical question is:My question is: does he need a cystoscopic exam now or only if the bleeding reoccurs.   Anthony Andrade experienced an acute episode of significant hematuria for approximately 12 hours. By the time he arrived at the clinic, the bleeding stopped. Anthony Andrade did not experience other symptoms and is at low risk for STD. Lab work available for review. No hx of renal stones but does have uncontrolled diabetes.     The following studies are available in [facility], [facility], or [facility]:   - UA, Urine culture C/S, Chem 7   Lab Results       Component                Value               Date                       COLORU                   Yellow              06/13/2021                 CLARITYU                 Clear               06/13/2021                 SPGRAVU                  1.017               06/13/2021                 PHU                      6.0                 06/13/2021                 PROTEINU                 Negative            06/13/2021                 GLUCOSEU                 2+                  06/13/2021  KETONEU                  Negative            06/13/2021                 BILIRUBINU                Negative            06/13/2021                 OCCLTBLDU                Negative            06/13/2021                 NITRITESU                Negative            06/13/2021                 LEUKESTRASEU             Negative            06/13/2021                 UROBILINGNU              0.1-1.9             06/13/2021                 MACROCOMU                None                06/13/2021            Lab Results       Component                Value               Date                       SPEC                     Stool               05/30/2009                 SPEC                     Stool               05/30/2009                 SPECLREQ                 None                06/13/2021                 CULTURE                                      06/13/2021             No growth (<1000 Col/mL)       RPRTSTATUS  05/30/2009             Final 06/02/2009       RPRTSTATUS                                   05/30/2009             Final 06/02/2009 Lab Results       Component                Value               Date                       NA                       138                 04/08/2011                 CL                       100                 01/01/2016                 CO2                      28                  01/01/2016                 BUN                      17                  01/01/2016                 CREATININE               1.01                01/01/2016                 CA                       9.6                 01/01/2016                 GFRA                     >60                 01/01/2016                 GFRB                     >60                 01/01/2016            The following reports are available: no images  If this clinical question is deemed too complex for eConsult, please  schedule this patient for in-person consultation. This patient understands  that they may receive a phone call from the specialty practice to schedule an appointment.      Babette RelicKerry E Meyer, PhD, ARNP  06/25/2021  _____________________________________________________________________  After careful review of the patient's results above and the patient's information available in the medical record,  the following are my findings and recommendations:  Anthony Andrade C Anthony Cake, MD  06/28/2021    1. Restatement of the question: What to do about gross hematuria.    2. Recommendation(s): Please order a CT IVP, and when completed, send referral for cystoscopy. Both studies are needed for evaluation of hematuria.     3. Rationale and/or evidence for recommendation:  Red beet consumption can also result in red colored urine.     4. Contingency plan: n/a    I spent a total time of 8 minutes reviewing and communicating the above findings and recommendations.      "This eConsult is based solely on the clinical information available to me in the patient's medical record and is provided without benefit of a comprehensive evaluation or physical examination of the patient. The information contained in this eConsult must be interpreted in light of any clinical issues or changes in patient status that were not known to me at the time this eConsult was completed. You must rely on your own informed clinical judgment for decision making. If necessary, we can schedule the patient for an in-office consultation."

## 2021-07-02 ENCOUNTER — Encounter (INDEPENDENT_AMBULATORY_CARE_PROVIDER_SITE_OTHER): Payer: Self-pay | Admitting: Family

## 2021-07-02 ENCOUNTER — Other Ambulatory Visit (HOSPITAL_COMMUNITY): Payer: Self-pay | Admitting: Family

## 2021-07-02 DIAGNOSIS — R310 Gross hematuria: Secondary | ICD-10-CM

## 2021-07-02 NOTE — Telephone Encounter (Signed)
NOTE: Please consider the following steps in advance of ordering an eConsult.  1. UA and urine culture to rule out infection.If UTI, treat and recheck UA in 4-6 weeks for clearing of blood.   2. Confirm a history of gross hematuria by patient report or positive UA with microscopy x2 with >3 rbc/hpf on micro (or whatever the normal range is for the lab), in the absence of UTI.  3. If no UTI and creatinine normal, then order CT-IVP. (Check radiology guidelines regarding contrast.Screen for contrast allergy and metformin use.)  4. If no UTI and creatinine is elevated, order renal ultrasound.

## 2021-07-03 ENCOUNTER — Encounter (INDEPENDENT_AMBULATORY_CARE_PROVIDER_SITE_OTHER): Payer: Self-pay

## 2021-07-11 ENCOUNTER — Ambulatory Visit
Admit: 2021-07-11 | Discharge: 2021-07-11 | Disposition: A | Discharge status: Home / Self Care | Payer: BLUE CROSS/BLUE SHIELD | Attending: Family | Admitting: Family

## 2021-07-11 ENCOUNTER — Other Ambulatory Visit (INDEPENDENT_AMBULATORY_CARE_PROVIDER_SITE_OTHER): Payer: Self-pay | Admitting: Family

## 2021-07-11 DIAGNOSIS — R310 Gross hematuria: Secondary | ICD-10-CM

## 2021-07-12 LAB — RENAL/HEPATIC FUNCTION PANEL
ALT (GPT): 75 U/L — ABNORMAL HIGH (ref 10–48)
AST (GOT): 55 U/L — ABNORMAL HIGH (ref 9–38)
Albumin: 4.3 g/dL (ref 3.5–5.2)
Alkaline Phosphatase (Total): 123 U/L (ref 37–159)
Anion Gap: 7 (ref 4–12)
Bilirubin (Direct): 0.1 mg/dL (ref 0.0–0.3)
Bilirubin (Total): 0.6 mg/dL (ref 0.2–1.3)
Calcium: 8.9 mg/dL (ref 8.9–10.2)
Carbon Dioxide, Total: 27 meq/L (ref 22–32)
Chloride: 105 meq/L (ref 98–108)
Creatinine: 1.05 mg/dL (ref 0.51–1.18)
Glucose: 94 mg/dL (ref 62–125)
Phosphate: 3.2 mg/dL (ref 2.5–4.5)
Potassium: 3.7 meq/L (ref 3.6–5.2)
Protein (Total): 6.9 g/dL (ref 6.0–8.2)
Sodium: 139 meq/L (ref 135–145)
Urea Nitrogen: 20 mg/dL (ref 8–21)
eGFR by CKD-EPI 2021: >60 mL/min/{1.73_m2} (ref >59)

## 2021-07-12 LAB — URINALYSIS COMPLETE, URN
Bacteria, URN: NONE SEEN
Bilirubin (Qual), URN: NEGATIVE
Epith Cells_Renal/Trans,URN: NEGATIVE /HPF
Epith Cells_Squamous, URN: NEGATIVE /LPF
Glucose Qual, URN: NEGATIVE
Ketones, URN: NEGATIVE
Leukocyte Esterase, URN: NEGATIVE
Nitrite, URN: NEGATIVE
Occult Blood, URN: NEGATIVE
Protein (Alb Semiquant), URN: NEGATIVE
RBC, URN: NEGATIVE /HPF
Specific Gravity, URN: 1.024 g/mL (ref 1.006–1.027)
WBC, URN: NEGATIVE /HPF
pH, URN: 7 (ref 5.0–8.0)

## 2021-07-13 ENCOUNTER — Ambulatory Visit
Admission: RE | Admit: 2021-07-13 | Discharge: 2021-07-13 | Disposition: A | Discharge status: Home / Self Care | Payer: BLUE CROSS/BLUE SHIELD | Attending: Unknown Physician Specialty | Admitting: Unknown Physician Specialty

## 2021-07-13 DIAGNOSIS — R310 Gross hematuria: Secondary | ICD-10-CM | POA: Insufficient documentation

## 2021-07-13 LAB — URINE C/S: Culture: NO GROWTH

## 2021-07-13 MED ORDER — IOHEXOL 350 MG/ML IV SOLN
101.0000 mL | Freq: Once | INTRAVENOUS | Status: AC | PRN
Start: 2021-07-13 — End: 2021-07-13
  Administered 2021-07-13: 101 mL via INTRAVENOUS

## 2021-07-15 NOTE — Result Encounter Note (Signed)
The results of your CT IVP are reassuring!  There is evidence of inflammation and I want you to keep your urology appointment to review the findings and decide if  further evaluation or treatment is needed. My recommendation is to stay well hydrated and take the AZO probiotic for urinary tract prevention.       All other results are normal.    [name], ARNP, PhD

## 2021-07-18 ENCOUNTER — Encounter (HOSPITAL_COMMUNITY): Payer: Self-pay

## 2021-07-20 ENCOUNTER — Encounter (INDEPENDENT_AMBULATORY_CARE_PROVIDER_SITE_OTHER): Payer: Self-pay | Admitting: Family

## 2021-07-20 ENCOUNTER — Telehealth (INDEPENDENT_AMBULATORY_CARE_PROVIDER_SITE_OTHER): Payer: BLUE CROSS/BLUE SHIELD | Admitting: Family

## 2021-07-20 VITALS — Ht 70.0 in | Wt 250.0 lb

## 2021-07-20 DIAGNOSIS — K760 Fatty (change of) liver, not elsewhere classified: Secondary | ICD-10-CM

## 2021-07-20 DIAGNOSIS — R310 Gross hematuria: Secondary | ICD-10-CM

## 2021-07-20 NOTE — Progress Notes (Signed)
Health Maintenance Due   Topic   . Hepatitis B Vaccine (1 of 3 - 3-dose series)   . Diabetes Kidney Health Evaluation    . Pneumococcal Vaccine: Pediatrics (0-5 years) and At-Risk Patients (6-64 years) (2 - PCV)   . Diabetes Foot Exam    . Diabetes Eye Exam    . Depression Screening (PHQ-2)    . COVID-19 Vaccine (3 - Booster for Moderna series)   . Influenza Vaccine (1)       WAIIS/Care Everywhere/[facility] have been reconciled in Epic: YES  HM DUE :   Hepatitis B Vaccine(1 of 3 - 3-dose series)  Diabetes Kidney Health Evaluation  Pneumococcal Vaccine: Pediatrics (0-5 years) and At-Risk Patients (6-64 years)(2 - PCV)  Diabetes Foot Exam  Diabetes Eye Exam  Depression Screening (PHQ-2)  COVID-19 Vaccine(3 - Booster for Moderna series)  Influenza Vaccine(1)  Cale Decarolis Sunday Corn, [place], [place]  07/17/2021    OUTREACH:

## 2021-07-20 NOTE — Progress Notes (Signed)
Distant Site Telemedicine Encounter      I conducted this encounter via secure, live, face-to-face video conference with the patient. I reviewed the risks and benefits of telemedicine as pertinent to this visit and the patient agreed to proceed.     Provider Location: Off-site location (home, non-[place] location)  Patient Location: At home  Present with patient: No one else present          [facility] FAMILY MEDICINE OUTPATIENT VISIT       Anthony Andrade is scheduled today to discuss the following chief complaint(s)    HPI: He has returned to review lab work and CV IVP.    Hematuria:  He has no longer experienced a second episode of hematuria or pain in the night. IVP and [place] are normal for the renal system and bladder. He is hydrating more.    Fatty Liver: liver enzymes are elevated. He is aware of fatty liver. He does have a high fat diet in the past and has been working on his nutrition for awhile. He states he knows what he needs to eat. He states he is aware of reducing the calories.  He is not conducting an exercise program but thinks he managed about 8,000 steps per day.    HDQ:QIWLNL for what is documented in HPI and ROS, the complete ROS is negative.    The following portions of the patient's history were reviewed with the patient and updated as appropriate: problem list, current medications, allergies, past medical history, past surgical history, past social history and past family history.  Reviewed overdue health maintenance topics with patient.    OBJECTIVE:  Ht 5\' 10"  (1.778 m) Comment: Patient reported  Wt (!) 113.4 kg (250 lb) Comment: Patient reported  BMI 35.87 kg/m   Pt is well groomed and dressed, cooperative, no distress,  BMI is 35. Patient is alert and attentive to conversation.   Mood nd affect appear appropriate.    IMPRESSION and PLAN:  Marik Sedore presents with:    (R31.0) Gross hematuria  (primary encounter diagnosis)  Plan: ECONSULT TO UROLOGY  Will inquire about follow  up appointment in February.    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 Declined nutritionist referral      Pt understands the above plan of care with all questions answered to their satisfaction. Home instructions discussed.  AVS provided either by ecare or printed.    --  09-11-1996, PhD, ARNP  Family Medicine  [facility]  Phone #[phone]  Fax # [phone]

## 2021-07-23 ENCOUNTER — Other Ambulatory Visit (HOSPITAL_BASED_OUTPATIENT_CLINIC_OR_DEPARTMENT_OTHER): Payer: BLUE CROSS/BLUE SHIELD | Admitting: Urology

## 2021-07-23 DIAGNOSIS — R310 Gross hematuria: Secondary | ICD-10-CM

## 2021-07-23 DIAGNOSIS — R69 Illness, unspecified: Secondary | ICD-10-CM

## 2021-07-23 NOTE — Progress Notes (Signed)
Reason for eConsult:  [name], [name], ARNP submitted the following request:  NOTE: Please consider the following steps in advance of ordering an eConsult.  1. UA and urine culture to rule out infection.If UTI, treat and recheck UA in 4-6 weeks for clearing of blood.   2. Confirm a history of gross hematuria by patient report or positive UA with microscopy x2 with >3 rbc/hpf on micro (or whatever the normal range is for the lab), in the absence of UTI.  3. If no UTI and creatinine normal, then order CT-IVP. (Check radiology guidelines regarding contrast.Screen for contrast allergy and metformin use.)  4. If no UTI and creatinine is elevated, order renal ultrasound.  NOTE: Imaging studies must be within the past 6 months.     I am requesting an eConsult for this 56 year old male with hematuria. IVP negative and Ultrasound of kidney and bladder available. These were negative except residual fat noted which might represent inflammation of the bladder. He has not had another event after antibiotics and increase fluids.      My clinical question is: Should Daphine DeutscherMartin keep his urology appointment in Feb.?  He has not leave.   The following studies are available in [facility], [facility], or [facility]:   - UA, Urine culture C/S, Chem 7   Lab Results       Component                Value               Date                       COLORU                   Yellow              07/11/2021                 CLARITYU                 Hazy                07/11/2021                 SPGRAVU                  1.024               07/11/2021                 PHU                      7.0                 07/11/2021                 PROTEINU                 Negative            07/11/2021                 GLUCOSEU                 Negative            07/11/2021                 KETONEU  Negative            07/11/2021                 BILIRUBINU               Negative            07/11/2021                 OCCLTBLDU                Negative             07/11/2021                 NITRITESU                Negative            07/11/2021                 LEUKESTRASEU             Negative            07/11/2021                 UROBILINGNU              0.1-1.9             07/11/2021                 MACROCOMU                None                07/11/2021            Lab Results       Component                Value               Date                       SPEC                     Stool               05/30/2009                 SPEC                     Stool               05/30/2009                 SPECLREQ                 None                07/11/2021                 CULTURE                                      07/11/2021             No growth (<1000 Col/mL)       RPRTSTATUS  05/30/2009             Final 06/02/2009       RPRTSTATUS                                   05/30/2009             Final 06/02/2009 Lab Results       Component                Value               Date                       NA                       138                 04/08/2011                 CL                       105                 07/11/2021                 CO2                      27                  07/11/2021                 BUN                      20                  07/11/2021                 CREATININE               1.05                07/11/2021                 CA                       8.9                 07/11/2021                 GFRA                     >60                 01/01/2016                 GFRB                     >60                 01/01/2016            The following reports are available: CT IVP in [facility], [facility] or [facility] and Renal Ultrasound in [facility], [facility] or [facility]  If this clinical question is deemed too complex  for eConsult, please  schedule this patient for in-person consultation. This patient understands that they may receive a phone call from the specialty practice to schedule an appointment.     Babette RelicKerry E Meyer,  ARNP  07/20/2021    and I am requesting an eConsult for this 56 year old male.      My clinical question is: Daphine DeutscherMartin needs to know if he should keep his appointment with urology in February, 2023. He has a negative IVP and Ultrasound. No further hematuria. No further pain after antibiotic course and change in hydration.     If this clinical question is deemed too complex for eConsult, please  schedule this patient for in-person consultation. This patient understands that they may receive a phone call from the specialty practice to schedule an appointment.     Babette RelicKerry E Meyer, PhD, ARNP  07/20/2021  _____________________________________________________________________  After careful review of the patient's results above and the patient's information available in the medical record,  the following are my findings and recommendations:  Howard Pouchlaire C Wilfred Dayrit, MD  07/23/2021    1. Restatement of the question: Should patient undergo cystoscopy in evaluation for gross hematuria, despite negative CT IVP?    2. Recommendation(s): Patient should have cystoscopy.     3. Rationale and/or evidence for recommendation: Imaging of any kind is inadequate to evaluate the bladder for causes of hematuria. Need direct visualization via cystoscopy.    4. Contingency plan:     I spent a total time of 4 minutes reviewing and communicating the above findings and recommendations.      "This eConsult is based solely on the clinical information available to me in the patient's medical record and is provided without benefit of a comprehensive evaluation or physical examination of the patient. The information contained in this eConsult must be interpreted in light of any clinical issues or changes in patient status that were not known to me at the time this eConsult was completed. You must rely on your own informed clinical judgment for decision making. If necessary, we can schedule the patient for an in-office consultation."

## 2021-08-01 ENCOUNTER — Telehealth (INDEPENDENT_AMBULATORY_CARE_PROVIDER_SITE_OTHER): Payer: Self-pay

## 2021-08-01 NOTE — Telephone Encounter (Signed)
Panel Management Meeting Notes    Provider: Babette Relic, ARNP  Attendees:  Babette Relic, ARNP , Johnnette Litter - PN,   RN present: Yes -   SW present: No.  Additional attendees:      Care Gaps Reviewed:    Health Maintenance Due   Topic   . Hepatitis B Vaccine (1 of 3 - 3-dose series)   . Diabetes Kidney Health Evaluation    . Pneumococcal Vaccine: Pediatrics (0-5 years) and At-Risk Patients (6-64 years) (2 - PCV)   . Diabetes Foot Exam    . Diabetes Eye Exam    . Depression Screening (PHQ-2)    . COVID-19 Vaccine (3 - Booster for Moderna series)   . Influenza Vaccine (1)         Action(s):    1. Pt discussed in panel meeting  2. Per PCP, pt needs reminder to schedule Diabetic eye exam  Routing for outreach

## 2021-08-06 NOTE — Telephone Encounter (Signed)
Called pt to remind about scheduling a Diabetic eye exam  LVM  Sending mychart message

## 2021-08-10 ENCOUNTER — Encounter (HOSPITAL_BASED_OUTPATIENT_CLINIC_OR_DEPARTMENT_OTHER): Payer: BLUE CROSS/BLUE SHIELD | Admitting: Urology

## 2021-09-13 ENCOUNTER — Encounter (INDEPENDENT_AMBULATORY_CARE_PROVIDER_SITE_OTHER): Payer: Self-pay | Admitting: Family

## 2021-09-26 ENCOUNTER — Encounter (INDEPENDENT_AMBULATORY_CARE_PROVIDER_SITE_OTHER): Payer: Self-pay

## 2021-10-02 ENCOUNTER — Encounter (INDEPENDENT_AMBULATORY_CARE_PROVIDER_SITE_OTHER): Payer: Self-pay | Admitting: Family

## 2021-10-02 ENCOUNTER — Telehealth (INDEPENDENT_AMBULATORY_CARE_PROVIDER_SITE_OTHER): Payer: Self-pay | Admitting: Family

## 2021-10-02 NOTE — Telephone Encounter (Signed)
1st, 2nd, and 3rd attempt  Received note from [name], ARNP to schedule an office visit on a Physician Collaboration Fax Cover Sheet. Called, left vm, sent MyChart msg, and sent appt request via MyChat.    CCR - If pt calls back, please schedule a return visit with [name], ARNP.    CLOSE TE

## 2021-11-28 ENCOUNTER — Encounter (INDEPENDENT_AMBULATORY_CARE_PROVIDER_SITE_OTHER): Payer: Self-pay

## 2021-12-21 ENCOUNTER — Telehealth (INDEPENDENT_AMBULATORY_CARE_PROVIDER_SITE_OTHER): Payer: BLUE CROSS/BLUE SHIELD | Admitting: Family

## 2022-01-22 ENCOUNTER — Ambulatory Visit (INDEPENDENT_AMBULATORY_CARE_PROVIDER_SITE_OTHER): Payer: BLUE CROSS/BLUE SHIELD | Admitting: Family

## 2022-01-22 ENCOUNTER — Encounter (INDEPENDENT_AMBULATORY_CARE_PROVIDER_SITE_OTHER): Payer: Self-pay | Admitting: Family

## 2022-01-22 VITALS — BP 131/80 | HR 87 | Temp 98.5°F | Resp 16 | Ht 70.0 in | Wt 228.0 lb

## 2022-01-22 DIAGNOSIS — Z125 Encounter for screening for malignant neoplasm of prostate: Secondary | ICD-10-CM

## 2022-01-22 DIAGNOSIS — Z8249 Family history of ischemic heart disease and other diseases of the circulatory system: Secondary | ICD-10-CM

## 2022-01-22 DIAGNOSIS — R634 Abnormal weight loss: Secondary | ICD-10-CM

## 2022-01-22 DIAGNOSIS — I10 Essential (primary) hypertension: Secondary | ICD-10-CM

## 2022-01-22 DIAGNOSIS — Z0000 Encounter for general adult medical examination without abnormal findings: Secondary | ICD-10-CM

## 2022-01-22 DIAGNOSIS — R21 Rash and other nonspecific skin eruption: Secondary | ICD-10-CM

## 2022-01-22 NOTE — Progress Notes (Signed)
Health Maintenance Due   Topic    Hepatitis B Vaccine (1 of 3 - 3-dose series)    Diabetes Kidney Health Evaluation     Diabetes Eye Exam     Depression Screening (PHQ-2)     COVID-19 Vaccine (3 - Moderna series)    Diabetes A1c        WAIIS/Care Everywhere/[facility] have been reconciled in Epic: YES  HM DUE :   Hepatitis B Vaccine(1 of 3 - 3-dose series)  Diabetes Kidney Health Evaluation  Diabetes Foot Exam  Diabetes Eye Exam  Depression Screening (PHQ-2)  COVID-19 Vaccine(3 - Moderna series)  Diabetes A1c  Jacqualynn Parco Evelene Croon, CMA, CMA  01/21/2022    OUTREACH:

## 2022-01-22 NOTE — Progress Notes (Addendum)
SUBJECTIVE:    Anthony Andrade is a 56 year old male here for a wellness/preventive health care visit.     Additional concerns: Lost 15 pounds since May.  He is not certain why. He did have a reaction to Cosyntrix. He had a rash over whole body. End of march and beginning of April. He stopped it. Then he had a head cold in July and the rash returned.     Current dietary habits: healthy diet in general  Current exercise habits: no regular exercise but very active.  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] 10/09/2015    Type 2 diabetes mellitus without complication ([place]) [O96.2] 09/25/2015    Tonic pupillary reaction of left eye [H57.052] 09/07/2015     Late 1990s per pt/cause unknown      Hypertriglyceridemia [E78.1] 04/06/2015    Acute vestibular neuritis [H81.20] 11/16/2014    Cough [R05.9] 08/20/2013    Asthma [J45.909] 07/11/2013    Foot pain [M79.673] 04/21/2013    Hypothyroidism [E03.9] 09/11/2012    Allergic rhinitis due to allergen [J30.9] 04/18/2011     Overview:   Dog at home      Restless legs syndrome [G25.81] 11/14/2010    Unspecified sleep apnea [G47.30] 08/20/2010    Sleep apnea [G47.30] 08/20/2010    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 May substitute any brand that's covered. 18 g 1    amLODIPine 5 MG tablet One tablet every am 30 tablet 6    ASPIRIN 81 OR       Atorvastatin Calcium 20 MG Oral Tab Take 1 tablet (20 mg) by mouth daily. To lower cholesterol 90 tablet 0    azelastine 0.1 % nasal spray Spray 1-2 sprays into each nostril 2 times a day. 30 mL 6    Blood Glucose Monitoring Suppl Kit Use to check blood sugar once daily 1 kit 0    Budesonide-Formoterol Fumarate 80-4.5 MCG/ACT Inhalation Aerosol Inhale 2 puffs by mouth every 12 hours. 1 Inhaler 3    certolizumab 2 X 952 MG/ML injection kit Inject 1 mL (200 mg) under the skin every 2 weeks. (Patient not taking: Reported on 01/22/2022)      clotrimazole-betamethasone 1-0.05 % cream Apply 1 application topically 2 times a day as needed (rash). Apply to buttocks 45 g 6    Erenumab-aooe 70 MG/ML Subcutaneous Solution Auto-injector Inject 2 auto-injectors (140 mg) under the skin every 4 weeks. (Patient not  taking: Reported on 01/22/2022)      fluticasone-salmeterol 250-50 MCG/DOSE diskus inhaler Inhale 1 puff by mouth 2 times a day. 60 each 3    galcanezumab-GNLM (Emgality) 120 MG/ML auto-injector inject 1 milliliter subcutaneously every month      Glucose Blood (BLOOD GLUCOSE TEST) In Vitro Strip Use 1 strip daily. Use to check blood sugar. 100 strip 1    Lancets Thin Misc Use 1 each daily. 100 each 1    Levothyroxine Sodium 88 MCG Oral Tab Take 1 tablet (88 mcg) by mouth daily on an empty stomach. 90 tablet 1    Losartan Potassium 50 MG Oral Tab take 1 tablet by mouth twice a day 60 tablet 5    Meloxicam 15 MG Oral Tab Take 1 tablet (15 mg) by mouth daily. 90 tablet 2    MetFORMIN HCl ER 750 MG Oral TABLET SR 24 HR Take 1 tablet (750 mg) by mouth daily. 90  tablet 0    Montelukast Sodium 10 MG Oral Tab Take 1 tablet (10 mg) by mouth every evening. (Patient not taking: Reported on 01/22/2022) 90 tablet 3    Olopatadine HCl 0.6 % Nasal Solution Spray 2 sprays into each nostril 2 times a day. (Patient not taking: Reported on 01/22/2022) 1 bottle 6    pantoprazole 40 MG packet Take 1 packet (40 mg) by mouth.      pregabalin 225 MG capsule  (Patient not taking: Reported on 01/22/2022)      Pregabalin 225 MG Oral Cap One tablet at dinner and one tablet at bedtime 60 capsule 3    Respiratory Therapy Supplies (NEBULIZER) Device Use with albuterol as needed every 4 hours. NEEDS PORTABLE with battery 1 each 0    rOPINIRole 4 MG tablet Take 1 tablet (4 mg) by mouth.      topiramate 100 MG tablet Take 1 tablet (100 mg) by mouth daily.      ubrogepant 100 MG tablet take 1 tablet by mouth every 2 hours if needed for migraines maximum daily dose of 2       No current facility-administered medications for this visit.        REVIEW OF SYSTEMS:  CONSTITUTIONAL: Negative for, daytime somnolence, fever, chills, night sweats, Denies  REGULAR DENTAL EXAMS: yes  NEUROLOGIC: Negative for, syncope, signficant memory problems, and tremors  OPHTHALMIC: any vision problems, blurry vision, diplopia, eye pain, Denies  ENT: Negative for, hearing difficulties, any ear nose or throat problems, and Denies  RESPIRATORY: Negative for, dyspnea on exertion, dyspnea at rest, cough, wheezing  CARDIOVASCULAR: Negative for, chest pain or pressure at rest or during exercise, palpitations, peripheral edema, Denies  GI: Negative for, change in appetite, dysphagia, nausea, vomiting, dyspepsia, abdominal pain, diarrhea, constipation, hematochezia, melena  GENITOURINARY: Negative for, hematuria, dysuria, any genito-urinary problems  INTEGUMENTARY: Negative for, changes in moles or new moles, any skin problems, Denies  RHEUMATOLOGIC/MSK: Negative for, joint swelling, .  Does have back pain and at times multiple joint  pain  ENDOCRINE: Negative for, prior blood sugar problems, Denies  PSYCHOSOCIAL: Negative for, depressed mood, anxiety, and Denies    PHYSICAL EXAM:  BP 131/80   Pulse 87   Temp 36.9 C (Temporal)   Resp 16   Ht _0  (1.778 m)   Wt (!) 103.4 kg (228 lb)   SpO2 98%   BMI 32.71 kg/m    General: no acute distress, appears healthy, comfortable  Skin: Skin color, texture, turgor normal. No rashes or concerning  lesions  Head: Normocephalic. No masses, lesions, tenderness or abnormalities  Eyes: Lids/periorbital skin normal, Conjunctivae/corneas clear, PERRL, EOM's intact  Ears: External ears normal. Canals clear. TM's normal.  Nose: normal  Oropharynx: normal  Teeth: normal dentition for age  Neck: supple. No adenopathy. Thyroid symmetric, normal size, without nodules  Lungs: clear to auscultation  Heart: normal rate, regular rhythm and no murmurs, clicks, or gallops  Abd: soft, non-tender. BS normal. No masses or organomegaly  GU: deferred  Rectal: deferred  Prostate: deferred  Spine: Back symmetric, no deformity; ROM normal; No CVA tenderness.  Ext: Normal, without deformities, edema, or skin discoloration, radial and DP pulses 2+ bilaterally  Neuro:  Grossly normal to observation, gait normal  -----------------------------------------    ASSESSMENT AND PLAN:      (Z00.00) Encounter for wellness examination  (primary encounter diagnosis)  Plan: numerous chronic concerns but appears to be managing    (R63.4) Weight loss, unintentional  Plan: LIPASE       (Z12.5) Screening for malignant neoplasm of prostate  Plan: PSA, Screening    (R21) Blistering rash  Plan: Tryptase, Quantiferon-TB Gold Plus    (Z82.49) Family history of cardiac disorder  Plan: Referral to Cardiology    (I10) Hypertension, essential  Plan: Referral to Cardiology      Health Maintenance   Topic Date Due    Hepatitis B Vaccine (1 of 3 - 3-dose series) Never done    Diabetes Kidney Health Evaluation  Never done    Diabetes Eye Exam  09/06/2016     Depression Screening (PHQ-2)  09/07/2018    COVID-19 Vaccine (3 - Moderna series) 12/28/2019    Diabetes A1c  09/16/2021    Influenza Vaccine (1) 03/24/2022    Diabetes Foot Exam  01/23/2023    Lipid Disorders Screening  11/30/2023    Colorectal Cancer Screening  06/27/2027    DTaP, Tdap and Td Vaccines (2 - Td or Tdap) 09/09/2027    Zoster Vaccine  Completed    Hepatitis C Screening  Completed    Pneumococcal Vaccine: Pediatrics (0-5 years) and At-Risk Patients (6-64 years)  Completed    HIV Screening  Addressed    Hepatitis A Vaccine  Aged Out     Immunizations or studies due: see orders        Preventive counseling: skin Ca prevention & skin self-exam, healthy dietary guidelines, proper exercise

## 2022-01-27 NOTE — Addendum Note (Signed)
Addended by: Eliane Decree on: 01/27/2022 10:01 PM     Modules accepted: Orders

## 2022-01-28 ENCOUNTER — Other Ambulatory Visit (INDEPENDENT_AMBULATORY_CARE_PROVIDER_SITE_OTHER): Payer: BLUE CROSS/BLUE SHIELD

## 2022-01-28 DIAGNOSIS — Z125 Encounter for screening for malignant neoplasm of prostate: Secondary | ICD-10-CM

## 2022-01-28 DIAGNOSIS — R21 Rash and other nonspecific skin eruption: Secondary | ICD-10-CM

## 2022-01-28 DIAGNOSIS — R634 Abnormal weight loss: Secondary | ICD-10-CM

## 2022-01-29 ENCOUNTER — Encounter (INDEPENDENT_AMBULATORY_CARE_PROVIDER_SITE_OTHER): Payer: Self-pay | Admitting: Family

## 2022-01-29 LAB — TRYPTASE: Tryptase Result: 2 ng/mL (ref 0–12)

## 2022-01-29 LAB — PSA, SCREENING: PSA, Screening: 3.75 ng/mL (ref 0.00–4.00)

## 2022-01-29 LAB — LIPASE: Lipase: 27 U/L (ref <70)

## 2022-01-29 NOTE — Result Encounter Note (Signed)
Your lab results are normal.   Please let me know if you have any question.

## 2022-01-29 NOTE — Result Encounter Note (Signed)
The results of your lipase and PSA are normal. However, your PSA has increased. Be sure to consume Omega 3s daily or take a supplement.      All other results are normal.    [name], ARNP, PhD

## 2022-01-30 LAB — QUANTIFERON-TB GOLD PLUS
Quantiferon Nil Result: 0.02 IU gamma IF/mL
Quantiferon TB Interpretation: NEGATIVE
Quantiferon TB Mitogen minus Nil Result: >10 IU gamma IF/mL
Quantiferon TB1 Ag minus Nil Result: 0 IU gamma IF/mL
Quantiferon TB2 Ag minus Nil Result: 0 IU gamma IF/mL

## 2022-02-06 NOTE — Result Encounter Note (Signed)
Your lab results are normal.   Please let me know if you have any question.

## 2022-03-12 ENCOUNTER — Ambulatory Visit (INDEPENDENT_AMBULATORY_CARE_PROVIDER_SITE_OTHER)
Admit: 2022-03-12 | Discharge: 2022-03-12 | Disposition: A | Discharge status: Home / Self Care | Payer: BLUE CROSS/BLUE SHIELD

## 2022-03-12 ENCOUNTER — Encounter (INDEPENDENT_AMBULATORY_CARE_PROVIDER_SITE_OTHER): Payer: Self-pay | Admitting: Family

## 2022-03-12 ENCOUNTER — Ambulatory Visit (INDEPENDENT_AMBULATORY_CARE_PROVIDER_SITE_OTHER): Payer: BLUE CROSS/BLUE SHIELD | Admitting: Family

## 2022-03-12 VITALS — BP 146/80 | HR 85 | Temp 99.3°F | Resp 14 | Wt 216.8 lb

## 2022-03-12 DIAGNOSIS — S99911A Unspecified injury of right ankle, initial encounter: Secondary | ICD-10-CM

## 2022-03-12 DIAGNOSIS — R634 Abnormal weight loss: Secondary | ICD-10-CM

## 2022-03-12 DIAGNOSIS — R7309 Other abnormal glucose: Secondary | ICD-10-CM

## 2022-03-12 NOTE — Progress Notes (Signed)
Health Maintenance Due   Topic    Hepatitis B Vaccine (1 of 3 - 3-dose series)    Diabetes Kidney Health Evaluation     Diabetes Eye Exam     Depression Screening (PHQ-2)     COVID-19 Vaccine (3 - Moderna series)    Diabetes A1c        WAIIS/Care Everywhere/[facility] have been reconciled in Epic: YES  HM DUE :   Hepatitis B Vaccine(1 of 3 - 3-dose series)  Diabetes Kidney Health Evaluation  Diabetes Eye Exam  Depression Screening (PHQ-2)  COVID-19 Vaccine(3 - Moderna series)  Diabetes A1c  Maurine Simmer[place], [place]  03/01/2022    OUTREACH:

## 2022-03-12 NOTE — Progress Notes (Signed)
[facility] OUTPATIENT VISIT       Anthony Andrade is scheduled today to discuss the following chief complaint(s)    HPI: weight loss  He has lost 34 pounds since January. Unsure of cause. Concerned about cancer or another high risk problem. No night sweats. Able to work 60 hrs weekly. Tired but working. Denies shortness of breath or chest pain. No aches and pains that are unusual. Eating healthier with high protein based meals. Does drink alcohol several x per week. No THC. Staying hydrated. Not sure why he is losing weight.     Injury to his right ankle. Pt tripped, caught ankle. Worried about plates and screws. Started to swell and turned black and blue. Iced last night. Walking with a limp. Pain 5/10 or more. Denies numbness.  Surgery in 2021.      ENI:DPOEUM for what is documented in HPI and ROS, the complete ROS is negative.    The following portions of the patient's history were reviewed with the patient and updated as appropriate: problem list, current medications, allergies, past medical history, past surgical history, past social history and past family history.  Reviewed overdue health maintenance topics with patient.    OBJECTIVE:  BP (!) 146/80   Pulse 85   Temp 37.4 C (Temporal)   Resp 14   Wt 98.3 kg (216 lb 12.8 oz)   SpO2 98%   BMI 31.11 kg/m   Patient is cooperative, casual dressed. Alert.no distress.  Walking with a limp. Slimer but appears healthy.   Eyes bright. Facial expressions appropriate  Thyroid is not enlarged  Neck supple  Heart Regular rate and rhythm, S1 and S2 present without murmur  Lungs: Clear no rales, rhonchi, breath sounds equal.Abdomen: soft, no masses and not tender  Full range of motion of lower extremities without lumps bumps  Lymph nodes from neck to groin non enlarged.  Right ankle: edema noted surrounding lateral calcaneous. Bruised. Warm not hot. Decreased range of motion. Painful to touch.     IMPRESSION and PLAN:  Anthony Andrade  presents with:    [phone]) Right ankle injury, initial encounter  (primary encounter diagnosis)  Plan: XR Ankle 3+ View Right  Xray noted to illustrate a screw displaced. Did not see new fractures but will wait for radiology reading.    (R63.4) Weight loss  Plan: TSH with Reflexive Free T4, POC Whole Blood         A1C, CBC with Diff, XR Chest 2 View   Xray noted for clear lung bases. No lesions or masses noted. Did not a screw in possible left upper pocket.    (R73.09) Elevated glucose  Plan: POC Whole Blood A1C, Hemoglobin A1C, HPLC        Last reading below 7.0      Pt understands the above plan of care with all questions answered to their satisfaction. Home instructions discussed.  AVS provided either by ecare or printed.    Discussed any prescribed medication dosage, usage, side effects, & therapy goals. Patient understands & is agreeable to the plan of care. Patient encouraged to read the handouts from pharmacy regarding medications.   Dictation with Editor, commissioning and may include misspellings    --  Nelva Bush, PhD, [place]  [facility]  Phone [phone]  Fax # [phone]

## 2022-03-13 LAB — CBC, DIFF
% Basophils: 1 %
% Eosinophils: 3 %
% Immature Granulocytes: 0 %
% Lymphocytes: 28 %
% Monocytes: 11 %
% Neutrophils: 57 %
% Nucleated RBC: 0 %
Absolute Eosinophil Count: 0.31 10*3/uL (ref 0.00–0.50)
Absolute Lymphocyte Count: 2.98 10*3/uL (ref 1.00–4.80)
Basophils: 0.07 10*3/uL (ref 0.00–0.20)
Hematocrit: 42 % (ref 38.0–50.0)
Hemoglobin: 14.3 g/dL (ref 13.0–18.0)
Immature Granulocytes: 0.03 10*3/uL (ref 0.00–0.05)
MCH: 30.9 pg (ref 27.3–33.6)
MCHC: 33.8 g/dL (ref 32.2–36.5)
MCV: 91 fL (ref 81–98)
Monocytes: 1.13 10*3/uL — ABNORMAL HIGH (ref 0.00–0.80)
Neutrophils: 5.96 10*3/uL (ref 1.80–7.00)
Nucleated RBC: 0 10*3/uL
Platelet Count: 177 10*3/uL (ref 150–400)
RBC: 4.63 10*6/uL (ref 4.40–5.60)
RDW-CV: 13.2 % (ref 11.0–14.5)
WBC: 10.48 10*3/uL — ABNORMAL HIGH (ref 4.3–10.0)

## 2022-03-13 LAB — TSH WITH REFLEXIVE FREE T4: TSH with Reflexive Free T4: 2.432 u[IU]/mL (ref 0.400–5.000)

## 2022-03-13 LAB — HEMOGLOBIN A1C, HPLC: Hemoglobin A1C: 6 % (ref 4.0–6.0)

## 2022-03-13 NOTE — Result Encounter Note (Signed)
Your lab results are normal. The monocytes are slightly elevated and this can be from stress, medications, smoking, excessive exercise or injury (such as your ankle)  Please let me know if you have any question.

## 2022-03-13 NOTE — Result Encounter Note (Signed)
The results of your xray chest is clear. Screw is from your shoulder.      All other results are normal.    Nelva Bush, ARNP, PhD

## 2022-03-13 NOTE — Result Encounter Note (Signed)
The results of your xray suggests No new fractures or misalignment, mild arthritis, a suture anchor which may be the floating screw I am seeing. Treat as as sprain.      All other results are normal.    Nelva Bush, ARNP, PhD

## 2022-03-19 ENCOUNTER — Telehealth (INDEPENDENT_AMBULATORY_CARE_PROVIDER_SITE_OTHER): Payer: Self-pay | Admitting: Family

## 2022-03-19 ENCOUNTER — Encounter (INDEPENDENT_AMBULATORY_CARE_PROVIDER_SITE_OTHER): Payer: Self-pay | Admitting: Family

## 2022-03-19 NOTE — Telephone Encounter (Signed)
RETURN CALL: Voicemail - Detailed Message      SUBJECT:  General Message     MESSAGE: Patient called with concerns of why listed PCP has not responded to his 03/14/2022 message he sent her. He is aware of the response from her on 03/12/2022. He states the message asked if he wanted to reply and he did. Patient would like PCP to follow up to his reply at this time.

## 2022-03-20 ENCOUNTER — Encounter (INDEPENDENT_AMBULATORY_CARE_PROVIDER_SITE_OTHER): Payer: Self-pay | Admitting: Family

## 2022-03-29 ENCOUNTER — Telehealth (INDEPENDENT_AMBULATORY_CARE_PROVIDER_SITE_OTHER): Payer: BLUE CROSS/BLUE SHIELD | Admitting: Family

## 2022-03-29 VITALS — Wt 210.0 lb

## 2022-03-29 DIAGNOSIS — E119 Type 2 diabetes mellitus without complications: Secondary | ICD-10-CM

## 2022-03-29 DIAGNOSIS — M450 Ankylosing spondylitis of multiple sites in spine: Secondary | ICD-10-CM

## 2022-03-29 DIAGNOSIS — N528 Other male erectile dysfunction: Secondary | ICD-10-CM

## 2022-03-29 DIAGNOSIS — D849 Immunodeficiency, unspecified: Secondary | ICD-10-CM

## 2022-03-29 DIAGNOSIS — G47411 Narcolepsy with cataplexy: Secondary | ICD-10-CM

## 2022-03-29 DIAGNOSIS — R319 Hematuria, unspecified: Secondary | ICD-10-CM

## 2022-03-29 DIAGNOSIS — Z13220 Encounter for screening for lipoid disorders: Secondary | ICD-10-CM

## 2022-03-29 DIAGNOSIS — R5383 Other fatigue: Secondary | ICD-10-CM

## 2022-03-29 DIAGNOSIS — J4532 Mild persistent asthma with status asthmaticus: Secondary | ICD-10-CM

## 2022-03-29 DIAGNOSIS — R634 Abnormal weight loss: Secondary | ICD-10-CM

## 2022-03-29 DIAGNOSIS — R82998 Other abnormal findings in urine: Secondary | ICD-10-CM

## 2022-03-29 DIAGNOSIS — E039 Hypothyroidism, unspecified: Secondary | ICD-10-CM

## 2022-03-29 NOTE — Progress Notes (Unsigned)
[facility] Telemedicine Encounter      I conducted this encounter via secure, live, face-to-face video conference with the patient. I reviewed the risks and benefits of telemedicine as pertinent to this visit and the patient agreed to proceed.     Provider Location: Off-site location (home, non-[place] location)  Patient Location: At home  Present with patient: No one else present         [place] is scheduled today to discuss the following chief complaint(s)    HPI: Weight loss continues without explaining except the metformin and possibly topamax for migraines. No change in activity level. Reports feeling good. Sleeping well. No night sweats or other symptoms. BMI is currently 29.6 down from over 32 a few months ago. All labs to day are normal. He has been experiencing muscle cramps at night. He is most concerned about hormone, muscle loss. Muscle cramps at night    Pt is also requesting additional referrals for other stable conditions.    [id number] for what is documented in HPI and ROS, the complete ROS is negative.    The following portions of the patient's history were reviewed with the patient and updated as appropriate: problem list, current medications, allergies, past medical history, past surgical history, past social history and past family history.  Reviewed overdue health maintenance topics with patient.      OBJECTIVE:  There were no vitals taken for this visit.  Medications are up to date.   No distress. Voice is clear. Speech is appropriate speed.    No labored breathing.   Skin clear.   Mood and affect are appropriate to situation.    IMPRESSION and PLAN:  Thayne Okin presents with:  I spent a total of 51 minutes for the patient's care on the date of the service. Reviewed all consult notes, reviewed lab work, discussed options for additional evaluation with patient and wife. Education on symptoms of pancreatitis and cancer  concerning conditions.    (R63.4) Weight loss, abnormal  (primary encounter diagnosis)  Plan: CT Chest w Contrast, CT Abdomen And Pelvis w         Contrast, Testosterone Free and Total, Folate         and Vitamin B12  Hold metformin for now.    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  Plan: Referral to Urology    (E11.9) Type 2 diabetes mellitus without complication, without long-term current use of insulin ([place])  Plan: Referral to Endocrinology-Diabetes,         Albumin/Creatinine Ratio, Random Urine    (E03.9) Hypothyroidism, unspecified type  Plan: Referral to Endocrinology-Diabetes          (R53.83) Fatigue, unspecified type  Plan: CT Chest w Contrast, CT Abdomen And Pelvis w         Contrast, Testosterone Free and Total, Folate  and Vitamin B12        (Z13.220) Screening, lipid  Plan: Lipid Panel (Future)       (G47.411) Transient total loss of muscle tone  Plan: CK, Creatine Kinase, Total Activity    Refills were reviewed and provided per patient's request. Immunizations were ordered per patient agreement. Patient was reminded of preventative care measures, scheduled or completed when appropriate.    Pt understands the above plan of care with all questions answered to their satisfaction. Home instructions discussed.  AVS provided either by ecare or printed.    Discussed any prescribed medication dosage, usage, side effects, & therapy goals. Patient understands & is agreeable to the plan of care. Patient encouraged to read the handouts from pharmacy  regarding medications.   Dictation with Editor, commissioning and may include misspellings    --  Nelva Bush, PhD, [place]  [facility]  Phone [phone]  Fax # [phone]

## 2022-03-29 NOTE — Telephone Encounter (Signed)
Pt had phone appointment regarding these questions.     Closing TE.

## 2022-03-29 NOTE — Telephone Encounter (Signed)
Pt had phone appointment regarding these questions.     Closing TE.

## 2022-03-30 NOTE — Progress Notes (Signed)
Patient had appointment with Levada Dy 03/29/22.    Closing TE.

## 2022-04-03 ENCOUNTER — Encounter (INDEPENDENT_AMBULATORY_CARE_PROVIDER_SITE_OTHER): Payer: BLUE CROSS/BLUE SHIELD | Admitting: Family

## 2022-04-03 ENCOUNTER — Ambulatory Visit (HOSPITAL_BASED_OUTPATIENT_CLINIC_OR_DEPARTMENT_OTHER): Payer: BLUE CROSS/BLUE SHIELD

## 2022-04-05 ENCOUNTER — Telehealth (INDEPENDENT_AMBULATORY_CARE_PROVIDER_SITE_OTHER): Payer: Self-pay

## 2022-04-05 NOTE — Telephone Encounter (Signed)
Appt scheduled

## 2022-04-05 NOTE — Telephone Encounter (Signed)
RETURN CALL: Voicemail - Detailed Message      SUBJECT:  Appointment Request     REASON FOR VISIT: Hematuria, unspecified type  Other male erectile dysfunction  Crystalluria  PREFERRED DATE/TIME: Flexible   REASON UNABLE TO APPOINT: no appts with Dr. Martinique please contact patient

## 2022-04-06 ENCOUNTER — Other Ambulatory Visit (INDEPENDENT_AMBULATORY_CARE_PROVIDER_SITE_OTHER): Payer: BLUE CROSS/BLUE SHIELD

## 2022-04-06 DIAGNOSIS — N528 Other male erectile dysfunction: Secondary | ICD-10-CM

## 2022-04-06 DIAGNOSIS — E119 Type 2 diabetes mellitus without complications: Secondary | ICD-10-CM

## 2022-04-06 DIAGNOSIS — R634 Abnormal weight loss: Secondary | ICD-10-CM

## 2022-04-06 DIAGNOSIS — Z13220 Encounter for screening for lipoid disorders: Secondary | ICD-10-CM

## 2022-04-06 DIAGNOSIS — G47411 Narcolepsy with cataplexy: Secondary | ICD-10-CM

## 2022-04-06 DIAGNOSIS — R5383 Other fatigue: Secondary | ICD-10-CM

## 2022-04-06 LAB — FOLATE & VITAMIN B12
Folate, SRM: >22 ng/mL (ref >5.8)
Vitamin B12 (Cobalamin): 394 pg/mL (ref 180–914)

## 2022-04-06 LAB — LIPID PANEL
Cholesterol/HDL Ratio: 3.6
HDL Cholesterol: 32 mg/dL — ABNORMAL LOW (ref >39)
LDL Cholesterol, NIH Equation: 67 mg/dL (ref <130)
Non-HDL Cholesterol: 84 mg/dL (ref 0–159)
Total Cholesterol: 116 mg/dL (ref <200)
Triglyceride: 89 mg/dL (ref <150)

## 2022-04-06 LAB — ALBUMIN/CREATININE RATIO, RANDOM URINE
Albumin (Micro), URN: <0.7 mg/dL
Albumin/Creatinine Ratio, URN: <10 mg/g{creat} (ref <30)
Creatinine/Unit, URN: 69 mg/dL

## 2022-04-06 LAB — CK, CREATINE KINASE, TOTAL ACTIVITY: Creatine Kinase Total Activity: 265 U/L (ref 62–325)

## 2022-04-06 LAB — A1C RAPID, ONSITE: HEMOGLOBIN A1C, ONSITE: 6.4 % — ABNORMAL HIGH (ref 4.0–6.0)

## 2022-04-07 ENCOUNTER — Encounter (INDEPENDENT_AMBULATORY_CARE_PROVIDER_SITE_OTHER): Payer: Self-pay | Admitting: Family

## 2022-04-07 NOTE — Result Encounter Note (Signed)
The results of your High Density Lipids are too low. More cardiovascular exercise and Omega 3s will improve this and protect you.  I recommend Nordic Natural Omega 3s x 2 with high dose of EPA.        All other results are normal.    Nelva Bush, ARNP, PhD

## 2022-04-07 NOTE — Result Encounter Note (Signed)
The results of your A1C is slightly elevated. Were you on the Metformin when you have this lab work drawn?    Nelva Bush, Kerby Nora, PhD

## 2022-04-09 NOTE — Result Encounter Note (Signed)
The results of your A1C is increasing off the Metformin. Let me know if you are willing to restart the medication. Monocytes are elevated and I am looking into this further. The good news is that your white blood cells are in a good range.  Monocytes can be elevated due to stress or inflammation and you do have both.      Nelva Bush, Kerby Nora, PhD

## 2022-04-10 LAB — TESTOSTERONE FREE & TOTAL
Sex Hormone Binding Globulin: 16 nmol/L (ref 13–90)
Testosterone Free: 83 pg/mL (ref 38.7–147.0)
Testosterone: 289 ng/dL (ref 264–916)

## 2022-04-11 ENCOUNTER — Other Ambulatory Visit: Payer: Self-pay

## 2022-04-15 ENCOUNTER — Telehealth (INDEPENDENT_AMBULATORY_CARE_PROVIDER_SITE_OTHER): Payer: Self-pay | Admitting: Family

## 2022-04-15 NOTE — Telephone Encounter (Signed)
Patient Name: Mauricio Dahlen [facility]  Referral # (if applicable): [id number]     Primary Insurance Name:  [place]  ID#: [id number]     Which insurance is the follow-up notes for?   Primary  Pending Reference #: [id number]   Method of follow up Fax     Status of Authorization: Denied- Did not meet medical necessity  Denial Detail Summary:   Is peer to peer review available? Yes- Called [facility] and spoke with [place]. She confirmed that we have 30 calendar days for a P2P to be initiated.  P2P Phone number: [phone]     Is additional documentation required? NO     Patient is scheduled on 04/16/2022.  Should patient reschedule.  Can P2P be done today.

## 2022-04-15 NOTE — Telephone Encounter (Signed)
Received call from [facility] requesting peer to peer from Nelva Bush, [place], PhD. Provider out today, escalated to provider of the day and secure chat sent to MA for assistance. Escalated to high priority.

## 2022-04-15 NOTE — Result Encounter Note (Signed)
Your testosterone level is appropriate for age 56. Your lipid profile indicates low levels of the HAPPY/HIGH Density lipids. This protect. Cardio Exercise and Omega 3s make a different. This will also protect your brain and improve diabetes prevention.  Natural Nordic Omega 3 x 2 is my recommendations. Please report your most recent body weight and if you are still holding Metformin.     Please let me know if you have any question.

## 2022-04-16 ENCOUNTER — Telehealth (INDEPENDENT_AMBULATORY_CARE_PROVIDER_SITE_OTHER): Payer: Self-pay | Admitting: Family

## 2022-04-16 ENCOUNTER — Ambulatory Visit
Admission: RE | Admit: 2022-04-16 | Discharge: 2022-04-16 | Disposition: A | Discharge status: Home / Self Care | Payer: BLUE CROSS/BLUE SHIELD | Attending: Diagnostic Radiology | Admitting: Diagnostic Radiology

## 2022-04-16 DIAGNOSIS — R319 Hematuria, unspecified: Secondary | ICD-10-CM

## 2022-04-16 DIAGNOSIS — R110 Nausea: Secondary | ICD-10-CM

## 2022-04-16 DIAGNOSIS — R634 Abnormal weight loss: Secondary | ICD-10-CM | POA: Insufficient documentation

## 2022-04-16 DIAGNOSIS — R5383 Other fatigue: Secondary | ICD-10-CM | POA: Insufficient documentation

## 2022-04-16 DIAGNOSIS — D849 Immunodeficiency, unspecified: Secondary | ICD-10-CM | POA: Insufficient documentation

## 2022-04-16 DIAGNOSIS — R195 Other fecal abnormalities: Secondary | ICD-10-CM

## 2022-04-16 LAB — CREATININE BY I-STAT (POC), ESC: Creatinine (POC): 0.9 mg/dL (ref 0.51–1.18)

## 2022-04-16 MED ORDER — IOHEXOL 350 MG/ML IV SOLN
121.0000 mL | Freq: Once | INTRAVENOUS | Status: AC | PRN
Start: 2022-04-16 — End: 2022-04-16
  Administered 2022-04-16: 121 mL via INTRAVENOUS

## 2022-04-16 MED ORDER — IOHEXOL 9 MG/ML OR SOLN
500.0000 mL | Freq: Once | ORAL | Status: AC | PRN
Start: 2022-04-16 — End: 2022-04-16
  Administered 2022-04-16: 500 mL via ORAL

## 2022-04-16 NOTE — Telephone Encounter (Signed)
Pt's CT CHEST W CONTRAST has been denied by insurance and requires P2P for denial reconsideration    Please call [facility] @ [phone] for P2P    Reference #: [id number]     Pt's BCBS ID#: [id number]     Referral was closed - cancelled order    Per 04/16/2022 radiology appt notes:    Authed, updated [place], gm/pc 04/15/22 per MD no longer wants Chest, will put new order     CT ABDOMEN AND PELVIS W CONTRAST placed on 04/16/2022 - pt scheduled for this exam on 10/24 off of referral#: [id number]       Confirmed with radiology that nothing further is needed at this time    Closing TE

## 2022-04-17 ENCOUNTER — Encounter (INDEPENDENT_AMBULATORY_CARE_PROVIDER_SITE_OTHER): Payer: BLUE CROSS/BLUE SHIELD | Admitting: Family

## 2022-04-23 ENCOUNTER — Encounter (INDEPENDENT_AMBULATORY_CARE_PROVIDER_SITE_OTHER): Payer: Self-pay | Admitting: Family

## 2022-04-23 NOTE — Result Encounter Note (Signed)
Your lab results are normal.   Please let me know if you have any question.

## 2022-05-10 ENCOUNTER — Emergency Department: Payer: Self-pay

## 2022-05-17 ENCOUNTER — Other Ambulatory Visit (INDEPENDENT_AMBULATORY_CARE_PROVIDER_SITE_OTHER): Payer: Self-pay | Admitting: Family

## 2022-05-17 ENCOUNTER — Encounter (INDEPENDENT_AMBULATORY_CARE_PROVIDER_SITE_OTHER): Payer: Self-pay | Admitting: Family

## 2022-05-17 DIAGNOSIS — I10 Essential (primary) hypertension: Secondary | ICD-10-CM

## 2022-05-20 MED ORDER — AMLODIPINE BESYLATE 5 MG OR TABS
ORAL_TABLET | ORAL | 6 refills | Status: DC
Start: 2022-05-20 — End: 2022-06-19

## 2022-05-20 NOTE — Telephone Encounter (Signed)
Refill message routed to PCP

## 2022-06-19 ENCOUNTER — Encounter (INDEPENDENT_AMBULATORY_CARE_PROVIDER_SITE_OTHER): Payer: Self-pay | Admitting: Family

## 2022-06-19 ENCOUNTER — Telehealth (INDEPENDENT_AMBULATORY_CARE_PROVIDER_SITE_OTHER): Payer: BLUE CROSS/BLUE SHIELD | Admitting: Family

## 2022-06-19 DIAGNOSIS — E781 Pure hyperglyceridemia: Secondary | ICD-10-CM

## 2022-06-19 DIAGNOSIS — G2581 Restless legs syndrome: Secondary | ICD-10-CM

## 2022-06-19 DIAGNOSIS — I10 Essential (primary) hypertension: Secondary | ICD-10-CM

## 2022-06-19 NOTE — Progress Notes (Signed)
Distant Site Telemedicine Encounter      I conducted this encounter via secure, live, face-to-face video conference with the patient. I reviewed the risks and benefits of telemedicine as pertinent to this visit and the patient agreed to proceed.     Provider Location: Off-site location (home, non-[place] location)  Patient Location: At home  Present with patient: No one else present            [facility] FAMILY MEDICINE OUTPATIENT VISIT       Anthony Andrade is scheduled today to discuss the following chief complaint(s)    HPI: He is on zoom to discuss leg cramps.  -wakes up 2-3 x at night with cramps.   -takes super Bs and he is on Metformin  - he is interested in reviewing his medication list including amlodipine. Been on the this medication since 2017.   Reduce then stop   Drop down to 5 mg then get off of it to see if this makes a different.     He takes atrovastatin 20 mg daily   His triglycerides were over 1000 about seven yrs ago and now under 100.    He is down to 210 from    He is concerned statin is contributing to the leg cramps.      VOH:YWVPXT for what is documented in HPI and ROS, the complete ROS is negative.    The following portions of the patient's history were reviewed with the patient and updated as appropriate: problem list, current medications, allergies, past medical history, past surgical history, past social history and past family history.  Reviewed overdue health maintenance topics with patient.    OBJECTIVE:  There were no vitals taken for this visit.  Weight is done 40 pounds. Walking in woods now.  Eyes clear, speech is appropriate  Breathing easily.  Neuro: grossly normal  Mood is upbeat.      IMPRESSION and PLAN:  Rodrecus Belsky presents with:    (G25.81) Restless legs syndrome  (primary encounter diagnosis)  Plan: pt has requested to reduce medications which may be specifically impacting his leg cramps that occur several x at night.  Amlodipine d/c  and stay on losartan  see b/p below  Continue to increase activity, stretching and weight loss.    (I10) Primary hypertension  Plan: contine losartan and check b/p 3 x per week. If elevated will increase the losartan as needed.  Avoid diuretics.     (E78.1) Hypertriglyceridemia  Plan: monitor in 3 months. D/c statin per patients request.  Recheck in 3 months.  Recheck cholesterol in three months    Pt understands the above plan of care with all questions answered to their satisfaction. Home instructions discussed.  AVS provided either by ecare or printed.    Discussed any prescribed medication dosage, usage, side effects, & therapy goals. Patient understands & is agreeable to the plan of care. Patient encouraged to read the handouts from pharmacy regarding medications.   Dictation with Animal nutritionist and may include misspellings    --  [name], PhD, ARNP  Family Medicine  [facility]  Phone #[phone]  Fax # [phone]

## 2022-06-19 NOTE — Progress Notes (Signed)
Health Maintenance Due   Topic    Hepatitis B Vaccine (1 of 3 - 3-dose series)    Diabetes Eye Exam     Depression Screening (PHQ-2)     COVID-19 Vaccine (3 - Moderna risk series)    Influenza Vaccine (1)       WAIIS/Care Everywhere/Mindscape have been reconciled in Epic: YES  HM DUE :   Hepatitis B Vaccine(1 of 3 - 3-dose series)  Diabetes Eye Exam  Depression Screening (PHQ-2)  COVID-19 Vaccine(3 - Moderna risk series)  Influenza Vaccine(1)  Shelva Majestic, CMA, CMA  06/18/2022    OUTREACH:

## 2022-07-17 ENCOUNTER — Ambulatory Visit (INDEPENDENT_AMBULATORY_CARE_PROVIDER_SITE_OTHER): Payer: BLUE CROSS/BLUE SHIELD | Admitting: Urology

## 2022-08-06 LAB — OTHER LAB ORDER
Hemoglobin A1C: 6.4 % — ABNORMAL HIGH (ref 4.8–5.6)
Mean Blood Glucose Estimate: 137 mg/dL

## 2023-02-21 ENCOUNTER — Telehealth (INDEPENDENT_AMBULATORY_CARE_PROVIDER_SITE_OTHER): Payer: BLUE CROSS/BLUE SHIELD | Admitting: Family

## 2023-02-21 DIAGNOSIS — M7501 Adhesive capsulitis of right shoulder: Secondary | ICD-10-CM

## 2023-02-21 DIAGNOSIS — S4991XA Unspecified injury of right shoulder and upper arm, initial encounter: Secondary | ICD-10-CM

## 2023-02-21 NOTE — Progress Notes (Signed)
 Distant Site Telemedicine Encounter      I conducted this encounter via secure, live, face-to-face video conference with the patient. I reviewed the risks and benefits of telemedicine as pertinent to this visit and the patient agreed to proceed.     Provider Location: Off-site location (home, non-[place] location)  Patient Location: At home  Present with patient: No one else present            [facility] FAMILY MEDICINE OUTPATIENT VISIT       Barok Anthony is scheduled today to discuss the following chief complaint(s)    HPI: Right Shoulder pain after falling in bathtub while taking a shower in April. 2024.  Didn't think much of it at the time, but now it is really bothersome.  Feels like it is shifting or moving in the joint. Hurts to sleep on it, can't move in a variety of positions especially internal rotation,Has numbness and tingling in his right  upper extremities.  Tingling goes down to 4 and 5 fingers. He can't work out at [employer] now with the right arm/shoulder. Denies any cervical pain.  He stopped going to the gym    Mole on the right forearm.  Finds this is growing in size. May want to have this biopsied.     RJJ:OACZYS for what is documented in HPI and ROS, the complete ROS is negative.    The following portions of the patient's history were reviewed with the patient and updated as appropriate: problem list, current medications, allergies, past medical history, past surgical history, past social history and past family history.  Reviewed overdue health maintenance topics with patient.    OBJECTIVE:  There were no vitals taken for this visit.  He is pleasant, good historian. No distress  Right shoulder is lower than left. Neck has full range of motion.  Can palpate right bursa anteriorly and has trigger point  Right arm can only be raised to about 120 degrees max.  Unable to cross body with thumb down without significant pain.  Unable to internal or external rotate shoulder without pain.   Does  not appear to have weakness in right arm.     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 Demonstrated home exercises to consider. Can take naproxen 220 to 440 mg twice daily with food for 7 to 10 days.  Ice 20 minutes on 2-3 x per day.     The above plan of care with all questions answered to their satisfaction. Home instructions discussed.  AVS provided either by ecare or printed.    Discussed any prescribed medication dosage, usage, side effects, & therapy goals. Patient understands & is agreeable to the plan of care. Patient encouraged to read the handouts from pharmacy regarding medications.   Dictation with Animal nutritionist and may include misspellings    --  [name], PhD, ARNP  Family Medicine  [facility]  Phone #[phone]  Fax # [phone]

## 2023-02-27 ENCOUNTER — Telehealth (INDEPENDENT_AMBULATORY_CARE_PROVIDER_SITE_OTHER): Payer: Self-pay | Admitting: Family

## 2023-02-27 DIAGNOSIS — S99911A Unspecified injury of right ankle, initial encounter: Secondary | ICD-10-CM

## 2023-02-27 NOTE — Telephone Encounter (Signed)
Xray for right shoulder placed, required prior to [place]. Please fax order to [facility] imaging.

## 2023-02-27 NOTE — Telephone Encounter (Signed)
Faxed. Received confirmation closing te

## 2023-02-27 NOTE — Telephone Encounter (Signed)
[facility] Joselyn care coordinator called to req XR for R shoulder in order to then perform Korea    Please place order    Fax [phone]

## 2023-03-09 ENCOUNTER — Encounter (INDEPENDENT_AMBULATORY_CARE_PROVIDER_SITE_OTHER): Payer: Self-pay | Admitting: Family

## 2023-03-10 ENCOUNTER — Inpatient Hospital Stay (INDEPENDENT_AMBULATORY_CARE_PROVIDER_SITE_OTHER)
Admit: 2023-03-10 | Discharge: 2023-03-10 | Disposition: A | Discharge status: Home / Self Care | Payer: BLUE CROSS/BLUE SHIELD

## 2023-03-10 ENCOUNTER — Encounter (INDEPENDENT_AMBULATORY_CARE_PROVIDER_SITE_OTHER): Payer: Self-pay | Admitting: Family

## 2023-03-10 ENCOUNTER — Ambulatory Visit (INDEPENDENT_AMBULATORY_CARE_PROVIDER_SITE_OTHER): Payer: BLUE CROSS/BLUE SHIELD | Admitting: Family

## 2023-03-10 VITALS — BP 140/87 | HR 91 | Temp 98.8°F | Ht 70.0 in | Wt 227.2 lb

## 2023-03-10 DIAGNOSIS — L819 Disorder of pigmentation, unspecified: Secondary | ICD-10-CM

## 2023-03-10 DIAGNOSIS — S4991XD Unspecified injury of right shoulder and upper arm, subsequent encounter: Secondary | ICD-10-CM

## 2023-03-10 DIAGNOSIS — G4701 Insomnia due to medical condition: Secondary | ICD-10-CM

## 2023-03-10 DIAGNOSIS — L818 Other specified disorders of pigmentation: Secondary | ICD-10-CM

## 2023-03-10 DIAGNOSIS — S4991XA Unspecified injury of right shoulder and upper arm, initial encounter: Secondary | ICD-10-CM

## 2023-03-10 MED ORDER — TRAZODONE HCL 50 MG OR TABS
ORAL_TABLET | ORAL | 2 refills | Status: DC
Start: 2023-03-10 — End: 2023-06-03

## 2023-03-10 NOTE — Progress Notes (Signed)
[facility] FAMILY MEDICINE OUTPATIENT VISIT       Anthony Andrade is scheduled today to discuss the following chief complaint(s)    HPI:  1) Pain in right shoulder is getting worse. Fell in bathroom a few weeks ago. He is unable to move shoulder higher than his clavicle. Hurts to palpate the front of the shoulder and interfering with sleep and activities of daily living. He rates pain as high. Needs xray before ultrasound can be completed.     2) Insomnia. Patient is under great deal of pressure. He is on strike at [employer]. Shoulder injury is interfering with sleep. He has been waking up after only a few hours of sleep. Fatigue high and more irritable. Would like help for a few weeks. Can't work out which usually is helpful.     3) Mole on right arm. Has been changing with shape now irregular. No prior hx of skin cancer.  Biopsy was scheduled and recommended but feels shoulder is also equally of concern.     QIH:KVQQVZ for what is documented in HPI and ROS, the complete ROS is negative.    The following portions of the patient's history were reviewed with the patient and updated as appropriate: problem list, current medications, allergies, past medical history, past surgical history, past social history and past family history.  Reviewed overdue health maintenance topics with patient.    Outpatient Medications Prior to Visit   Medication Sig Dispense Refill    albuterol HFA 108 (90 Base) MCG/ACT inhaler Inhale 1-2 puffs by mouth every 4 hours as needed for shortness of breath/wheezing. May substitute any brand that's covered. 18 g 1    ASPIRIN 81 OR       azelastine 0.1 % nasal spray Spray 1-2 sprays into each nostril 2 times a day. 30 mL 6    Blood Glucose Monitoring Suppl Kit Use to check blood sugar once daily 1 kit 0    Budesonide-Formoterol Fumarate 80-4.5 MCG/ACT Inhalation Aerosol Inhale 2 puffs by mouth every 12 hours. 1 Inhaler 3    Cimzia 2 Syringe 200 MG/ML prefilled syringe kit Inject 200 mg  under the skin.      clotrimazole-betamethasone 1-0.05 % cream Apply 1 application topically 2 times a day as needed (rash). Apply to buttocks 45 g 6    empagliflozin 10 MG tablet Take 1 tablet (10 mg) by mouth every morning.      esomeprazole 20 MG DR capsule Take 1 capsule (20 mg) by mouth 2 times a day.      fluticasone-salmeterol 250-50 MCG/DOSE diskus inhaler Inhale 1 puff by mouth 2 times a day. 60 each 3    galcanezumab-GNLM (Emgality) 120 MG/ML auto-injector inject 1 milliliter subcutaneously every month      Glucose Blood (BLOOD GLUCOSE TEST) In Vitro Strip Use 1 strip daily. Use to check blood sugar. 100 strip 1    Lancets Thin Misc Use 1 each daily. 100 each 1    Levothyroxine Sodium 88 MCG Oral Tab Take 1 tablet (88 mcg) by mouth daily on an empty stomach. 90 tablet 1    Losartan Potassium 50 MG Oral Tab take 1 tablet by mouth twice a day (Patient not taking: Reported on 03/10/2023) 60 tablet 5    Meloxicam 15 MG Oral Tab Take 1 tablet (15 mg) by mouth daily. 90 tablet 2    MetFORMIN HCl ER 750 MG Oral TABLET SR 24 HR Take 1 tablet (750 mg) by mouth daily. 90 tablet  0    Montelukast Sodium 10 MG Oral Tab Take 1 tablet (10 mg) by mouth every evening. 90 tablet 3    pantoprazole 40 MG packet Take 1 packet (40 mg) by mouth.      Pregabalin 225 MG Oral Cap One tablet at dinner and one tablet at bedtime 60 capsule 3    Respiratory Therapy Supplies (NEBULIZER) Device Use with albuterol as needed every 4 hours. NEEDS PORTABLE with battery 1 each 0    rimegepant (Nurtec) 75 MG disintegrating tablet Dissolve 1 tablet (75 mg) on top of tongue and swallow one time as needed for migraines. Do not take more than 1 dose in 24 hours.      rOPINIRole 4 MG tablet Take 1 tablet (4 mg) by mouth.      spironolactone 25 MG tablet Take by mouth.      topiramate 100 MG tablet Take 1 tablet (100 mg) by mouth daily.      ubrogepant 100 MG tablet take 1 tablet by mouth every 2 hours if needed for migraines maximum daily dose of 2  (Patient not taking: Reported on 03/10/2023)       No facility-administered medications prior to visit.         Patient Active Problem List   Diagnosis    Depressive disorder, not elsewhere classified    Allergic rhinitis, cause unspecified    Ankylosing spondylitis (HCC)    Chronic pain    Hypertension    Hypothyroidism    Foot pain    Other acquired absence of organ    Other postprocedural status(V45.89)    Unspecified sleep apnea    Asthma    Cough    Allergic rhinitis due to allergen    Restless legs syndrome    Anterior corneal dystrophy    Sleep apnea    Acute vestibular neuritis    Hypertriglyceridemia    Tonic pupillary reaction of left eye    Type 2 diabetes mellitus without complication (HCC)    Benign paroxysmal positional vertigo due to bilateral vestibular disorder    Bronchitis    Disequilibrium    Displaced fracture of fifth metatarsal bone of left foot with routine healing    Hematuria         OBJECTIVE:  BP 140/87   Pulse 91   Temp 37.1 C (Temporal)   Ht 5\' 10"  (1.778 m)   Wt (!) 103.1 kg (227 lb 3.2 oz)   SpO2 98%   BMI 32.60 kg/m   Pt is calm, vital signs are stable but slightly elevated b/p and pulse. Alert.  Right shoulder: limited range of motion across chest by 50% to midline only and only raising to clavicle height. He has significant tenderness at the anterior bursa with mild swelling noted. No deformity although lower by 1/2 inch then left shoulder.    Right Forearm: there is a 0.5 cm gy 1.0 cm flat mole with margins resembling cauliflower, brown, black and red.  No edema.    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  Will follow up as needed in 3 weeks.    (S49.91XD) Injury of right shoulder, subsequent encounter  Plan: XR Shoulder 2+ View Right, US Extremity Muscle/        Joint/ Tendon/ Ligament Right, US Extremity  Muscle/ Joint/  Tendon/ Ligament Right        [facility] and [facility] is no [facility] [place] at [facility] specialty. Will go to [employer].     (L81.9) Atypical pigmented skin lesion  Plan: Pathology, Surgical        Completed shave  biopsy   - marked with pen oblong egg like area around lesion  - cleaned with alcohol, area local hair shaved  - administered 2% lidocaine with epi.   -- 15 inch scalpel was utilized to remove the area of concern. 1 cm by 1 cm   -- inspected wound  -- applied antibiotic ointment and covered with 2x2 gauze dressing bandage and wrap.  Instructions provided on AVS    Immunizations were ordered per patient agreement. Patient was reminded of preventative care measures, scheduled or completed when appropriate.    Pt understands the above plan of care with all questions answered to their satisfaction. Home instructions discussed.  AVS provided either by ecare or printed.    Discussed any prescribed medication dosage, usage, side effects, & therapy goals. Patient understands & is agreeable to the plan of care. Patient encouraged to read the handouts from pharmacy regarding medications.   Dictation with Animal nutritionist and may include misspellings    --  [name], PhD, ARNP  Family Medicine  [facility]  Phone #[phone]  Fax # [phone]

## 2023-03-10 NOTE — Patient Instructions (Signed)
It was a pleasure to see you in clinic today.               Keep area clean and dry.   Remove bandaide in 24 hrs and inspect for redness, heat or swelling. If present, let me know.  Otherwise, clean with alcohol and apply small amount of antibiotic ointment twice daily for 5 days then apply bandaide.    Your Test Results:  If labs were ordered today the results are expected to be available via MyChart in about 5 days. If you have an active MyChart account, this is how we will notify you of your results.     If you do not have an MyChart account then your test results will be mailed to you within about 14 days after your tests are completed. If your physician needs to change your care based on your results or is concerned, you should expect a phone call or MyChart message from your provider.    If you have any questions about your test results please schedule an appointment with your provider, so that we may review them with you in greater detail.    **If it has been more than 2 weeks and you have not received your test results please call or send our office a message via MyChart.    Medication Refills:   If you need a prescription refilled, please contact your pharmacy 1 week before your current supply will run out to request the refill.  Contacting your pharmacy is the fastest and safest way to obtain a medication refill.  The pharmacy will notify our office.  Please note, that a minimum of 48 to 72 hours is needed to refill a medication,  Please call your pharmacy early to allow enough time to refill before you anticipate running out.  For faster medication refills, you can also schedule an appointment with your provider.    Controlled Substance Prescription Refills:  If you have been prescribed a controlled substance, in accordance with your signed agreement, you will need to see your provider every 1-3 months to obtain additional refills.    If you have been asked to leave a urine sample today, your provider  will determine whether the sample is processed.  If it is processed -  you will see two charges from Lab Billing - one for the Urine Test and one for the Lab Pathology Consultation. Please ask your provider for the CPT billing codes if you would like to confirm coverage with your insurance company.    If you fail to keep your future scheduled appointments, your prescription will not be filled until you are seen in the clinic.  For safety purposes, due to the high risk nature of these medications, we are not able to refill these without a scheduled appointment for assessment.  A copy of your signed agreement can be made available to you upon request.      You are still due for the following Health Maintenance measures:    Health Maintenance Due   Topic    Statin Therapy     Diabetes Kidney Health Evaluation     Hepatitis B Vaccine (1 of 3 - 19+ 3-dose series)    Diabetes Eye Exam     Depression Screening (PHQ-2)     COVID-19 Vaccine (3 - Moderna risk series)    Diabetes Foot Exam     Diabetes A1c        Plan to discuss at your next  appointment. If you've completed any of these outside of the [facility], please let your PCP know.    We know you have a choice in where you receive your healthcare and we sincerely thank you for trusting [facility] with your health.

## 2023-03-11 ENCOUNTER — Other Ambulatory Visit: Payer: BLUE CROSS/BLUE SHIELD | Admitting: Family

## 2023-03-11 NOTE — Addendum Note (Signed)
Addended by: Evert Kohl R on: 03/11/2023 01:32 PM     Modules accepted: Orders

## 2023-03-12 ENCOUNTER — Ambulatory Visit (HOSPITAL_BASED_OUTPATIENT_CLINIC_OR_DEPARTMENT_OTHER): Payer: BLUE CROSS/BLUE SHIELD | Admitting: Family

## 2023-03-12 ENCOUNTER — Ambulatory Visit
Admission: RE | Admit: 2023-03-12 | Discharge: 2023-03-12 | Disposition: A | Discharge status: Home / Self Care | Payer: BLUE CROSS/BLUE SHIELD | Attending: Diagnostic Radiology | Admitting: Diagnostic Radiology

## 2023-03-12 DIAGNOSIS — L821 Other seborrheic keratosis: Secondary | ICD-10-CM

## 2023-03-12 DIAGNOSIS — S4991XD Unspecified injury of right shoulder and upper arm, subsequent encounter: Secondary | ICD-10-CM | POA: Insufficient documentation

## 2023-03-12 NOTE — Addendum Note (Signed)
Addended byLise Auer on: 03/12/2023 10:46 AM     Modules accepted: Orders

## 2023-03-12 NOTE — Result Encounter Note (Signed)
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 Seeing a shoulder specialist and consider treatment is the next step.       All other results are normal.    [name], ARNP, PhD

## 2023-03-13 LAB — PATHOLOGY, SURGICAL

## 2023-03-15 ENCOUNTER — Encounter (INDEPENDENT_AMBULATORY_CARE_PROVIDER_SITE_OTHER): Payer: BLUE CROSS/BLUE SHIELD | Admitting: Family

## 2023-03-15 DIAGNOSIS — S46811A Strain of other muscles, fascia and tendons at shoulder and upper arm level, right arm, initial encounter: Secondary | ICD-10-CM

## 2023-03-15 NOTE — Result Encounter Note (Signed)
Your biopsy results are normal.   Please let me know if you have any question.

## 2023-03-17 DIAGNOSIS — S46811A Strain of other muscles, fascia and tendons at shoulder and upper arm level, right arm, initial encounter: Secondary | ICD-10-CM

## 2023-03-17 NOTE — Telephone Encounter (Signed)
Pt fell,   [place] ordered and noted to have Positive finding for Tendon tear of right shoulder.    REferred to Dr. Cindi Carbon, MD  [email or web address]  [address], [place], [place] [address]  7.6 mi  [phone]

## 2023-04-07 ENCOUNTER — Other Ambulatory Visit: Payer: Self-pay

## 2023-05-09 ENCOUNTER — Encounter (INDEPENDENT_AMBULATORY_CARE_PROVIDER_SITE_OTHER): Payer: Self-pay | Admitting: Family

## 2023-05-09 DIAGNOSIS — G43709 Chronic migraine without aura, not intractable, without status migrainosus: Secondary | ICD-10-CM

## 2023-05-10 MED ORDER — HYDROCODONE-ACETAMINOPHEN 5-325 MG OR TABS
ORAL_TABLET | ORAL | 0 refills | Status: DC
Start: 2023-05-10 — End: 2023-06-03

## 2023-05-10 MED ORDER — TIZANIDINE HCL 4 MG OR TABS
4.0000 mg | ORAL_TABLET | Freq: Three times a day (TID) | ORAL | 0 refills | Status: DC | PRN
Start: 2023-05-10 — End: 2023-06-03

## 2023-05-10 MED ORDER — ALMOTRIPTAN MALATE 12.5 MG OR TABS
ORAL_TABLET | ORAL | 2 refills | Status: AC
Start: 2023-05-10 — End: ?

## 2023-05-10 NOTE — Telephone Encounter (Signed)
 Pt reported severe migraines. No insurance due to [employer] and missed his botox injection.     Sent medications to the pharmacy.    [name], Blanchard Kelch, PhD

## 2023-05-28 ENCOUNTER — Encounter (INDEPENDENT_AMBULATORY_CARE_PROVIDER_SITE_OTHER): Payer: BLUE CROSS/BLUE SHIELD | Admitting: Family

## 2023-06-03 ENCOUNTER — Ambulatory Visit (INDEPENDENT_AMBULATORY_CARE_PROVIDER_SITE_OTHER): Payer: BLUE CROSS/BLUE SHIELD | Admitting: Family

## 2023-06-03 DIAGNOSIS — G4701 Insomnia due to medical condition: Secondary | ICD-10-CM

## 2023-06-03 DIAGNOSIS — G43709 Chronic migraine without aura, not intractable, without status migrainosus: Secondary | ICD-10-CM

## 2023-06-03 MED ORDER — TRAZODONE HCL 50 MG OR TABS
ORAL_TABLET | ORAL | 3 refills | Status: DC
Start: 2023-06-03 — End: 2023-10-28

## 2023-06-03 MED ORDER — TIZANIDINE HCL 4 MG OR TABS
4.0000 mg | ORAL_TABLET | Freq: Three times a day (TID) | ORAL | 3 refills | Status: DC | PRN
Start: 2023-06-03 — End: 2023-10-13

## 2023-06-03 NOTE — Progress Notes (Signed)
 Health Maintenance Due   Topic    Statin Therapy     Diabetes Kidney Health Evaluation     Hepatitis B Vaccine (1 of 3 - 19+ 3-dose series)    Diabetes Eye Exam     Depression Screening (PHQ-2)     Diabetes Foot Exam     COVID-19 Vaccine (3 - 2024-25 season)    Influenza Vaccine (1)       WAIIS/Care Everywhere/Mindscape have been reconciled in Epic: YES  HM DUE :   Statin Therapy  Diabetes Kidney Health Evaluation  Hepatitis B Vaccine(1 of 3 - 19+ 3-dose series)  Diabetes Eye Exam  Depression Screening (PHQ-2)  Diabetes Foot Exam  Diabetes A1c  COVID-19 Vaccine(3 - 2024-25 season)  Influenza Vaccine(1)  Shelva Majestic, CMA, [facility]  06/02/2023    OUTREACH:

## 2023-06-03 NOTE — Progress Notes (Signed)
 [facility]INE OUTPATIENT VISIT       Anthony Andrade is scheduled today to discuss the following chief complaint(s)    HPI: Struggling with sleep. Always tired   Sleep is disrupted due to restless legs.   - B12 was low  - takes tizanidine , trazodone , 3 fingers of burbon, roprinolol for restless legs,    Before he started burbon he was cramping  -work up for thyroid  and adrenal in process.   - he is sleeping 5 hrs or so at night. Will be getting up at 4 or so for 12 hour shifts soon.   - going to the gym but had to stop for awhile  -he does snore  - he also has shoulder pain  - he does take magnesium  at night and potassium    Off coffee by 9 am.  Does have sleep apnea and can't tolerate the cPAP and dries out his eye.  He d/c statin thinking that might help but it did not help him.  Magnesium  Glycinate is what he is taking.   Drinks 3 fingers of a hard liquor nightly  Mom has muscle cramping as well. Always wais down    ROS:Except for what is documented in HPI and ROS, the complete ROS is negative.    The following portions of the patient's history were reviewed with the patient and updated as appropriate: problem list, current medications, allergies, past medical history, past surgical history, past social history and past family history.  Reviewed overdue health maintenance topics with patient.  Outpatient Medications Prior to Visit   Medication Sig Dispense Refill    albuterol  HFA 108 (90 Base) MCG/ACT inhaler Inhale 1-2 puffs by mouth every 4 hours as needed for shortness of breath/wheezing. May substitute any brand that's covered. 18 g 1    almotriptan  12.5 MG tablet Take one tablet now and May repeat dose once in 2 hours if no relief. 2 tablet 2    ASPIRIN 81 OR       azelastine  0.1 % nasal spray Spray 1-2 sprays into each nostril 2 times a day. 30 mL 6    Blood Glucose Monitoring Suppl Kit Use to check blood sugar once daily 1 kit 0    Budesonide -Formoterol  Fumarate 80-4.5 MCG/ACT  Inhalation Aerosol Inhale 2 puffs by mouth every 12 hours. 1 Inhaler 3    Cimzia 2 Syringe 200 MG/ML prefilled syringe kit Inject 200 mg under the skin.      clotrimazole -betamethasone  1-0.05 % cream Apply 1 application topically 2 times a day as needed (rash). Apply to buttocks 45 g 6    empagliflozin 10 MG tablet Take 1 tablet (10 mg) by mouth every morning.      esomeprazole  20 MG DR capsule Take 1 capsule (20 mg) by mouth 2 times a day.      fluticasone -salmeterol 250-50 MCG/DOSE diskus inhaler Inhale 1 puff by mouth 2 times a day. 60 each 3    galcanezumab-GNLM (Emgality) 120 MG/ML auto-injector inject 1 milliliter subcutaneously every month      Glucose Blood (BLOOD GLUCOSE TEST) In Vitro Strip Use 1 strip daily. Use to check blood sugar. 100 strip 1    HYDROcodone -acetaminophen  5-325 MG tablet Two tablets first dose, then one to two tablet every 6 hrs as needed for 3 days only. 24 tablet 0    Lancets Thin Misc Use 1 each daily. 100 each 1    Levothyroxine  Sodium 88 MCG Oral Tab Take 1 tablet (88  mcg) by mouth daily on an empty stomach. 90 tablet 1    Losartan  Potassium 50 MG Oral Tab take 1 tablet by mouth twice a day 60 tablet 5    Meloxicam  15 MG Oral Tab Take 1 tablet (15 mg) by mouth daily. 90 tablet 2    MetFORMIN  HCl ER 750 MG Oral TABLET SR 24 HR Take 1 tablet (750 mg) by mouth daily. 90 tablet 0    Montelukast  Sodium 10 MG Oral Tab Take 1 tablet (10 mg) by mouth every evening. 90 tablet 3    pantoprazole  40 MG packet Take 1 packet (40 mg) by mouth.      Pregabalin  225 MG Oral Cap One tablet at dinner and one tablet at bedtime 60 capsule 3    Respiratory Therapy Supplies (NEBULIZER) Device Use with albuterol  as needed every 4 hours. NEEDS PORTABLE with battery 1 each 0    rimegepant (Nurtec) 75 MG disintegrating tablet Dissolve 1 tablet (75 mg) on top of tongue and swallow one time as needed for migraines. Do not take more than 1 dose in 24 hours.      rOPINIRole  4 MG tablet Take 1 tablet (4 mg) by  mouth.      spironolactone 25 MG tablet Take by mouth.      tiZANidine  4 MG tablet Take 1 tablet (4 mg) by mouth every 8 hours as needed for muscle spasms. 30 tablet 0    topiramate 100 MG tablet Take 1 tablet (100 mg) by mouth daily.      traZODone  50 MG tablet Take one or two tabs every night 30 minutes before sleep as needed. 60 tablet 2    ubrogepant 100 MG tablet        No facility-administered medications prior to visit.       Patient Active Problem List   Diagnosis    Depressive disorder, not elsewhere classified    Allergic rhinitis, cause unspecified    Ankylosing spondylitis (HCC)    Chronic pain    Hypertension    Hypothyroidism    Foot pain    Other acquired absence of organ    Other postprocedural status(V45.89)    Unspecified sleep apnea    Asthma (HCC)    Cough    Allergic rhinitis due to allergen    Restless legs syndrome    Anterior corneal dystrophy    Sleep apnea    Acute vestibular neuritis    Hypertriglyceridemia    Tonic pupillary reaction of left eye    Type 2 diabetes mellitus without complication (HCC)    Benign paroxysmal positional vertigo due to bilateral vestibular disorder    Bronchitis    Disequilibrium    Displaced fracture of fifth metatarsal bone of left foot with routine healing    Hematuria       OBJECTIVE:  BP (!) 152/88   Pulse 76   Temp 37.1 C   Resp 14   Wt (!) 103.4 kg (228 lb)   SpO2 98%   BMI 32.71 kg/m   Alert, no distress. B/p slightly elevated.   Eyes clear, focused.   Breathing through his nose.  Neuro: grossly intact  Mental: well groomed, somewhat restless, mildly anxious. Affect congruent with mood.  Judgement - good. Memory appears to be intact.      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  Consider seeing sleep  psychologist    [phone]) Chronic migraine without aura without status migrainosus, not intractable  Plan: tiZANidine  4 MG tablet        Needed refill          Refills were reviewed and provided per patient's request. Immunizations were ordered per patient agreement. Patient was reminded of preventative care measures, scheduled or completed when appropriate.    Pt understands the above plan of care with all questions answered to their satisfaction. Home instructions discussed.  AVS provided either by ecare or printed.    Discussed any prescribed medication dosage, usage, side effects, & therapy goals. Patient understands & is agreeable to the plan of care. Patient encouraged to read the handouts from pharmacy regarding medications.   Dictation with [employer] and may include misspellings    --

## 2023-06-05 ENCOUNTER — Encounter (INDEPENDENT_AMBULATORY_CARE_PROVIDER_SITE_OTHER): Payer: Self-pay | Admitting: Family

## 2023-07-23 ENCOUNTER — Telehealth: Payer: BLUE CROSS/BLUE SHIELD | Admitting: Physician Assistant

## 2023-07-23 ENCOUNTER — Other Ambulatory Visit (INDEPENDENT_AMBULATORY_CARE_PROVIDER_SITE_OTHER): Payer: Self-pay | Admitting: Family

## 2023-07-23 ENCOUNTER — Inpatient Hospital Stay (INDEPENDENT_AMBULATORY_CARE_PROVIDER_SITE_OTHER)
Admit: 2023-07-23 | Discharge: 2023-07-23 | Disposition: A | Discharge status: Home / Self Care | Payer: BLUE CROSS/BLUE SHIELD | Source: Home / Self Care

## 2023-07-23 ENCOUNTER — Telehealth: Payer: Self-pay | Admitting: Physician Assistant

## 2023-07-23 DIAGNOSIS — R059 Cough, unspecified: Secondary | ICD-10-CM

## 2023-07-23 DIAGNOSIS — J45901 Unspecified asthma with (acute) exacerbation: Secondary | ICD-10-CM

## 2023-07-23 DIAGNOSIS — J309 Allergic rhinitis, unspecified: Secondary | ICD-10-CM

## 2023-07-23 DIAGNOSIS — R509 Fever, unspecified: Secondary | ICD-10-CM

## 2023-07-23 DIAGNOSIS — E119 Type 2 diabetes mellitus without complications: Secondary | ICD-10-CM

## 2023-07-23 DIAGNOSIS — D84821 Immunodeficiency due to drugs: Secondary | ICD-10-CM

## 2023-07-23 DIAGNOSIS — Z79899 Other long term (current) drug therapy: Secondary | ICD-10-CM

## 2023-07-23 MED ORDER — AEROCHAMBER MV MISC
0 refills | Status: AC
Start: 2023-07-23 — End: ?

## 2023-07-23 MED ORDER — PREDNISONE 20 MG OR TABS
20.0000 mg | ORAL_TABLET | Freq: Two times a day (BID) | ORAL | 0 refills | Status: AC
Start: 2023-07-23 — End: 2023-07-28

## 2023-07-23 MED ORDER — FLUTICASONE-SALMETEROL 250-50 MCG/ACT IN AEPB
1.0000 | INHALATION_SPRAY | Freq: Two times a day (BID) | RESPIRATORY_TRACT | 3 refills | Status: AC
Start: 2023-07-23 — End: 2024-01-19

## 2023-07-23 MED ORDER — IPRATROPIUM-ALBUTEROL 0.5-2.5 (3) MG/3ML IN SOLN
3.0000 mL | Freq: Four times a day (QID) | RESPIRATORY_TRACT | 0 refills | Status: DC | PRN
Start: 2023-07-23 — End: 2023-08-05

## 2023-07-23 MED ORDER — ALBUTEROL SULFATE HFA 108 (90 BASE) MCG/ACT IN AERS
1.0000 | INHALATION_SPRAY | RESPIRATORY_TRACT | 1 refills | Status: DC | PRN
Start: 2023-07-23 — End: 2023-09-12

## 2023-07-23 NOTE — Telephone Encounter (Signed)
-----   Message from [place] sent at 07/23/2023 10:19 AM PST -----  Regarding: Follow up items from [facility] telehealth visit  To Virtual Primary Care Clinical Staff Pool:     Please assist with scheduling the patient for the following items: Imaging - Xray    To be scheduled: Same Day from today's date    Patient's preferred clinic: [facility]        Thank you,  Gabriel Earing, PA-C

## 2023-07-23 NOTE — Telephone Encounter (Signed)
Pt scheduled

## 2023-07-23 NOTE — Patient Instructions (Signed)
There are limitations to this mode of care, but based on your presentation today, your symptoms seem consistent with asthma exacerbation in setting of viral URI/reactive airway.     These are my recommendations:    Start prednisone, as prescribed.   Use albuterol inhaler, 2 puffs, 4x per day. Use spacer.  Use steroid inhaler 2 puffs, twice daily. Use spacer.   Use nebulizer, when needed, as prescribed.   Continue antihistamine daily. Can take 2nd dose at bedtime if needed.  Breathing in steam during warm/hot showers can help.     Also consider:   Please drink 8-10 cups of fluid per day.  Consider using a humidifier, especially in your room at night while you sleep.   You can take over-the-counter Mucinex to decrease secretions.    Follow up in 3-5 days if not feeling significantly better, sooner if worse. Severe breathing concerns need urgent follow up at urgent care or ER.     Recommend in person follow up in 1-2 weeks, sooner if needed.

## 2023-07-23 NOTE — Addendum Note (Signed)
Addended by: Gabriel Earing on: 07/23/2023 03:59 PM     Modules accepted: Orders

## 2023-07-23 NOTE — Telephone Encounter (Signed)
Waiting on pts response to schedule.

## 2023-07-23 NOTE — Progress Notes (Addendum)
Service Date: 07/23/23    [facility]UAL [facility]    [place] Site Telemedicine Encounter  I conducted this encounter via secure, live, face-to-face video conference with the patient. I reviewed the risks and benefits of telemedicine as pertinent to this visit and the patient agreed to proceed.    Provider Location: Off-site location (home, non-[place] location)  Patient Location: At home  Present with patient: Wife    The patient's identification was verified by using two patient identifiers and the following: government photo ID on file in chart.       Anthony Andrade is a 58 year old male who presents for:    Chief Complaint:    Chief Complaint   Patient presents with    Cough     Coughing, difficulty breathing, heavy chest          Subjective:       Diagnosed with asthma a long time ago.     In December he went to urgent care and was told he had a head cold. He was put on doxycycline for "chest thing."   He has some inhalers around the house that he used and things improved a little.  Now it seems it's come back.  Works outside a lot. Comes home and is freezing cold and jumps in bath to warm up. Last night couldn't get warm, was shaking, coughing.   Feels like he had a fever last night. Woke up sweaty and taking covers off and on.   Has been coughing all night, coughing all day. Can hear wheezing.   Every time he coughs his rib cage and back hurt.   Last night took Delsym.   This AM took Mucinex, Benadryl, and Allegra-D, and Delsym. Most days he takes an antihistamine but he was off of it for awhile.   Dry cough. Feels like it's coming from his upper chest. Denies pnd.   Has seasonal allergies. No new congestion.   Is not currently taking any inhalers. Hasn't taken them in awhile. They are expired. Is out of nebulizer solution.   Called into work today.     Last week had root canal and is currently on a Z-pack. Has 2 pills left.     Objective:      Vitals:  There were no vitals taken for this visit.    General: nad  HEENT:  normocephalic/atraumatic, EOMI, sclerae clear  Pulmonary: speaking in full sentences, but coughing frequently - hacking quality, normal respiratory rate  Psych: mood and affect normal  Neuro: alert and oriented x 3       Assessment and Plan:    This is a 58 year old male who presents for       ICD-10-CM    1. Reactive airway disease with acute exacerbation, unspecified asthma severity, unspecified whether persistent (HCC)  J45.901 XR Chest 2 View     predniSONE 20 MG tablet     fluticasone-salmeterol 250-50 MCG/ACT diskus inhaler     ipratropium-albuterol 0.5-2.5 (3) MG/3ML nebulizer solution     albuterol HFA 108 (90 Base) MCG/ACT inhaler     Spacer/Aero-Holding Chambers (AeroChamber MV) miscellaneous      2. Cough with fever  R05.9 XR Chest 2 View    R50.9       3. Allergic rhinitis, cause unspecified  J30.9       4. Type 2 diabetes mellitus without complication, without long-term current use of insulin (HCC)  E11.9       5.  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 Eval for pna.   Pt is currently on a Z-pack.   Allergies and pharmacy confirmed.     Patient Instructions   There are limitations to this mode of care, but based on your presentation today, your symptoms seem consistent with asthma exacerbation in setting of viral URI/reactive airway.     These are my recommendations:    Start prednisone, as prescribed.   Use albuterol inhaler, 2 puffs, 4x per day. Use spacer.  Use steroid inhaler 2 puffs, twice daily. Use spacer.   Use nebulizer, when needed, as prescribed.   Continue antihistamine daily. Can take 2nd dose at bedtime if needed.  Breathing in steam during warm/hot showers can help.     Also consider:   Please drink 8-10 cups of fluid per day.  Consider using a humidifier, especially in your room at night while you sleep.   You can take over-the-counter Mucinex to decrease secretions.    Follow up in 3-5 days if not feeling significantly better, sooner if  worse. Severe breathing concerns need urgent follow up at urgent care or ER.     Recommend in person follow up in 1-2 weeks, sooner if needed.       Reviewed risks and benefits of treatment options. Diagnosis and treatment options have been explained to the patient including medications side effects. Patient advised to read the information that will be provided by their pharmacist and to call if questions.     Questions have been addressed. Patient verbalized understanding and agrees with the plan outlined. Patient will return to medical attention if symptoms persist or worsen. After visit summary, available on MyChart.   Patient/family understands and agrees with plan. Left in stable condition.         Please Note:  To patients reading this note: Please be advised that the primary purpose of this note is for me to communicate with myself and other members of your medical team. Standard sentence structure is not always used. Medical terminology and medical abbreviations may be used. Parts of this note may have been generated with voice dictation software. Therefore, there may be wrong word or sound alike substitution errors and typographical errors missed in proofreading. Attempts to correct the above have been made by the provider, but it is recommended that the chart be read carefully to recognize, using context, where the substitutions may have occurred.

## 2023-08-03 ENCOUNTER — Other Ambulatory Visit (INDEPENDENT_AMBULATORY_CARE_PROVIDER_SITE_OTHER): Payer: Self-pay | Admitting: Family

## 2023-08-03 DIAGNOSIS — J45901 Unspecified asthma with (acute) exacerbation: Secondary | ICD-10-CM

## 2023-08-05 MED ORDER — IPRATROPIUM-ALBUTEROL 0.5-2.5 (3) MG/3ML IN SOLN
3.0000 mL | Freq: Four times a day (QID) | RESPIRATORY_TRACT | 0 refills | Status: AC | PRN
Start: 2023-08-05 — End: ?

## 2023-08-06 ENCOUNTER — Encounter (INDEPENDENT_AMBULATORY_CARE_PROVIDER_SITE_OTHER): Payer: Self-pay

## 2023-09-10 ENCOUNTER — Other Ambulatory Visit (INDEPENDENT_AMBULATORY_CARE_PROVIDER_SITE_OTHER): Payer: Self-pay | Admitting: Family

## 2023-09-10 DIAGNOSIS — J45901 Unspecified asthma with (acute) exacerbation: Secondary | ICD-10-CM

## 2023-09-12 MED ORDER — ALBUTEROL SULFATE HFA 108 (90 BASE) MCG/ACT IN AERS
1.0000 | INHALATION_SPRAY | RESPIRATORY_TRACT | 1 refills | Status: AC | PRN
Start: 2023-09-12 — End: ?

## 2023-09-20 ENCOUNTER — Other Ambulatory Visit (INDEPENDENT_AMBULATORY_CARE_PROVIDER_SITE_OTHER): Payer: Self-pay | Admitting: Family

## 2023-09-20 DIAGNOSIS — G43709 Chronic migraine without aura, not intractable, without status migrainosus: Secondary | ICD-10-CM

## 2023-09-22 NOTE — Telephone Encounter (Signed)
 Tizanidine last prescribed 06/03/23 with 3 refills. Next pick up should be around 4/10. Prescribed for chronic migraines- but recent neurology note does note mention tizanidine as part of treatment plan. Per PCP's note 12/10 he takes it for restless legs.

## 2023-09-22 NOTE — Telephone Encounter (Signed)
 This medication is outside of the Refill Center's protocols.  If this medication is denied please have your staff inform the patient and schedule an appointment if necessary.

## 2023-09-23 NOTE — Telephone Encounter (Signed)
 Pt reported taking it one time a night before bed mostly for cramps as opposed to restless leg

## 2023-10-04 ENCOUNTER — Other Ambulatory Visit (INDEPENDENT_AMBULATORY_CARE_PROVIDER_SITE_OTHER): Payer: Self-pay | Admitting: Family

## 2023-10-04 DIAGNOSIS — G43709 Chronic migraine without aura, not intractable, without status migrainosus: Secondary | ICD-10-CM

## 2023-10-06 NOTE — Telephone Encounter (Signed)
 This medication is outside of the Refill Center's protocols.  If this medication is denied please have your staff inform the patient and schedule an appointment if necessary.

## 2023-10-07 NOTE — Telephone Encounter (Signed)
 Please ask patient to request refill from pharmacy. He was given a 1 year supply in December so he should have plenty of refills left

## 2023-10-07 NOTE — Telephone Encounter (Signed)
 tiZANidine HCl Oral Tablet 4 MG sent 12/10 for 1 year. Called pt NA and LM and sent Mychart

## 2023-10-08 ENCOUNTER — Other Ambulatory Visit (INDEPENDENT_AMBULATORY_CARE_PROVIDER_SITE_OTHER): Payer: Self-pay | Admitting: Family

## 2023-10-08 DIAGNOSIS — G43709 Chronic migraine without aura, not intractable, without status migrainosus: Secondary | ICD-10-CM

## 2023-10-09 NOTE — Telephone Encounter (Signed)
 This medication is outside of the Refill Center's protocols.  If this medication is denied please have your staff inform the patient and schedule an appointment if necessary.

## 2023-10-13 ENCOUNTER — Other Ambulatory Visit (INDEPENDENT_AMBULATORY_CARE_PROVIDER_SITE_OTHER): Payer: Self-pay | Admitting: Family

## 2023-10-13 DIAGNOSIS — G43709 Chronic migraine without aura, not intractable, without status migrainosus: Secondary | ICD-10-CM

## 2023-10-13 MED ORDER — TIZANIDINE HCL 4 MG OR TABS
4.0000 mg | ORAL_TABLET | Freq: Three times a day (TID) | ORAL | 3 refills | Status: DC | PRN
Start: 2023-10-13 — End: 2023-10-28

## 2023-10-13 NOTE — Addendum Note (Signed)
 Addended by: Brigitta Pricer JEANETTE on: 10/13/2023 01:11 PM     Modules accepted: Orders

## 2023-10-28 ENCOUNTER — Ambulatory Visit (INDEPENDENT_AMBULATORY_CARE_PROVIDER_SITE_OTHER): Payer: BLUE CROSS/BLUE SHIELD | Admitting: Family

## 2023-10-28 ENCOUNTER — Encounter (INDEPENDENT_AMBULATORY_CARE_PROVIDER_SITE_OTHER): Payer: Self-pay | Admitting: Family

## 2023-10-28 ENCOUNTER — Encounter (INDEPENDENT_AMBULATORY_CARE_PROVIDER_SITE_OTHER): Payer: Self-pay

## 2023-10-28 VITALS — BP 157/92 | HR 106 | Temp 98.9°F | Resp 14 | Wt 225.0 lb

## 2023-10-28 DIAGNOSIS — J301 Allergic rhinitis due to pollen: Secondary | ICD-10-CM

## 2023-10-28 DIAGNOSIS — G43709 Chronic migraine without aura, not intractable, without status migrainosus: Secondary | ICD-10-CM

## 2023-10-28 DIAGNOSIS — G4701 Insomnia due to medical condition: Secondary | ICD-10-CM

## 2023-10-28 DIAGNOSIS — J329 Chronic sinusitis, unspecified: Secondary | ICD-10-CM

## 2023-10-28 DIAGNOSIS — J309 Allergic rhinitis, unspecified: Secondary | ICD-10-CM

## 2023-10-28 DIAGNOSIS — H43391 Other vitreous opacities, right eye: Secondary | ICD-10-CM

## 2023-10-28 MED ORDER — DOXYCYCLINE MONOHYDRATE 100 MG OR CAPS
100.0000 mg | ORAL_CAPSULE | Freq: Two times a day (BID) | ORAL | 0 refills | Status: AC
Start: 2023-10-28 — End: 2023-11-07

## 2023-10-28 MED ORDER — AZELASTINE HCL 0.1 % NA SOLN
1.0000 | Freq: Two times a day (BID) | NASAL | 6 refills | Status: AC
Start: 2023-10-28 — End: ?

## 2023-10-28 MED ORDER — MONTELUKAST SODIUM 10 MG OR TABS
10.0000 mg | ORAL_TABLET | Freq: Every evening | ORAL | 0 refills | Status: DC
Start: 2023-10-28 — End: 2024-01-26

## 2023-10-28 MED ORDER — TRAZODONE HCL 50 MG OR TABS
ORAL_TABLET | ORAL | 3 refills | Status: AC
Start: 2023-10-28 — End: ?

## 2023-10-28 MED ORDER — TIZANIDINE HCL 4 MG OR TABS
ORAL_TABLET | ORAL | 1 refills | Status: DC
Start: 2023-10-28 — End: 2024-05-13

## 2023-10-28 NOTE — Progress Notes (Signed)
 Health Maintenance Due   Topic    Statin Therapy     Diabetes Kidney Health Evaluation     Hepatitis B Vaccine (1 of 3 - 19+ 3-dose series)    Diabetes Eye Exam     Depression Screening (PHQ-2)     COVID-19 Vaccine (3 - Moderna risk series)    Diabetes Foot Exam        WAIIS/Care Everywhere/[employer] have been reconciled in Epic: YES  HM DUE :   Statin Therapy  Diabetes Kidney Health Evaluation  Hepatitis B Vaccine(1 of 3 - 19+ 3-dose series)  Diabetes Eye Exam  Depression Screening (PHQ-2)  COVID-19 Vaccine(3 - Moderna risk series)  Diabetes Foot Exam  Geneva Kerbs, CMA, [facility]  10/25/2023    OUTREACH:

## 2023-10-28 NOTE — Progress Notes (Signed)
 CLINIC VISIT    CHIEF Complaint(s)  Anthony Andrade is a 58 year old Person here with chief complaint(s): sinus pain, allergies and refill of muscle relaxant  HPI:  Refill Tizanidine  for migraines,  for restless legs, general leg spasms and sleep support. Anthony Andrade has found the low dose Tizanidine  reduces the body and extremity aches and pains. Sleeping better with Tizanidine . If migraine does occur, Tizanidine  curbs the pain.  Wants to continue for now. Denies difficulty  breathing at night if he is sitting up.     Allergies: multiple environmental allergies. Cough this yr started in 12/24.   He has allergies and cough is worse at night. Sinus drainage at night  At night, he can barely breath at night due to current sinus congestion. He takes Allergra D or Claritin D. This helps but needs additional decongestant.   -does have intermittent fevers  -does have pain in left sinus with yellow drainage    Allergies-Morphine, Penicillins, and Bactrim  [sulfamethoxazole -trimethoprim ]  Patient Active Problem List   Diagnosis    Depressive disorder, not elsewhere classified    Allergic rhinitis, cause unspecified    Ankylosing spondylitis (HCC)    Chronic pain    Hypertension    Hypothyroidism    Foot pain    Other acquired absence of organ    Other postprocedural status(V45.89)    Unspecified sleep apnea    Asthma (HCC)    Cough    Allergic rhinitis due to allergen    Restless legs syndrome    Anterior corneal dystrophy    Sleep apnea    Acute vestibular neuritis    Hypertriglyceridemia    Tonic pupillary reaction of left eye    Type 2 diabetes mellitus without complication (HCC)    Benign paroxysmal positional vertigo due to bilateral vestibular disorder    Bronchitis    Disequilibrium    Displaced fracture of fifth metatarsal bone of left foot with routine healing    Hematuria     Current Outpatient Medications   Medication Sig Dispense Refill    albuterol  HFA (Ventolin  HFA) 108 (90 Base) MCG/ACT inhaler Inhale 1-2 puffs  by mouth every 4 hours as needed for shortness of breath/wheezing. 18 g 1    almotriptan  12.5 MG tablet Take one tablet now and May repeat dose once in 2 hours if no relief. 2 tablet 2    ASPIRIN 81 OR       azelastine  0.1 % nasal spray Spray 1-2 sprays into each nostril 2 times a day. 30 mL 6    Blood Glucose Monitoring Suppl Kit Use to check blood sugar once daily 1 kit 0    Cimzia 2 Syringe 200 MG/ML prefilled syringe kit Inject 200 mg under the skin.      clotrimazole -betamethasone  1-0.05 % cream Apply 1 application topically 2 times a day as needed (rash). Apply to buttocks 45 g 6    empagliflozin 10 MG tablet Take 1 tablet (10 mg) by mouth every morning.      esomeprazole  20 MG DR capsule Take 1 capsule (20 mg) by mouth 2 times a day.      fluticasone -salmeterol 250-50 MCG/ACT diskus inhaler Inhale 1 puff by mouth 2 times a day. 60 each 3    galcanezumab-GNLM (Emgality) 120 MG/ML auto-injector inject 1 milliliter subcutaneously every month      Glucose Blood (BLOOD GLUCOSE TEST) In Vitro Strip Use 1 strip daily. Use to check blood sugar. 100 strip 1    ipratropium-albuterol   0.5-2.5 (3) MG/3ML nebulizer solution Inhale 3 mL via nebulizer 4 times a day as needed for shortness of breath/wheezing. 90 mL 0    Lancets Thin Misc Use 1 each daily. 100 each 1    Levothyroxine  Sodium 88 MCG Oral Tab Take 1 tablet (88 mcg) by mouth daily on an empty stomach. 90 tablet 1    Losartan  Potassium 50 MG Oral Tab take 1 tablet by mouth twice a day 60 tablet 5    MetFORMIN  HCl ER 750 MG Oral TABLET SR 24 HR Take 1 tablet (750 mg) by mouth daily. 90 tablet 0    pantoprazole  40 MG packet Take 1 packet (40 mg) by mouth.      Pregabalin  225 MG Oral Cap One tablet at dinner and one tablet at bedtime 60 capsule 3    Respiratory Therapy Supplies (NEBULIZER) Device Use with albuterol  as needed every 4 hours. NEEDS PORTABLE with battery 1 each 0    rimegepant (Nurtec) 75 MG disintegrating tablet Dissolve 1 tablet (75 mg) on top of tongue  and swallow one time as needed for migraines. Do not take more than 1 dose in 24 hours.      rOPINIRole  4 MG tablet Take 1 tablet (4 mg) by mouth.      Spacer/Aero-Holding Chambers (AeroChamber MV) miscellaneous Use with inhaler as directed. Ok for pharmacist to substitute for another covered inhaler. Ok for pt to use w/o spacer if spacer is not covered. 1 each 0    spironolactone 25 MG tablet Take by mouth.      tiZANidine  4 MG tablet Take two tablets with between 4 and 6 pm for restless legs. 180 tablet 1    topiramate 100 MG tablet Take 1 tablet (100 mg) by mouth daily.      traZODone  50 MG tablet Take one or two tabs every night 30 minutes before sleep as needed. 180 tablet 3    ubrogepant 100 MG tablet        No current facility-administered medications for this visit.     ROS:Except for what is documented in HPI and ROS, the complete ROS is negative.    Past Medical History:   Diagnosis Date    Bronchitis 09/08/2017    Hypertriglyceridemia 04/06/2015    Asthma (HCC) 07/11/2013    Hypothyroidism 09/11/2012    Hypertension 02/16/2012    Chronic pain 02/16/2012    Sleep apnea 08/20/2010    Allergic rhinitis, cause unspecified 01/19/2009    Depressive disorder, not elsewhere classified 12/16/2008    Allergic rhinitis due to other allergen     Ankylosing spondylitis (HCC)     Esophageal reflux     Restless legs syndrome (RLS)     Unspecified sleep apnea      Past Surgical History:   Procedure Laterality Date    PR CHOLECYSTECTOMY      PR COLONOSCOPY STOMA DX INCLUDING COLLJ SPEC SPX  2008    normal per patient    PR ESOPHAGOGASTRODUODENOSCOPY TRANSORAL DIAGNOSTIC  2008    on prevacid    PR UNLISTED PROCEDURE SHOULDER      left shoulder     family history includes Diabetes in his mother; Heart Attack in his maternal grandfather; Hypertension in his father and mother; Pancreatic Cancer in his paternal grandfather.  Past Surgical History:   Procedure Laterality Date    PR CHOLECYSTECTOMY      PR COLONOSCOPY STOMA DX INCLUDING  COLLJ SPEC SPX  2008    normal  per patient    PR ESOPHAGOGASTRODUODENOSCOPY TRANSORAL DIAGNOSTIC  2008    on prevacid    PR UNLISTED PROCEDURE SHOULDER      left shoulder     Social History     Socioeconomic History    Marital status: Married     Spouse name: Not on file    Number of children: Not on file    Years of education: Not on file    Highest education level: Not on file   Occupational History    Not on file   Tobacco Use    Smoking status: Former     Current packs/day: 0.00     Average packs/day: 0.5 packs/day for 15.0 years (7.5 ttl pk-yrs)     Types: Cigarettes     Start date: 39     Quit date: 2008     Years since quitting: 17.3    Smokeless tobacco: Never    Tobacco comments:     No sure of the start date.    Substance and Sexual Activity    Alcohol use: Not Currently     Alcohol/week: 7.0 standard drinks of alcohol     Types: 7 Standard drinks or equivalent per week    Drug use: No    Sexual activity: Not on file   Other Topics Concern    Not on file   Social History Narrative    Recently moved from [place] .  Works for [employer].  One daughter.  Sister is an Charity fundraiser.         02/15/12    Lives with his wife.    Daughter is studious     Son has been diagnosed with mild asperger's syndrome.    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 No rashes or concerning lesions  Head: Normocephalic. No masses, lesions, tenderness or abnormalities  Eyes: Lids/periorbital skin normal, Conjunctivae/corneas clear, PERRL, EOM's intact  Ears: Both ear canals are pale with skin flakes. TMS  are dull and grayish  Nose: Nasal canals are excoriated. There is a subdural cyst in left nostril about 1/2 each from opening. Tender. Maxillary sinus left side is also tender.  Yellow discharge from nasal passages, thick.   Oropharynx:   Neck: supple. No adenopathy. Thyroid  symmetric, normal size, without nodules  Lungs: clear to auscultation  Heart: normal rate, regular rhythm and no murmurs, clicks, or gallops  Neuro CN II-XII intact and symmetric, strength and sensation to light touch intact and symmetric in all 4 extremities, DTRs 2+ and symmetric at biceps and patellar tendons.    Health Maintenance reviewed - reviewed, reminded and ordered.    ASSESSMENT AND PLAN    1. Chronic migraine without aura without status migrainosus, not intractable  - tiZANidine  4 MG tablet; Take two tablets with between 4 and 6 pm for restless legs.  Dispense: 180 tablet; Refill: 1    2.  Insomnia due to medical condition  - traZODone  50 MG tablet; Take one or two tabs every night 30 minutes before sleep as needed.  Dispense: 180 tablet; Refill: 3      (J30.9) Allergic rhinitis, cause unspecified  (primary encounter diagnosis)  Plan: montelukast  10 MG tablet          (G43.709) Chronic migraine without aura without status migrainosus, not intractable  Plan: tiZANidine  4 MG tablet          (G47.01) Insomnia due to medical condition  Plan: traZODone  50 MG tablet          (J30.1) Seasonal allergic rhinitis due to pollen  Plan: azelastine  0.1 % nasal spray          (J32.9) Recurrent sinusitis  Plan: doxycycline monohydrate 100 MG capsule        See in 14 days or sooner if needed   Send home blood pressure readings within 3 days.    Discussed any prescribed medication dosage, usage, side effects, & therapy goals. Patient understands & is agreeable to the plan of care. Patient encouraged to read the handouts from pharmacy regarding medications.   Dictation with Animal nutritionist and may include misspellings       Pt understands the above  plan of care with all questions answered to their satisfaction. Home instructions discussed.  AVS provided either by ecare or printed.  Discussed medication dosage, usage, side effects, & therapy goals. Patient understands & is agreeable to the plan of care. Patient encouraged to read the handouts from pharmacy regarding medications.   Dictation with [employer] and may include misspellings

## 2023-10-29 ENCOUNTER — Encounter (INDEPENDENT_AMBULATORY_CARE_PROVIDER_SITE_OTHER): Payer: Self-pay | Admitting: Family

## 2024-01-16 ENCOUNTER — Encounter (INDEPENDENT_AMBULATORY_CARE_PROVIDER_SITE_OTHER): Payer: Self-pay

## 2024-01-16 ENCOUNTER — Encounter (INDEPENDENT_AMBULATORY_CARE_PROVIDER_SITE_OTHER): Payer: Self-pay | Admitting: Family

## 2024-01-16 ENCOUNTER — Telehealth (INDEPENDENT_AMBULATORY_CARE_PROVIDER_SITE_OTHER): Payer: BLUE CROSS/BLUE SHIELD | Admitting: Family

## 2024-01-16 DIAGNOSIS — G47411 Narcolepsy with cataplexy: Secondary | ICD-10-CM | POA: Insufficient documentation

## 2024-01-16 DIAGNOSIS — I10 Essential (primary) hypertension: Secondary | ICD-10-CM

## 2024-01-16 DIAGNOSIS — J4532 Mild persistent asthma with status asthmaticus: Secondary | ICD-10-CM | POA: Insufficient documentation

## 2024-01-16 DIAGNOSIS — M792 Neuralgia and neuritis, unspecified: Secondary | ICD-10-CM

## 2024-01-16 DIAGNOSIS — R252 Cramp and spasm: Secondary | ICD-10-CM

## 2024-01-16 DIAGNOSIS — E039 Hypothyroidism, unspecified: Secondary | ICD-10-CM

## 2024-01-16 DIAGNOSIS — M450 Ankylosing spondylitis of multiple sites in spine: Secondary | ICD-10-CM

## 2024-01-16 DIAGNOSIS — E119 Type 2 diabetes mellitus without complications: Secondary | ICD-10-CM

## 2024-01-16 NOTE — Progress Notes (Signed)
 Distant Site Telemedicine Encounter:    Distant Site Telemedicine Encounter  I conducted this encounter via secure, live, face-to-face video conference with the patient. I reviewed the risks and benefits of telemedicine as pertinent to this visit and the patient agreed to proceed.    Provider Location: On-site location (clinic, hospital, on-site office)  Patient Location: At home    Present with patient: No one else present       [facility] FAMILY MEDICINE OUTPATIENT VISIT       Shelley Pooley is scheduled today to discuss the following chief complaint(s)    YEP:Mzqzmmjod  1 Kregg is changing clinics due to difficulties with access. He works in [place] and will receive all specialty care in [place].  He has a complex hx of inflammatory conditions including AK,hx of diabetes type 2, thyroiditis/hypothyroid, and asthma. He also has neuropathy  and  muscle cramps in legs.   Requesting referrals for endocrinology, rheumatology and naturopathic medicine.   Needs referral     Diabetic thyroid  endo clinic at [facility] [phone])     Rheumatologist: Powell Valeri ROLLA Annett (fax) [facility]    Dr. Renella, [place] in [place].     ROS:Except for what is documented in HPI and ROS, the complete ROS is negative.     The following portions of the patient's history were reviewed with the patient and updated as appropriate: problem list, current medications, allergies, past medical history, past surgical history, past social history and past family history.  Reviewed overdue health maintenance topics with patient.  Outpatient Medications Prior to Visit   Medication Sig Dispense Refill    albuterol  HFA (Ventolin  HFA) 108 (90 Base) MCG/ACT inhaler Inhale 1-2 puffs by mouth every 4 hours as needed for shortness of breath/wheezing. 18 g 1    almotriptan  12.5 MG tablet Take one tablet now and May repeat dose once in 2 hours if no relief. 2 tablet 2    ASPIRIN 81 OR       azelastine  0.1 % nasal spray Spray  1-2 sprays into each nostril 2 times a day. 30 mL 6    Blood Glucose Monitoring Suppl Kit Use to check blood sugar once daily 1 kit 0    Cimzia 2 Syringe 200 MG/ML prefilled syringe kit Inject 200 mg under the skin.      clotrimazole -betamethasone  1-0.05 % cream Apply 1 application topically 2 times a day as needed (rash). Apply to buttocks 45 g 6    empagliflozin 10 MG tablet Take 1 tablet (10 mg) by mouth every morning.      esomeprazole  20 MG DR capsule Take 1 capsule (20 mg) by mouth 2 times a day.      fluticasone -salmeterol 250-50 MCG/ACT diskus inhaler Inhale 1 puff by mouth 2 times a day. 60 each 3    galcanezumab-GNLM (Emgality) 120 MG/ML auto-injector inject 1 milliliter subcutaneously every month      Glucose Blood (BLOOD GLUCOSE TEST) In Vitro Strip Use 1 strip daily. Use to check blood sugar. 100 strip 1    ipratropium-albuterol  0.5-2.5 (3) MG/3ML nebulizer solution Inhale 3 mL via nebulizer 4 times a day as needed for shortness of breath/wheezing. 90 mL 0    Lancets Thin Misc Use 1 each daily. 100 each 1    Levothyroxine  Sodium 88 MCG Oral Tab Take 1 tablet (88 mcg) by mouth daily on an empty stomach. 90 tablet 1    Losartan  Potassium 50 MG Oral Tab take 1 tablet  by mouth twice a day 60 tablet 5    MetFORMIN  HCl ER 750 MG Oral TABLET SR 24 HR Take 1 tablet (750 mg) by mouth daily. 90 tablet 0    montelukast  10 MG tablet Take 1 tablet (10 mg) by mouth every evening. 90 tablet 0    pantoprazole  40 MG packet Take 1 packet (40 mg) by mouth.      Pregabalin  225 MG Oral Cap One tablet at dinner and one tablet at bedtime 60 capsule 3    Respiratory Therapy Supplies (NEBULIZER) Device Use with albuterol  as needed every 4 hours. NEEDS PORTABLE with battery 1 each 0    rimegepant (Nurtec) 75 MG disintegrating tablet Dissolve 1 tablet (75 mg) on top of tongue and swallow one time as needed for migraines. Do not take more than 1 dose in 24 hours.      rOPINIRole  4 MG tablet Take 1 tablet (4 mg) by mouth.       Spacer/Aero-Holding Chambers (AeroChamber MV) miscellaneous Use with inhaler as directed. Ok for pharmacist to substitute for another covered inhaler. Ok for pt to use w/o spacer if spacer is not covered. 1 each 0    spironolactone 25 MG tablet Take by mouth.      tiZANidine  4 MG tablet Take two tablets with between 4 and 6 pm for restless legs. 180 tablet 1    topiramate 100 MG tablet Take 1 tablet (100 mg) by mouth daily.      traZODone  50 MG tablet Take one or two tabs every night 30 minutes before sleep as needed. 180 tablet 3    ubrogepant 100 MG tablet        No facility-administered medications prior to visit.       Patient Active Problem List   Diagnosis    Depressive disorder, not elsewhere classified    Allergic rhinitis, cause unspecified    Ankylosing spondylitis (HCC)    Chronic pain    Hypertension    Hypothyroidism    Foot pain    Other acquired absence of organ    Other postprocedural status(V45.89)    Unspecified sleep apnea    Asthma (HCC)    Cough    Allergic rhinitis due to allergen    Restless legs syndrome    Anterior corneal dystrophy    Sleep apnea    Acute vestibular neuritis    Hypertriglyceridemia    Tonic pupillary reaction of left eye    Type 2 diabetes mellitus without complication (HCC)    Benign paroxysmal positional vertigo due to bilateral vestibular disorder    Bronchitis    Disequilibrium    Displaced fracture of fifth metatarsal bone of left foot with routine healing    Hematuria       OBJECTIVE:  There were no vitals taken for this visit.  Serious, alert, cooperative, no distress.  Eyes focused, attentive to conversation. Good eye contact  Voice is pleasant. Appropriate cadence.  Breathing easily  Neuro: grossly normal  Mental: affect and mood are congruent and serious expression.    IMPRESSION and PLAN:  Labrandon Knoch presents with:    (R25.2) Leg cramps  (primary encounter diagnosis)  Plan: Referral to Naturopath            (M79.2) Neuropathic pain  Plan: Referral to  Naturopath          (I10) Primary hypertension  Plan: Referral to Endocrinology-Diabetes          (E03.9) Hypothyroidism, unspecified  type  Plan: Referral to Endocrinology-Diabetes            (E11.9) Type 2 diabetes mellitus without complication, without long-term current use of insulin (HCC)  Plan: Referral to Endocrinology-Diabetes            (M45.0) Ankylosing spondylitis of multiple sites in spine [facility])  Plan: Referral to Rheumatology            Pt declined any additional services for other diagnosis at this time. He reports his diabetes and AK are stable.     Refills were reviewed and provided per patient's request. Immunizations were ordered per patient agreement. Patient was reminded of preventative care measures, scheduled or completed when appropriate.    Pt understands the above plan of care with all questions answered to their satisfaction. Home instructions discussed.  AVS provided either by ecare or printed.    Discussed any prescribed medication dosage, usage, side effects, & therapy goals. Patient understands & is agreeable to the plan of care. Patient encouraged to read the handouts from pharmacy regarding medications.   Dictation with [employer] and may include misspellings    --

## 2024-01-16 NOTE — Progress Notes (Signed)
 Health Maintenance Due   Topic    Statin Therapy     Diabetes Kidney Health Evaluation     Hepatitis B Vaccine (1 of 3 - 19+ 3-dose series)    Diabetes Eye Exam     Depression Screening (PHQ-2)     COVID-19 Vaccine (3 - Moderna risk series)    Diabetes Foot Exam     Diabetes A1c     Prostate Cancer Screening Education        [place]IIS/Care Everywhere/[employer] have been reconciled in Epic: YES  HM DUE :   Statin Therapy  Diabetes Kidney Health Evaluation  Hepatitis B Vaccine(1 of 3 - 19+ 3-dose series)  Diabetes Eye Exam  Depression Screening (PHQ-2)  COVID-19 Vaccine(3 - Moderna risk series)  Diabetes Foot Exam  Diabetes A1c  Prostate Cancer Screening Education  [place], CMA, CMA  01/15/2024    OUTREACH:

## 2024-01-21 ENCOUNTER — Encounter (INDEPENDENT_AMBULATORY_CARE_PROVIDER_SITE_OTHER): Payer: BLUE CROSS/BLUE SHIELD | Admitting: Family

## 2024-01-23 ENCOUNTER — Other Ambulatory Visit (INDEPENDENT_AMBULATORY_CARE_PROVIDER_SITE_OTHER): Payer: Self-pay | Admitting: Family

## 2024-01-23 DIAGNOSIS — J309 Allergic rhinitis, unspecified: Secondary | ICD-10-CM

## 2024-01-26 MED ORDER — MONTELUKAST SODIUM 10 MG OR TABS
10.0000 mg | ORAL_TABLET | Freq: Every evening | ORAL | 2 refills | Status: AC
Start: 2024-01-26 — End: ?

## 2024-04-29 ENCOUNTER — Encounter (INDEPENDENT_AMBULATORY_CARE_PROVIDER_SITE_OTHER): Payer: Self-pay

## 2024-05-07 ENCOUNTER — Encounter (INDEPENDENT_AMBULATORY_CARE_PROVIDER_SITE_OTHER): Payer: Self-pay | Admitting: Family

## 2024-05-10 NOTE — Telephone Encounter (Addendum)
 Outgoing to Patient  Care gaps Due: Return Wellness - Physical and Diabetes Care - Eye screening    Outreach: Phone Call to Patient    Outcome: LVM. Sent MyChart and scheduling ticket..    CCR / PSS - Please schedule the patient for the appropriate visit type for the Care Gap(s) listed above.    Closing encounter.

## 2024-05-12 ENCOUNTER — Other Ambulatory Visit (INDEPENDENT_AMBULATORY_CARE_PROVIDER_SITE_OTHER): Payer: Self-pay | Admitting: Family

## 2024-05-12 DIAGNOSIS — G43709 Chronic migraine without aura, not intractable, without status migrainosus: Secondary | ICD-10-CM

## 2024-05-12 DIAGNOSIS — T753XXA Motion sickness, initial encounter: Secondary | ICD-10-CM

## 2024-05-13 MED ORDER — TIZANIDINE HCL 4 MG OR TABS
ORAL_TABLET | ORAL | 0 refills | Status: AC
Start: 2024-05-13 — End: ?

## 2024-05-13 MED ORDER — SCOPOLAMINE 1 MG/3DAYS TD PT72
1.0000 | MEDICATED_PATCH | TRANSDERMAL | 0 refills | Status: AC | PRN
Start: 2024-05-13 — End: 2024-06-12

## 2024-05-13 NOTE — Telephone Encounter (Signed)
 This medication is outside of the Refill Center's protocols. Please sign and close the encounter if you approve:Tizanidine    If this medication is denied please have your staff inform the patient and schedule an appointment if necessary.

## 7992-10-22 DEATH — deceased
